# Patient Record
Sex: Female | Born: 1955
Health system: Southern US, Community
[De-identification: ages and names within clinical notes are randomized; demographics above are authoritative.]

## PROBLEM LIST (undated history)

## (undated) DIAGNOSIS — J449 Chronic obstructive pulmonary disease, unspecified: Secondary | ICD-10-CM

## (undated) DIAGNOSIS — E11319 Type 2 diabetes mellitus with unspecified diabetic retinopathy without macular edema: Secondary | ICD-10-CM

## (undated) DIAGNOSIS — E119 Type 2 diabetes mellitus without complications: Secondary | ICD-10-CM

## (undated) DIAGNOSIS — N19 Unspecified kidney failure: Secondary | ICD-10-CM

## (undated) DIAGNOSIS — E049 Nontoxic goiter, unspecified: Secondary | ICD-10-CM

## (undated) DIAGNOSIS — Z8489 Family history of other specified conditions: Secondary | ICD-10-CM

## (undated) DIAGNOSIS — H35039 Hypertensive retinopathy, unspecified eye: Secondary | ICD-10-CM

## (undated) DIAGNOSIS — H269 Unspecified cataract: Secondary | ICD-10-CM

## (undated) DIAGNOSIS — I10 Essential (primary) hypertension: Secondary | ICD-10-CM

## (undated) DIAGNOSIS — K219 Gastro-esophageal reflux disease without esophagitis: Secondary | ICD-10-CM

## (undated) DIAGNOSIS — E8770 Fluid overload, unspecified: Secondary | ICD-10-CM

## (undated) DIAGNOSIS — J45909 Unspecified asthma, uncomplicated: Secondary | ICD-10-CM

## (undated) DIAGNOSIS — D649 Anemia, unspecified: Secondary | ICD-10-CM

## (undated) DIAGNOSIS — M1990 Unspecified osteoarthritis, unspecified site: Secondary | ICD-10-CM

## (undated) HISTORY — PX: CHOLECYSTECTOMY: SHX55

## (undated) HISTORY — DX: Unspecified cataract: H26.9

## (undated) HISTORY — DX: Hypertensive retinopathy, unspecified eye: H35.039

## (undated) HISTORY — DX: Type 2 diabetes mellitus with unspecified diabetic retinopathy without macular edema: E11.319

## (undated) HISTORY — PX: ABDOMINAL HYSTERECTOMY: SHX81

---

## 2010-01-04 ENCOUNTER — Encounter: Payer: Self-pay | Admitting: Cardiology

## 2010-01-05 ENCOUNTER — Encounter: Payer: Self-pay | Admitting: Cardiology

## 2010-01-21 ENCOUNTER — Encounter (INDEPENDENT_AMBULATORY_CARE_PROVIDER_SITE_OTHER): Payer: Self-pay | Admitting: *Deleted

## 2010-01-21 ENCOUNTER — Ambulatory Visit: Payer: Self-pay | Admitting: Cardiology

## 2010-01-21 DIAGNOSIS — I10 Essential (primary) hypertension: Secondary | ICD-10-CM | POA: Insufficient documentation

## 2010-01-21 DIAGNOSIS — R079 Chest pain, unspecified: Secondary | ICD-10-CM | POA: Insufficient documentation

## 2010-01-21 DIAGNOSIS — E782 Mixed hyperlipidemia: Secondary | ICD-10-CM | POA: Insufficient documentation

## 2010-01-21 DIAGNOSIS — J45909 Unspecified asthma, uncomplicated: Secondary | ICD-10-CM | POA: Insufficient documentation

## 2010-01-26 ENCOUNTER — Encounter (INDEPENDENT_AMBULATORY_CARE_PROVIDER_SITE_OTHER): Payer: Self-pay | Admitting: *Deleted

## 2010-02-03 ENCOUNTER — Encounter: Payer: Self-pay | Admitting: Cardiology

## 2010-02-03 ENCOUNTER — Ambulatory Visit: Payer: Self-pay | Admitting: Cardiology

## 2010-02-04 ENCOUNTER — Encounter (INDEPENDENT_AMBULATORY_CARE_PROVIDER_SITE_OTHER): Payer: Self-pay | Admitting: *Deleted

## 2010-02-17 ENCOUNTER — Encounter: Payer: Self-pay | Admitting: Cardiology

## 2010-09-21 NOTE — Assessment & Plan Note (Signed)
Summary: NHP-REF: HOSPITALIST [place]   Visit Type:  Initial Consult Referring Provider:  Dr. Langley Gauss Primary Provider:  Dr. Emelda Fear   History of Present Illness: 55 year old woman referred for cardiology consultation. She was admitted to the hospitalist service at [facility] back in mid May with complaints of left arm discomfort, diaphoresis, and left-sided chest pain based on the available data. She ruled out for myocardial infarction with normal troponin levels and CK-MB levels. She was not seen in consultation by cardiology at that time, however was referred to see [place] for further outpatient assessment.  Review of a faxed copy of her ECG from May 16 finds sinus rhythm with nonspecific ST-T changes and decreased R wave progression.  In speaking with Ms. Wood today, she states that she developed a discomfort in her left wrist area, radiating up her arm, and ultimately experiencing left-sided chest discomfort and weakness. This occurred at rest, and concerned her to the point that she sought medical attention as detailed above. Since that time however she has not had same intensity symptoms, although occasionally a discomfort in her left wrist. She does have chronic dyspnea on exertion, reporting a long-standing history of asthma, trouble with heat, and also pollen. She reports that she has to use her nebulizer machine and inhalers on a regular basis.  She reports undergoing a standard treadmill test several years ago without obvious abnormalities. She has had no recent ischemic testing. She is functionally limited by her asthma, in fact disabled, and states that she would not be able to ambulate on a treadmill at this point with any endurance.  Preventive Screening-Counseling & Management  Alcohol-Tobacco     Smoking Status: quit     Year Quit: 1970  Current Medications (verified): 1)  Omeprazole 40 Mg Cpdr (Omeprazole) .... Take 1 Tablet By Mouth Once A Day 2)  Furosemide 40 Mg Tabs  (Furosemide) .... Take 1 Tablet By Mouth Once A Day 3)  Aspirin 81 Mg Chew (Aspirin) .... Take 1 Tablet By Mouth Once A Day 4)  Pravastatin Sodium 40 Mg Tabs (Pravastatin Sodium) .... Take 1 Tablet By Mouth Once A Day 5)  Diclofenac Sodium 75 Mg Tbec (Diclofenac Sodium) .... Take 1 Tablet By Mouth Two Times A Day 6)  Metformin Hcl 1000 Mg Tabs (Metformin Hcl) .... Take 1 Tablet By Mouth Two Times A Day 7)  Excedrin Migraine 250-250-65 Mg Tabs (Aspirin-Acetaminophen-Caffeine) .... As Needed 8)  Nitrostat 0.3 Mg Subl (Nitroglycerin) .... Use As Directed 9)  Lantus Solostar 100 Unit/ml Soln (Insulin Glargine) .... Use  As Directed 10)  Proair Hfa 108 (90 Base) Mcg/act Aers (Albuterol Sulfate) .... 2 Puffs Every Four Hours As Needed 11)  Albuterol Sulfate (2.5 Mg/26ml) 0.083% Nebu (Albuterol Sulfate) .... One Neb Tx Four Times A Day 12)  Novolog Mix 70/30 Flexpen 70-30 % Susp (Insulin Aspart Prot & Aspart) .... Use As Directed 13)  Ferrous Sulfate 325 (65 Fe) Mg Tabs (Ferrous Sulfate) .... Take 1 Tablet By Mouth Two Times A Day 14)  Alprazolam 0.5 Mg Tabs (Alprazolam) .... Take 1 Tablet By Mouth Three Times A Day As Needed Anxiety 15)  Drisdol 50000 Unit Caps (Ergocalciferol) .... Take One By Mouth Weekly 16)  Hydrocodone-Acetaminophen 5-500 Mg Tabs (Hydrocodone-Acetaminophen) .... Take 1 Tablet By Mouth Three Times A Day As Needed Pain  Allergies (verified): 1)  ! Niacin  Comments:  Nurse/Medical Assistant: The patient's medications and allergies were reviewed with the patient and were updated in the Medication and Allergy  Lists. Bottles reviewed.  Past History:  Past Medical History: Last updated: 01/20/2010 Anemia Diabetes Type 2 Hyperlipidemia Hypertension Gastric ulcer Migraines Asthma  Past Surgical History: Last updated: 01/20/2010 Cholecystectomy Hysterectomy  Family History: Last updated: 01/20/2010 Siblings: diabetes and hypertension Father: CAD Mother: CAD  Social  History: Last updated: 01/20/2010 Disabled  Married  Tobacco Use - No Alcohol Use - no Drug Use - no  Social History: Smoking Status:  quit  Review of Systems       The patient complains of chest pain, dyspnea on exertion, and peripheral edema.  The patient denies anorexia, fever, syncope, prolonged cough, headaches, hemoptysis, abdominal pain, melena, hematochezia, and severe indigestion/heartburn.         Otherwise reviewed and negative except as outlined.  Vital Signs:  Patient profile:   55 year old female Height:      62 inches Weight:      205 pounds BMI:     37.63 Pulse rate:   99 / minute BP sitting:   125 / 83  (left arm) Cuff size:   large  Vitals Entered By: Georgina Peer (January 21, 2010 10:15 AM)  Nutrition Counseling: Patient's BMI is greater than 25 and therefore counseled on weight management options.  Physical Exam  Additional Exam:  Obese woman in no acute distress without active chest pain. HEENT: Conjunctiva and lids are normal, oropharynx clear. Neck: Supple, no elevated JVP or bruits. Lungs: Clear auscultation although with diminished breath sounds and dry expiratory cough with deep inspiration. No active wheeze. Cardiac: Regular rate and rhythm, indistinct PMI, soft apical systolic murmur. Abdomen: Soft, nontender, bowel sounds present. Skin: Warm and dry. Extremities: Trace edema below the knees bilaterally, distal pulses are 1-2+. Musculoskeletal: No gross deformities. Neuropsychiatric: Alert and oriented x3, affect grossly appropriate.   Impression & Recommendations:  Problem # 1:  CHEST PAIN (ICD-786.50)  Patient indicates episode of left-sided chest pain and weakness, following the initial onset of left arm discomfort back in mid May as detailed above. She has had lesser intensity symptoms since that time on a baseline history of dyspnea on exertion with asthma. She does have a substantial cardiac risk factor profile including diabetes  mellitus, hypertension, and hyperlipidemia. She ruled out for myocardial infarction during hospitalization in May, and is referred now for further cardiac risk stratification. In light of her continued, although less intense symptoms, we discussed proceeding with additional objective testing, including both invasive and noninvasive techniques. We elected to proceed with a dobutamine Cardiolite as well as a 2-D echocardiogram for both ischemic and cardiac structural assessment. I will have her return to the office to discuss the results and we can proceed from there. At this point she will continue her present medications.  Her updated medication list for this problem includes:    Aspirin 81 Mg Chew (Aspirin) .Marland Kitchen... Take 1 tablet by mouth once a day    Nitrostat 0.3 Mg Subl (Nitroglycerin) ..... Use as directed  Orders: 2-D Echocardiogram (2D Echo) Nuclear Med (Nuc Med)  Problem # 2:  ASTHMA, UNSPECIFIED, UNSPECIFIED STATUS (ICD-493.90)  Apparent long-standing history, and functionally limiting. Patient states that she is disabled related to this. She doubts that she would be able to ambulate on a treadmill with any endurance, hence chemical stress testing as detailed above. She will otherwise continue her present pulmonary regimen on the direction of Dr. Chauncey Reading.  Her updated medication list for this problem includes:    Proair Hfa 108 (90 Base) Mcg/act Aers (  Albuterol sulfate) .Marland Kitchen... 2 puffs every four hours as needed    Albuterol Sulfate (2.5 Mg/61ml) 0.083% Nebu (Albuterol sulfate) ..... One neb tx four times a day  Orders: Nuclear Med ([place])  Problem # 3:  MIXED HYPERLIPIDEMIA (ICD-272.2)  Follow up with Dr. Chauncey Reading, tolerating pravastatin.  Her updated medication list for this problem includes:    Pravastatin Sodium 40 Mg Tabs (Pravastatin sodium) .Marland Kitchen... Take 1 tablet by mouth once a day  Orders: 2-D Echocardiogram (2D Echo) Nuclear Med (Nuc Med)  Problem # 4:  ESSENTIAL  HYPERTENSION, BENIGN (ICD-401.1)  Blood pressure reasonably well controlled today.  Her updated medication list for this problem includes:    Furosemide 40 Mg Tabs (Furosemide) .Marland Kitchen... Take 1 tablet by mouth once a day    Aspirin 81 Mg Chew (Aspirin) .Marland Kitchen... Take 1 tablet by mouth once a day  Orders: 2-D Echocardiogram (2D Echo) Nuclear Med ([place])  Patient Instructions: 1)  Your physician has requested that you have an echocardiogram.  Echocardiography is a painless test that uses sound waves to create images of your heart. It provides your doctor with information about the size and shape of your heart and how well your heart's chambers and valves are working.  This procedure takes approximately one hour. There are no restrictions for this procedure. If the results of your test are normal or stable, you will receive a letter. If they are abnormal, the nurse will contact you by phone.  2)  Your physician has requested that you have a dobutamine cardiolite.  For further information please visit [email or web address].  Please follow instruction sheet, as given. If the results of your test are normal or stable, you will receive a letter. If they are abnormal, the nurse will contact you by phone.  3)  Follow up appt with Dr. Domenic Polite on Wednesday, February 17, 2010 at 8:50am.

## 2010-09-21 NOTE — Letter (Signed)
Summary: Appointment -missed  [facility] [place]. [address]   [place], [place] [address]   Phone: [phone]  Fax: [phone]     February 17, 2010 MRN: [id number]     [facility]AR[facility] CALCOTE [address] [place]  [address]     Dear Ms. Chrissie Noa,  [facility] records indicate you missed your appointment on February 17, 2010                        with Dr.  Domenic Polite.   It is very important that we reach you to reschedule this appointment. We look forward to participating in your health care needs.   Please contact us at the number listed above at your earliest convenience to reschedule this appointment.   Sincerely,    Public relations account executive

## 2010-09-21 NOTE — Letter (Signed)
Summary: [facility], general practice at [place]. [address]   [place], [place] [address]   Phone: [phone]  Fax: [phone]    01/26/2010  [facility] [address] [place]  [address]  Dear Ms. Chrissie Noa,  I was unable to reach you by telephone. I have re-scheduled your test for February 03, 2010. Enclosed you will find your orders. If this date and time are not suitable please call our office and we will be happy to re-schedule.           Sincerely,   Delfino Lovett Patient Care Coordinator [phone] ext. [place]

## 2010-09-21 NOTE — Letter (Signed)
Summary: [facility] H&P/ D/C (DR. PARSONS)  [facility] H&P/ D/C (DR. PARSONS)   Imported By: Delfino Lovett 01/20/2010 12:13:19  _____________________________________________________________________  External Attachment:    Type:   Image     Comment:   External Document

## 2010-09-21 NOTE — Letter (Signed)
Summary: Risk analyst at [place]. [address] [place], [place] [address]   Phone: [phone]  Fax: [phone]        February 04, 2010 MRN: [id number]    [facility] [address] [place]  [address]    Dear Ms. Chrissie Noa,  Your test ordered by Rande Lawman has been reviewed by your physician (or physician assistant) and was found to be normal or stable. Your physician (or physician assistant) felt no changes were needed at this time.  __X__ Echocardiogram  __X__ Cardiac Stress Test-WE WILL DISCUSS RESULTS FURTHER AT YOUR FOLLOW UP VISIT IN 2 WKS.  ____ Lab Work  ____ Peripheral vascular study of arms, legs or neck  ____ CT scan or X-ray  ____ Lung or Breathing test  ____ Other:   Thank you.   Gurney Maxin, RN, BSN    Bryon Lions, M.D., F.A.C.C. Maceo Pro, M.D., F.A.C.C. Cammy Copa, M.D., F.A.C.C. Vonda Antigua, M.D., F.A.C.C. Vita Barley, M.D., F.A.C.C. Mare Ferrari, M.D., F.A.C.C. Emi Belfast, PA-C

## 2010-09-21 NOTE — Letter (Signed)
Summary: Lexiscan or Dobutamine Adult nurse at [place] [place]. [address]   [place], [place] [address]   Phone: [phone]  Fax: [phone]      [place] or Dobutamine Cardiolite Strss Test    [facility]  Appointment Date:_  Appointment Time:_  Your doctor has ordered a CARDIOLITE STRESS TEST using a medication to stimulate exercise so that you will not have to walk on the treadmill to determine the condition of your heart during stress. If you take blood pressure medication, ask your doctor if you should take it the day of your test. You should not have anything to eat or drink at least 4 hours before your test is scheduled, and no caffeine, including decaffeinated tea and coffee, chocolate, and soft drinks for 24 hours before your test.  You will need to register at the Outpatient/Main Entrance at the hospital 15 minutes before your appointment time. It is a good idea to bring a copy of your order with you. They will direct you to the Diagnostic Imaging (Radiology) Department.  You will be asked to undress from the waist up and given a hospital gown to wear, so dress comfortably from the waist down for example: Sweat pants, shorts, or skirt Rubber soled lace up shoes (tennis shoes)  Plan on about three hours from registration to release from the hospital  Hold furosemide (Lasix) and diabetic medications am of test.

## 2013-04-17 DIAGNOSIS — R0602 Shortness of breath: Secondary | ICD-10-CM

## 2014-03-05 ENCOUNTER — Emergency Department (HOSPITAL_COMMUNITY)
Admission: EM | Admit: 2014-03-05 | Discharge: 2014-03-05 | Disposition: A | Discharge status: Home / Self Care | Payer: Medicare HMO | Attending: Emergency Medicine | Admitting: Emergency Medicine

## 2014-03-05 ENCOUNTER — Encounter (HOSPITAL_COMMUNITY): Payer: Self-pay | Admitting: Emergency Medicine

## 2014-03-05 DIAGNOSIS — J45909 Unspecified asthma, uncomplicated: Secondary | ICD-10-CM | POA: Diagnosis not present

## 2014-03-05 DIAGNOSIS — H5789 Other specified disorders of eye and adnexa: Secondary | ICD-10-CM | POA: Diagnosis present

## 2014-03-05 DIAGNOSIS — H1130 Conjunctival hemorrhage, unspecified eye: Secondary | ICD-10-CM | POA: Diagnosis not present

## 2014-03-05 DIAGNOSIS — Z87448 Personal history of other diseases of urinary system: Secondary | ICD-10-CM | POA: Insufficient documentation

## 2014-03-05 DIAGNOSIS — I10 Essential (primary) hypertension: Secondary | ICD-10-CM | POA: Insufficient documentation

## 2014-03-05 DIAGNOSIS — Z862 Personal history of diseases of the blood and blood-forming organs and certain disorders involving the immune mechanism: Secondary | ICD-10-CM | POA: Insufficient documentation

## 2014-03-05 DIAGNOSIS — H1132 Conjunctival hemorrhage, left eye: Secondary | ICD-10-CM

## 2014-03-05 HISTORY — DX: Fluid overload, unspecified: E87.70

## 2014-03-05 HISTORY — DX: Unspecified kidney failure: N19

## 2014-03-05 HISTORY — DX: Unspecified asthma, uncomplicated: J45.909

## 2014-03-05 HISTORY — DX: Essential (primary) hypertension: I10

## 2014-03-05 HISTORY — DX: Anemia, unspecified: D64.9

## 2014-03-05 NOTE — Discharge Instructions (Signed)
Subconjunctival Hemorrhage A subconjunctival hemorrhage is a bright red patch covering a portion of the white of the eye. The white part of the eye is called the sclera, and it is covered by a thin membrane called the conjunctiva. This membrane is clear, except for tiny blood vessels that you can see with the naked eye. When your eye is irritated or inflamed and becomes red, it is because the vessels in the conjunctiva are swollen. Sometimes, a blood vessel in the conjunctiva can break and bleed. When this occurs, the blood builds up between the conjunctiva and the sclera, and spreads out to create a red area. The red spot may be very small at first. It may then spread to cover a larger part of the surface of the eye, or even all of the visible white part of the eye. In almost all cases, the blood will go away and the eye will become white again. Before completely dissolving, however, the red area may spread. It may also become brownish-yellow in color, before going away. If a lot of blood collects under the conjunctiva, it may look like a bulge on the surface of the eye. This looks scary, but it will also eventually flatten out and go away. Subconjunctival hemorrhages do not cause pain, but if swollen, may cause a feeling of irritation. There is no effect on vision.  CAUSES   The most common cause is mild trauma (rubbing the eye, irritation).  Subconjunctival hemorrhages can happen because of coughing or straining (lifting heavy objects), vomiting, or sneezing.  In some cases, your doctor may want to check your blood pressure. High blood pressure can also cause a sunconjunctival hemorrhage.  Severe trauma or blunt injuries.  Diseases that affect blood clotting (hemophilia, leukemia).  Abnormalities of blood vessels behind the eye (carotid cavernous sinus fistula).  Tumors behind the eye.  Certain drugs (aspirin, coumadin, heparin).  Recent eye surgery. HOME CARE INSTRUCTIONS   Do not worry  about the appearance of your eye. You may continue your usual activities.  Often, follow-up is not necessary. SEEK MEDICAL CARE IF:   Your eye becomes painful.  The bleeding does not disappear within 3 weeks.  Bleeding occurs elsewhere, for example, under the skin, in the mouth, or in the other eye.  You have recurring subconjunctival hemorrhages. SEEK IMMEDIATE MEDICAL CARE IF:   Your vision changes or you have difficulty seeing.  You develop severe headache, persistent vomiting, confusion, or abnormal drowsiness (lethargy).  Your eye seems to bulge or protrude from the eye socket.  You notice the sudden appearance of bruises, or have spontaneous bleeding elsewhere on your body. Document Released: 08/08/2005 Document Revised: 10/31/2011 Document Reviewed: 07/06/2009 [facility] Patient Information 2015 [place], [place]. This information is not intended to replace advice given to you by your health care provider. Make sure you discuss any questions you have with your health care provider.

## 2014-03-05 NOTE — ED Notes (Signed)
Sunday morning patient woke with left eye redness, progressively worse. Denies pain, blurred vision, itchy. Pt states feel "little different". Mild swelling left upper cheek.

## 2014-03-05 NOTE — ED Provider Notes (Signed)
TIME SEEN: 5:17 PM  CHIEF COMPLAINT: Left eye redness  HPI: Patient is a 58 y.o. F with history of hypertension, asthma who presents to the emergency department with complaints of left eye redness that started on Sunday, 3 days ago. She denies any eye pain, trauma to eye, vision changes including blurry vision or vision loss, headaches, nausea or vomiting. No drainage from the eye. She is not on anticoagulation. She is not sure what her blood pressure runs but it is 155/84 the emergency department. She states she has been stressed out recently and crying more than normal because her sister recently died.  ROS: See HPI Constitutional: no fever  Eyes: no drainage  ENT: no runny nose   Cardiovascular:  no chest pain  Resp: no SOB  GI: no vomiting GU: no dysuria Integumentary: no rash  Allergy: no hives  Musculoskeletal: no leg swelling  Neurological: no slurred speech ROS otherwise negative  PAST MEDICAL HISTORY/PAST SURGICAL HISTORY:  Past Medical History  Diagnosis Date  . Asthma   . Hypertension   . Anemia   . Fluid excess   . Renal failure     MEDICATIONS:  Prior to Admission medications   Not on File    ALLERGIES:  Allergies  Allergen Reactions  . Niacin     REACTION: burning    SOCIAL HISTORY:  History  Substance Use Topics  . Smoking status: Never Smoker   . Smokeless tobacco: Not on file  . Alcohol Use: No    FAMILY HISTORY: No family history on file.  EXAM: BP 155/84  Pulse 92  Temp(Src) 98.5 F (36.9 C) (Oral)  Resp 18  Ht 5\' 6"  (1.676 m)  Wt 205 lb (92.987 kg)  BMI 33.10 kg/m2  SpO2 100% CONSTITUTIONAL: Alert and oriented and responds appropriately to questions. Well-appearing; well-nourished HEAD: Normocephalic EYES: Conjunctivae clear, PERRL, extraocular movements intact and painless, no photophobia, no pain with consensual light response, patient has a subconjunctival hemorrhage of the left medial eye that is spreading underneath and above  the iris, no hyphema, normal visual fields ENT: normal nose; no rhinorrhea; moist mucous membranes; pharynx without lesions noted NECK: Supple, no meningismus, no LAD  CARD: RRR; S1 and S2 appreciated; no murmurs, no clicks, no rubs, no gallops RESP: Normal chest excursion without splinting or tachypnea; breath sounds clear and equal bilaterally; no wheezes, no rhonchi, no rales,  ABD/GI: Normal bowel sounds; non-distended; soft, non-tender, no rebound, no guarding BACK:  The back appears normal and is non-tender to palpation, there is no CVA tenderness EXT: Normal ROM in all joints; non-tender to palpation; no edema; normal capillary refill; no cyanosis    SKIN: Normal color for age and race; warm NEURO: Moves all extremities equally PSYCH: The patient's mood and manner are appropriate. Grooming and personal hygiene are appropriate.  MEDICAL DECISION MAKING: Patient here with subconjunctival hemorrhage. She is not on anticoagulation. Denies any eye pain, vision changes, headache, nausea or vomiting. Doubt glaucoma, retinal detachment, central retinal vessel occlusion. Doubt conjunctivitis. Doubt corneal abrasion or ulcer given she has no eye pain. She only wears reading glasses. No contacts. Have discussed with patient that this will take time to improve. Her blood pressure here in the emergency Department stable. She has not had any trauma to eye. A sign of globe injury on exam. No hyphema. I feel she is safe to be discharged home. We'll give ophthalmology followup as needed. Have discussed supportive care instructions and strict return precautions. Patient verbalizes understanding  and is comfortable with plan.    [place], DO 03/05/14 1725

## 2014-03-05 NOTE — ED Notes (Addendum)
Pt denies any known injury,fever, or changes in vision.

## 2014-05-25 ENCOUNTER — Emergency Department (HOSPITAL_COMMUNITY): Payer: Commercial Managed Care - HMO

## 2014-05-25 ENCOUNTER — Encounter (HOSPITAL_COMMUNITY): Payer: Self-pay | Admitting: Emergency Medicine

## 2014-05-25 ENCOUNTER — Emergency Department (HOSPITAL_COMMUNITY)
Admission: EM | Admit: 2014-05-25 | Discharge: 2014-05-26 | Disposition: A | Discharge status: Home / Self Care | Payer: Commercial Managed Care - HMO | Attending: Emergency Medicine | Admitting: Emergency Medicine

## 2014-05-25 DIAGNOSIS — Z79899 Other long term (current) drug therapy: Secondary | ICD-10-CM | POA: Diagnosis not present

## 2014-05-25 DIAGNOSIS — R110 Nausea: Secondary | ICD-10-CM | POA: Diagnosis not present

## 2014-05-25 DIAGNOSIS — R600 Localized edema: Secondary | ICD-10-CM | POA: Diagnosis not present

## 2014-05-25 DIAGNOSIS — Z87448 Personal history of other diseases of urinary system: Secondary | ICD-10-CM | POA: Insufficient documentation

## 2014-05-25 DIAGNOSIS — D649 Anemia, unspecified: Secondary | ICD-10-CM | POA: Insufficient documentation

## 2014-05-25 DIAGNOSIS — Z7982 Long term (current) use of aspirin: Secondary | ICD-10-CM | POA: Insufficient documentation

## 2014-05-25 DIAGNOSIS — Z794 Long term (current) use of insulin: Secondary | ICD-10-CM | POA: Diagnosis not present

## 2014-05-25 DIAGNOSIS — I10 Essential (primary) hypertension: Secondary | ICD-10-CM | POA: Diagnosis not present

## 2014-05-25 DIAGNOSIS — E119 Type 2 diabetes mellitus without complications: Secondary | ICD-10-CM | POA: Diagnosis not present

## 2014-05-25 DIAGNOSIS — J4521 Mild intermittent asthma with (acute) exacerbation: Secondary | ICD-10-CM | POA: Diagnosis not present

## 2014-05-25 DIAGNOSIS — R0602 Shortness of breath: Secondary | ICD-10-CM | POA: Diagnosis present

## 2014-05-25 DIAGNOSIS — H539 Unspecified visual disturbance: Secondary | ICD-10-CM | POA: Insufficient documentation

## 2014-05-25 LAB — CBC WITH DIFFERENTIAL/PLATELET
Basophils Absolute: 0 10*3/uL (ref 0.0–0.1)
Basophils Relative: 0 % (ref 0–1)
Eosinophils Absolute: 0.1 10*3/uL (ref 0.0–0.7)
Eosinophils Relative: 1 % (ref 0–5)
HEMATOCRIT: 32.1 % — AB (ref 36.0–46.0)
HEMOGLOBIN: 11 g/dL — AB (ref 12.0–15.0)
LYMPHS PCT: 48 % — AB (ref 12–46)
Lymphs Abs: 3.7 10*3/uL (ref 0.7–4.0)
MCH: 27 pg (ref 26.0–34.0)
MCHC: 34.3 g/dL (ref 30.0–36.0)
MCV: 78.7 fL (ref 78.0–100.0)
MONO ABS: 0.3 10*3/uL (ref 0.1–1.0)
MONOS PCT: 3 % (ref 3–12)
NEUTROS ABS: 3.6 10*3/uL (ref 1.7–7.7)
Neutrophils Relative %: 48 % (ref 43–77)
Platelets: 252 10*3/uL (ref 150–400)
RBC: 4.08 MIL/uL (ref 3.87–5.11)
RDW: 13.5 % (ref 11.5–15.5)
WBC: 7.6 10*3/uL (ref 4.0–10.5)

## 2014-05-25 LAB — BASIC METABOLIC PANEL
Anion gap: 12 (ref 5–15)
BUN: 19 mg/dL (ref 6–23)
CO2: 25 meq/L (ref 19–32)
CREATININE: 1.41 mg/dL — AB (ref 0.50–1.10)
Calcium: 9.3 mg/dL (ref 8.4–10.5)
Chloride: 100 mEq/L (ref 96–112)
GFR calc Af Amer: 47 mL/min — ABNORMAL LOW (ref >90)
GFR calc non Af Amer: 41 mL/min — ABNORMAL LOW (ref >90)
Glucose, Bld: 253 mg/dL — ABNORMAL HIGH (ref 70–99)
Potassium: 3.6 mEq/L — ABNORMAL LOW (ref 3.7–5.3)
Sodium: 137 mEq/L (ref 137–147)

## 2014-05-25 LAB — CBG MONITORING, ED: Glucose-Capillary: 285 mg/dL — ABNORMAL HIGH (ref 70–99)

## 2014-05-25 MED ORDER — PREDNISONE 10 MG PO TABS: 40.0000 mg | ORAL_TABLET | Freq: Every day | ORAL | Status: DC

## 2014-05-25 MED ORDER — PREDNISONE 50 MG PO TABS
60.0000 mg | ORAL_TABLET | Freq: Once | ORAL | Status: AC
  Administered 2014-05-25: 60 mg via ORAL
  Filled 2014-05-25 (×2): qty 1

## 2014-05-25 NOTE — ED Provider Notes (Signed)
CSN: [id number]     Arrival date & time 05/25/14  1952 History  This chart was scribed for Fredia Sorrow, MD by Molli Posey, ED Scribe. This patient was seen in room APA06/[facility]06 and the patient's care was started 9:41 PM.   Chief Complaint  Patient presents with  . Shortness of Breath     The history is provided by the patient. No language interpreter was used.   HPI Comments: Cindy Park is a 58 y.o. female with a history of asthma and insulin dependent DM who presents to the Emergency Department complaining of shortness of breath for the past 2 weeks. The patient reports having subjective fevers and chills and an unproductive cough. She reports nausea and light headedness as associated symptoms that started today. She states that she has a nebulizer and a puffer for her albuterol prescription at home. She reports she has taken Prednisone for similar, previous events and that it provided relief to her symptoms. She denies diarrhea and vomiting.      Past Medical History  Diagnosis Date  . Asthma   . Hypertension   . Anemia   . Fluid excess   . Renal failure    Past Surgical History  Procedure Laterality Date  . Cholecystectomy    . Abdominal hysterectomy     History reviewed. No pertinent family history. History  Substance Use Topics  . Smoking status: Never Smoker   . Smokeless tobacco: Not on file  . Alcohol Use: No   OB History   Grav Para Term Preterm Abortions TAB SAB Ect Mult Living                 Review of Systems  Constitutional: Positive for fever and chills.  HENT: Negative for rhinorrhea and sore throat.   Eyes: Positive for visual disturbance.  Respiratory: Positive for cough and shortness of breath.   Cardiovascular: Positive for leg swelling. Negative for chest pain.  Gastrointestinal: Positive for nausea. Negative for vomiting, abdominal pain and diarrhea.  Genitourinary: Negative for dysuria.  Musculoskeletal: Negative for back pain and  neck pain.  Skin: Negative for rash.  Neurological: Positive for headaches.  Hematological: Does not bruise/bleed easily.  Psychiatric/Behavioral: Negative for confusion.      Allergies  Niacin  Home Medications   Prior to Admission medications   Medication Sig Start Date End Date Taking? Authorizing Provider  albuterol (PROVENTIL) (2.5 MG/3ML) 0.083% nebulizer solution Take 2.5 mg by nebulization 4 (four) times daily.   Yes Historical Provider, MD  aspirin EC 81 MG tablet Take 81 mg by mouth daily.   Yes Historical Provider, MD  atorvastatin (LIPITOR) 40 MG tablet Take 40 mg by mouth at bedtime.   Yes Historical Provider, MD  dicyclomine (BENTYL) 10 MG capsule Take 10 mg by mouth daily.   Yes Historical Provider, MD  fenofibrate (TRICOR) 145 MG tablet Take 145 mg by mouth daily.   Yes Historical Provider, MD  ferrous sulfate 325 (65 FE) MG tablet Take 325 mg by mouth 2 (two) times daily with a meal.   Yes Historical Provider, MD  furosemide (LASIX) 40 MG tablet Take 40 mg by mouth every Monday, Wednesday, Friday, and Saturday at 6 PM.   Yes Historical Provider, MD  gabapentin (NEURONTIN) 300 MG capsule Take 300 mg by mouth 3 (three) times daily.   Yes Historical Provider, MD  glipiZIDE (GLUCOTROL) 10 MG tablet Take 10 mg by mouth 2 (two) times daily.   Yes Historical Provider, MD  insulin aspart protamine- aspart (NOVOLOG MIX 70/30) (70-30) 100 UNIT/ML injection Inject 30 Units into the skin 2 (two) times daily.   Yes Historical Provider, MD  insulin detemir (LEVEMIR) 100 UNIT/ML injection Inject 45 Units into the skin at bedtime.   Yes Historical Provider, MD  losartan (COZAAR) 100 MG tablet Take 100 mg by mouth daily.   Yes Historical Provider, MD  Magnesium Hydroxide (MAGNESIA PO) Take 1 tablet by mouth daily.   Yes Historical Provider, MD  montelukast (SINGULAIR) 10 MG tablet Take 10 mg by mouth at bedtime.   Yes Historical Provider, MD  omeprazole (PRILOSEC) 40 MG capsule Take 40  mg by mouth daily.   Yes Historical Provider, MD  potassium chloride (K-DUR) 10 MEQ tablet Take 20 mEq by mouth daily.   Yes Historical Provider, MD  tiotropium (SPIRIVA) 18 MCG inhalation capsule Place 18 mcg into inhaler and inhale daily.   Yes Historical Provider, MD  Vitamin D, Ergocalciferol, (DRISDOL) 50000 UNITS CAPS capsule Take 50,000 Units by mouth every 7 (seven) days. Takes on Sunday   Yes Historical Provider, MD  predniSONE (DELTASONE) 10 MG tablet Take 4 tablets (40 mg total) by mouth daily. 05/25/14   Fredia Sorrow, MD   BP 151/79  Pulse 85  Temp(Src) 98.5 F (36.9 C) (Oral)  Resp 24  Ht 5\' 7"  (1.702 m)  Wt 225 lb 1.6 oz (102.105 kg)  BMI 35.25 kg/m2  SpO2 99% Physical Exam  Nursing note and vitals reviewed. Constitutional: She is oriented to person, place, and time. She appears well-developed and well-nourished.  HENT:  Head: Normocephalic and atraumatic.  Cardiovascular: Normal rate, regular rhythm and normal heart sounds.   Pulmonary/Chest: Effort normal and breath sounds normal. No respiratory distress. She has no wheezes. She has no rales.  Abdominal: Bowel sounds are normal. She exhibits no distension. There is no tenderness.  Musculoskeletal: Normal range of motion. She exhibits edema.  Neurological: She is alert and oriented to person, place, and time.  Skin: Skin is warm and dry.  Psychiatric: She has a normal mood and affect.    ED Course  Procedures  DIAGNOSTIC STUDIES: Oxygen Saturation is 98% on RA, normal by my interpretation.    COORDINATION OF CARE: 9:47 PM Discussed treatment plan with pt at bedside and pt agreed to plan.   Labs Review Labs Reviewed  BASIC METABOLIC PANEL - Abnormal; Notable for the following:    Potassium 3.6 (*)    Glucose, Bld 253 (*)    Creatinine, Ser 1.41 (*)    GFR calc non Af Amer 41 (*)    GFR calc Af Amer 47 (*)    All other components within normal limits  CBC WITH DIFFERENTIAL - Abnormal; Notable for the  following:    Hemoglobin 11.0 (*)    HCT 32.1 (*)    Lymphocytes Relative 48 (*)    All other components within normal limits  CBG MONITORING, ED - Abnormal; Notable for the following:    Glucose-Capillary 285 (*)    All other components within normal limits   Results for orders placed during the hospital encounter of [id number]  BASIC METABOLIC PANEL      Result Value Ref Range   Sodium 137  137 - 147 mEq/L   Potassium 3.6 (*) 3.7 - 5.3 mEq/L   Chloride 100  96 - 112 mEq/L   CO2 25  19 - 32 mEq/L   Glucose, Bld 253 (*) 70 - 99 mg/dL   BUN 19  6 - 23 mg/dL   Creatinine, Ser 1.41 (*) 0.50 - 1.10 mg/dL   Calcium 9.3  8.4 - 10.5 mg/dL   GFR calc non Af Amer 41 (*) >90 mL/min   GFR calc Af Amer 47 (*) >90 mL/min   Anion gap 12  5 - 15  CBC WITH DIFFERENTIAL      Result Value Ref Range   WBC 7.6  4.0 - 10.5 K/uL   RBC 4.08  3.87 - 5.11 MIL/uL   Hemoglobin 11.0 (*) 12.0 - 15.0 g/dL   HCT 32.1 (*) 36.0 - 46.0 %   MCV 78.7  78.0 - 100.0 fL   MCH 27.0  26.0 - 34.0 pg   MCHC 34.3  30.0 - 36.0 g/dL   RDW 13.5  11.5 - 15.5 %   Platelets 252  150 - 400 K/uL   Neutrophils Relative % 48  43 - 77 %   Neutro Abs 3.6  1.7 - 7.7 K/uL   Lymphocytes Relative 48 (*) 12 - 46 %   Lymphs Abs 3.7  0.7 - 4.0 K/uL   Monocytes Relative 3  3 - 12 %   Monocytes Absolute 0.3  0.1 - 1.0 K/uL   Eosinophils Relative 1  0 - 5 %   Eosinophils Absolute 0.1  0.0 - 0.7 K/uL   Basophils Relative 0  0 - 1 %   Basophils Absolute 0.0  0.0 - 0.1 K/uL  CBG MONITORING, ED      Result Value Ref Range   Glucose-Capillary 285 (*) 70 - 99 mg/dL   Comment 1 Documented in Chart       Imaging Review Dg Chest 2 View  05/25/2014   CLINICAL DATA:  Shortness of breath with cough and sore throat as well as intermittent fever 2 weeks.  EXAM: CHEST  2 VIEW  COMPARISON:  09/03/2013 and 04/17/2013  FINDINGS: Lungs are hypoinflated with minimal linear density over the left midlung unchanged compatible with scarring. No evidence  of effusion. Cardiomediastinal silhouette is within normal. There is minimal calcified plaque over the thoracic aorta. There is mild degenerative change of the spine.  IMPRESSION: No active cardiopulmonary disease.   Electronically Signed   By: Marin Olp M.D.   On: 05/25/2014 21:02     EKG Interpretation None      MDM   Final diagnoses:  Asthma, mild intermittent, with acute exacerbation   Patient with history of diabetes and asthma. Patient without any wheezing here but by her description at home it sounds as if there's been an exacerbation of her asthma. Patient does have albuterol nebulizer albuterol treatments available at home. Will start patient on prednisone for she says when she feels like this usually helps. Patient is aware that this could cause a bump in her blood sugar. Blood sugar here tonight to 250 range not significantly elevated but the prednisone could increase that. Patient will followup with her record Dr. in the next few days. Patient will follow her blood sugars carefully. Patient will return for any newer worse symptoms. Chest x-ray negative no signs of pneumonia no signs of pulmonary edema or pleural effusion.   I personally performed the services described in this documentation, which was scribed in my presence. The recorded information has been reviewed and is accurate.     Fredia Sorrow, MD 05/26/14 0000

## 2014-05-25 NOTE — Discharge Instructions (Signed)
Workup negative except for your feelings shortness of breath which she felt was related to your asthma. Continue albuterol treatments every 6 hours. We'll start you on prednisone first dose provided here. Be aware that prednisone will probably bump your blood sugars. Follow them carefully. Make an appointment to followup with your regular Dr. in [place] in the next few days. Return for any newer worse symptoms.

## 2014-05-25 NOTE — ED Notes (Signed)
Pt c/o sob with cough and feeling of hotness and coldness x 2 weeks

## 2014-05-26 NOTE — ED Notes (Signed)
Pt alert & oriented x4, stable gait. Patient given discharge instructions, paperwork & prescription(s). Patient  instructed to stop at the registration desk to finish any additional paperwork. Patient verbalized understanding. Pt left department w/ no further questions. 

## 2014-06-23 ENCOUNTER — Telehealth: Payer: Self-pay | Admitting: Family Medicine

## 2014-06-23 NOTE — Telephone Encounter (Signed)
Pt given appt with dr Sabra Heck 08/27/13 for new pt to establish care

## 2014-08-27 ENCOUNTER — Ambulatory Visit: Payer: Self-pay | Admitting: Family Medicine

## 2015-01-30 ENCOUNTER — Ambulatory Visit: Payer: Commercial Managed Care - HMO | Admitting: *Deleted

## 2015-03-04 ENCOUNTER — Ambulatory Visit: Payer: Commercial Managed Care - HMO | Admitting: *Deleted

## 2015-03-12 ENCOUNTER — Ambulatory Visit: Payer: Commercial Managed Care - HMO | Admitting: *Deleted

## 2015-04-09 ENCOUNTER — Ambulatory Visit: Payer: Commercial Managed Care - HMO | Admitting: *Deleted

## 2019-06-21 ENCOUNTER — Other Ambulatory Visit: Payer: Self-pay

## 2019-06-21 DIAGNOSIS — Z20822 Contact with and (suspected) exposure to covid-19: Secondary | ICD-10-CM

## 2019-06-22 LAB — NOVEL CORONAVIRUS, NAA: SARS-CoV-2, NAA: NOT DETECTED

## 2019-07-12 DIAGNOSIS — N1832 Chronic kidney disease, stage 3b: Secondary | ICD-10-CM | POA: Insufficient documentation

## 2019-07-12 DIAGNOSIS — E1122 Type 2 diabetes mellitus with diabetic chronic kidney disease: Secondary | ICD-10-CM | POA: Insufficient documentation

## 2019-07-24 ENCOUNTER — Other Ambulatory Visit (HOSPITAL_COMMUNITY): Payer: Self-pay | Admitting: Nephrology

## 2019-07-24 DIAGNOSIS — R809 Proteinuria, unspecified: Secondary | ICD-10-CM

## 2019-07-24 DIAGNOSIS — N189 Chronic kidney disease, unspecified: Secondary | ICD-10-CM

## 2019-08-01 ENCOUNTER — Encounter (HOSPITAL_COMMUNITY): Payer: Self-pay

## 2019-08-01 ENCOUNTER — Ambulatory Visit (HOSPITAL_COMMUNITY): Payer: Medicare HMO | Attending: Nephrology

## 2019-08-20 ENCOUNTER — Other Ambulatory Visit: Payer: Self-pay

## 2019-08-20 ENCOUNTER — Ambulatory Visit (HOSPITAL_COMMUNITY)
Admission: RE | Admit: 2019-08-20 | Discharge: 2019-08-20 | Disposition: A | Discharge status: Home / Self Care | Payer: Medicare HMO | Source: Ambulatory Visit | Attending: Nephrology | Admitting: Nephrology

## 2019-08-20 DIAGNOSIS — R809 Proteinuria, unspecified: Secondary | ICD-10-CM | POA: Diagnosis present

## 2019-08-20 DIAGNOSIS — N189 Chronic kidney disease, unspecified: Secondary | ICD-10-CM

## 2019-08-28 DIAGNOSIS — E211 Secondary hyperparathyroidism, not elsewhere classified: Secondary | ICD-10-CM | POA: Diagnosis not present

## 2019-08-28 DIAGNOSIS — E559 Vitamin D deficiency, unspecified: Secondary | ICD-10-CM | POA: Diagnosis not present

## 2019-08-28 DIAGNOSIS — R809 Proteinuria, unspecified: Secondary | ICD-10-CM | POA: Diagnosis not present

## 2019-08-28 DIAGNOSIS — N189 Chronic kidney disease, unspecified: Secondary | ICD-10-CM | POA: Diagnosis not present

## 2019-08-28 DIAGNOSIS — E872 Acidosis: Secondary | ICD-10-CM | POA: Diagnosis not present

## 2019-09-09 ENCOUNTER — Other Ambulatory Visit: Payer: Self-pay

## 2019-09-09 ENCOUNTER — Encounter (HOSPITAL_COMMUNITY)
Admission: RE | Admit: 2019-09-09 | Discharge: 2019-09-09 | Disposition: A | Discharge status: Home / Self Care | Payer: Medicare Other | Source: Ambulatory Visit | Attending: Nephrology | Admitting: Nephrology

## 2019-09-09 ENCOUNTER — Encounter (HOSPITAL_COMMUNITY): Payer: Self-pay

## 2019-09-09 DIAGNOSIS — D631 Anemia in chronic kidney disease: Secondary | ICD-10-CM | POA: Diagnosis not present

## 2019-09-09 DIAGNOSIS — N184 Chronic kidney disease, stage 4 (severe): Secondary | ICD-10-CM | POA: Insufficient documentation

## 2019-09-09 HISTORY — DX: Gastro-esophageal reflux disease without esophagitis: K21.9

## 2019-09-09 HISTORY — DX: Nontoxic goiter, unspecified: E04.9

## 2019-09-09 HISTORY — DX: Type 2 diabetes mellitus without complications: E11.9

## 2019-09-09 HISTORY — DX: Unspecified osteoarthritis, unspecified site: M19.90

## 2019-09-09 HISTORY — DX: Chronic obstructive pulmonary disease, unspecified: J44.9

## 2019-09-09 LAB — POCT HEMOGLOBIN-HEMACUE: Hemoglobin: 8.9 g/dL — ABNORMAL LOW (ref 12.0–15.0)

## 2019-09-09 MED ORDER — EPOETIN ALFA 3000 UNIT/ML IJ SOLN
3000.0000 [IU] | INTRAMUSCULAR | Status: DC
  Administered 2019-09-09: 3000 [IU] via SUBCUTANEOUS
  Filled 2019-09-09: qty 1

## 2019-09-09 NOTE — Discharge Instructions (Signed)

## 2019-09-10 DIAGNOSIS — Z87891 Personal history of nicotine dependence: Secondary | ICD-10-CM | POA: Diagnosis not present

## 2019-09-10 DIAGNOSIS — Z299 Encounter for prophylactic measures, unspecified: Secondary | ICD-10-CM | POA: Diagnosis not present

## 2019-09-10 DIAGNOSIS — E11649 Type 2 diabetes mellitus with hypoglycemia without coma: Secondary | ICD-10-CM | POA: Diagnosis not present

## 2019-09-10 DIAGNOSIS — I10 Essential (primary) hypertension: Secondary | ICD-10-CM | POA: Diagnosis not present

## 2019-09-23 ENCOUNTER — Encounter (HOSPITAL_COMMUNITY)
Admission: RE | Admit: 2019-09-23 | Discharge: 2019-09-23 | Disposition: A | Discharge status: Home / Self Care | Payer: Medicare Other | Source: Ambulatory Visit | Attending: Nephrology | Admitting: Nephrology

## 2019-09-23 ENCOUNTER — Other Ambulatory Visit: Payer: Self-pay

## 2019-09-23 ENCOUNTER — Encounter (HOSPITAL_COMMUNITY): Payer: Self-pay

## 2019-09-23 DIAGNOSIS — N184 Chronic kidney disease, stage 4 (severe): Secondary | ICD-10-CM | POA: Diagnosis not present

## 2019-09-23 DIAGNOSIS — D631 Anemia in chronic kidney disease: Secondary | ICD-10-CM | POA: Diagnosis not present

## 2019-09-23 LAB — POCT HEMOGLOBIN-HEMACUE: Hemoglobin: 9.5 g/dL — ABNORMAL LOW (ref 12.0–15.0)

## 2019-09-23 MED ORDER — EPOETIN ALFA 3000 UNIT/ML IJ SOLN
3000.0000 [IU] | Freq: Once | INTRAMUSCULAR | Status: AC
  Administered 2019-09-23: 3000 [IU] via SUBCUTANEOUS

## 2019-09-23 MED ORDER — EPOETIN ALFA 3000 UNIT/ML IJ SOLN
INTRAMUSCULAR | Status: AC
  Filled 2019-09-23: qty 1

## 2019-09-25 DIAGNOSIS — E119 Type 2 diabetes mellitus without complications: Secondary | ICD-10-CM | POA: Diagnosis not present

## 2019-10-01 DIAGNOSIS — I10 Essential (primary) hypertension: Secondary | ICD-10-CM | POA: Diagnosis not present

## 2019-10-01 DIAGNOSIS — E1165 Type 2 diabetes mellitus with hyperglycemia: Secondary | ICD-10-CM | POA: Diagnosis not present

## 2019-10-01 DIAGNOSIS — R079 Chest pain, unspecified: Secondary | ICD-10-CM | POA: Diagnosis not present

## 2019-10-01 DIAGNOSIS — R0602 Shortness of breath: Secondary | ICD-10-CM | POA: Diagnosis not present

## 2019-10-01 DIAGNOSIS — M25511 Pain in right shoulder: Secondary | ICD-10-CM | POA: Diagnosis not present

## 2019-10-01 DIAGNOSIS — J449 Chronic obstructive pulmonary disease, unspecified: Secondary | ICD-10-CM | POA: Diagnosis not present

## 2019-10-01 DIAGNOSIS — Z299 Encounter for prophylactic measures, unspecified: Secondary | ICD-10-CM | POA: Diagnosis not present

## 2019-10-07 ENCOUNTER — Encounter (HOSPITAL_COMMUNITY): Payer: Self-pay

## 2019-10-07 ENCOUNTER — Encounter (HOSPITAL_COMMUNITY)
Admission: RE | Admit: 2019-10-07 | Discharge: 2019-10-07 | Disposition: A | Discharge status: Home / Self Care | Payer: Medicare Other | Source: Ambulatory Visit | Attending: Nephrology | Admitting: Nephrology

## 2019-10-07 ENCOUNTER — Other Ambulatory Visit: Payer: Self-pay

## 2019-10-07 DIAGNOSIS — N184 Chronic kidney disease, stage 4 (severe): Secondary | ICD-10-CM | POA: Diagnosis not present

## 2019-10-07 DIAGNOSIS — D631 Anemia in chronic kidney disease: Secondary | ICD-10-CM | POA: Diagnosis not present

## 2019-10-07 LAB — POCT HEMOGLOBIN-HEMACUE: Hemoglobin: 10.2 g/dL — ABNORMAL LOW (ref 12.0–15.0)

## 2019-10-07 MED ORDER — EPOETIN ALFA 3000 UNIT/ML IJ SOLN: 3000.0000 [IU] | Freq: Once | INTRAMUSCULAR | Status: DC

## 2019-10-21 ENCOUNTER — Encounter (HOSPITAL_COMMUNITY)
Admission: RE | Admit: 2019-10-21 | Discharge: 2019-10-21 | Disposition: A | Discharge status: Home / Self Care | Payer: Medicare Other | Source: Ambulatory Visit | Attending: Nephrology | Admitting: Nephrology

## 2019-10-21 ENCOUNTER — Other Ambulatory Visit: Payer: Self-pay

## 2019-10-21 DIAGNOSIS — N184 Chronic kidney disease, stage 4 (severe): Secondary | ICD-10-CM | POA: Diagnosis not present

## 2019-10-21 DIAGNOSIS — D631 Anemia in chronic kidney disease: Secondary | ICD-10-CM | POA: Diagnosis not present

## 2019-10-21 LAB — POCT HEMOGLOBIN-HEMACUE: Hemoglobin: 9.6 g/dL — ABNORMAL LOW (ref 12.0–15.0)

## 2019-10-21 MED ORDER — EPOETIN ALFA 3000 UNIT/ML IJ SOLN
3000.0000 [IU] | Freq: Once | INTRAMUSCULAR | Status: AC
  Administered 2019-10-21: 3000 [IU] via SUBCUTANEOUS
  Filled 2019-10-21: qty 1

## 2019-11-04 ENCOUNTER — Other Ambulatory Visit: Payer: Self-pay

## 2019-11-04 ENCOUNTER — Encounter (HOSPITAL_COMMUNITY)
Admission: RE | Admit: 2019-11-04 | Discharge: 2019-11-04 | Disposition: A | Discharge status: Home / Self Care | Payer: Medicare Other | Source: Ambulatory Visit | Attending: Nephrology | Admitting: Nephrology

## 2019-11-04 ENCOUNTER — Encounter (HOSPITAL_COMMUNITY): Payer: Self-pay

## 2019-11-04 DIAGNOSIS — N184 Chronic kidney disease, stage 4 (severe): Secondary | ICD-10-CM | POA: Diagnosis not present

## 2019-11-04 DIAGNOSIS — D631 Anemia in chronic kidney disease: Secondary | ICD-10-CM | POA: Diagnosis not present

## 2019-11-04 LAB — POCT HEMOGLOBIN-HEMACUE: Hemoglobin: 9.3 g/dL — ABNORMAL LOW (ref 12.0–15.0)

## 2019-11-04 MED ORDER — EPOETIN ALFA 3000 UNIT/ML IJ SOLN
3000.0000 [IU] | Freq: Once | INTRAMUSCULAR | Status: AC
  Administered 2019-11-04: 3000 [IU] via SUBCUTANEOUS
  Filled 2019-11-04: qty 1

## 2019-11-08 DIAGNOSIS — E211 Secondary hyperparathyroidism, not elsewhere classified: Secondary | ICD-10-CM | POA: Diagnosis not present

## 2019-11-08 DIAGNOSIS — N189 Chronic kidney disease, unspecified: Secondary | ICD-10-CM | POA: Diagnosis not present

## 2019-11-08 DIAGNOSIS — R809 Proteinuria, unspecified: Secondary | ICD-10-CM | POA: Diagnosis not present

## 2019-11-08 DIAGNOSIS — E1122 Type 2 diabetes mellitus with diabetic chronic kidney disease: Secondary | ICD-10-CM | POA: Diagnosis not present

## 2019-11-08 DIAGNOSIS — E559 Vitamin D deficiency, unspecified: Secondary | ICD-10-CM | POA: Diagnosis not present

## 2019-11-13 DIAGNOSIS — N189 Chronic kidney disease, unspecified: Secondary | ICD-10-CM | POA: Diagnosis not present

## 2019-11-13 DIAGNOSIS — E559 Vitamin D deficiency, unspecified: Secondary | ICD-10-CM | POA: Diagnosis not present

## 2019-11-13 DIAGNOSIS — E211 Secondary hyperparathyroidism, not elsewhere classified: Secondary | ICD-10-CM | POA: Diagnosis not present

## 2019-11-13 DIAGNOSIS — R809 Proteinuria, unspecified: Secondary | ICD-10-CM | POA: Diagnosis not present

## 2019-11-13 DIAGNOSIS — E872 Acidosis: Secondary | ICD-10-CM | POA: Diagnosis not present

## 2019-11-18 ENCOUNTER — Encounter (HOSPITAL_COMMUNITY)
Admission: RE | Admit: 2019-11-18 | Discharge: 2019-11-18 | Disposition: A | Discharge status: Home / Self Care | Payer: Medicare Other | Source: Ambulatory Visit | Attending: Nephrology | Admitting: Nephrology

## 2019-11-18 ENCOUNTER — Other Ambulatory Visit: Payer: Self-pay

## 2019-11-18 ENCOUNTER — Encounter (HOSPITAL_COMMUNITY): Payer: Self-pay

## 2019-11-18 DIAGNOSIS — D631 Anemia in chronic kidney disease: Secondary | ICD-10-CM | POA: Diagnosis not present

## 2019-11-18 DIAGNOSIS — N184 Chronic kidney disease, stage 4 (severe): Secondary | ICD-10-CM | POA: Diagnosis not present

## 2019-11-18 LAB — POCT HEMOGLOBIN-HEMACUE: Hemoglobin: 9 g/dL — ABNORMAL LOW (ref 12.0–15.0)

## 2019-11-18 MED ORDER — EPOETIN ALFA 3000 UNIT/ML IJ SOLN
3000.0000 [IU] | Freq: Once | INTRAMUSCULAR | Status: AC
  Administered 2019-11-18: 14:00:00 3000 [IU] via SUBCUTANEOUS
  Filled 2019-11-18: qty 1

## 2019-11-25 DIAGNOSIS — E119 Type 2 diabetes mellitus without complications: Secondary | ICD-10-CM | POA: Diagnosis not present

## 2019-11-25 DIAGNOSIS — I10 Essential (primary) hypertension: Secondary | ICD-10-CM | POA: Diagnosis not present

## 2019-11-25 DIAGNOSIS — J449 Chronic obstructive pulmonary disease, unspecified: Secondary | ICD-10-CM | POA: Diagnosis not present

## 2019-12-02 ENCOUNTER — Encounter (HOSPITAL_COMMUNITY): Payer: Self-pay

## 2019-12-02 ENCOUNTER — Encounter (HOSPITAL_COMMUNITY)
Admission: RE | Admit: 2019-12-02 | Discharge: 2019-12-02 | Disposition: A | Discharge status: Home / Self Care | Payer: Medicare Other | Source: Ambulatory Visit | Attending: Nephrology | Admitting: Nephrology

## 2019-12-02 ENCOUNTER — Other Ambulatory Visit: Payer: Self-pay

## 2019-12-02 DIAGNOSIS — N184 Chronic kidney disease, stage 4 (severe): Secondary | ICD-10-CM | POA: Insufficient documentation

## 2019-12-02 DIAGNOSIS — D631 Anemia in chronic kidney disease: Secondary | ICD-10-CM | POA: Insufficient documentation

## 2019-12-02 LAB — POCT HEMOGLOBIN-HEMACUE: Hemoglobin: 8.2 g/dL — ABNORMAL LOW (ref 12.0–15.0)

## 2019-12-02 MED ORDER — EPOETIN ALFA 3000 UNIT/ML IJ SOLN
3000.0000 [IU] | Freq: Once | INTRAMUSCULAR | Status: AC
  Administered 2019-12-02: 3000 [IU] via SUBCUTANEOUS
  Filled 2019-12-02: qty 1

## 2019-12-04 DIAGNOSIS — Z23 Encounter for immunization: Secondary | ICD-10-CM | POA: Diagnosis not present

## 2019-12-06 DIAGNOSIS — E1122 Type 2 diabetes mellitus with diabetic chronic kidney disease: Secondary | ICD-10-CM | POA: Diagnosis not present

## 2019-12-06 DIAGNOSIS — R5383 Other fatigue: Secondary | ICD-10-CM | POA: Diagnosis not present

## 2019-12-06 DIAGNOSIS — R509 Fever, unspecified: Secondary | ICD-10-CM | POA: Diagnosis not present

## 2019-12-06 DIAGNOSIS — I10 Essential (primary) hypertension: Secondary | ICD-10-CM | POA: Diagnosis not present

## 2019-12-06 DIAGNOSIS — J441 Chronic obstructive pulmonary disease with (acute) exacerbation: Secondary | ICD-10-CM | POA: Diagnosis not present

## 2019-12-06 DIAGNOSIS — E860 Dehydration: Secondary | ICD-10-CM | POA: Diagnosis not present

## 2019-12-06 DIAGNOSIS — J9811 Atelectasis: Secondary | ICD-10-CM | POA: Diagnosis not present

## 2019-12-06 DIAGNOSIS — I700 Atherosclerosis of aorta: Secondary | ICD-10-CM | POA: Diagnosis not present

## 2019-12-06 DIAGNOSIS — Z299 Encounter for prophylactic measures, unspecified: Secondary | ICD-10-CM | POA: Diagnosis not present

## 2019-12-09 DIAGNOSIS — Z299 Encounter for prophylactic measures, unspecified: Secondary | ICD-10-CM | POA: Diagnosis not present

## 2019-12-09 DIAGNOSIS — N183 Chronic kidney disease, stage 3 unspecified: Secondary | ICD-10-CM | POA: Diagnosis not present

## 2019-12-09 DIAGNOSIS — E1165 Type 2 diabetes mellitus with hyperglycemia: Secondary | ICD-10-CM | POA: Diagnosis not present

## 2019-12-09 DIAGNOSIS — E1122 Type 2 diabetes mellitus with diabetic chronic kidney disease: Secondary | ICD-10-CM | POA: Diagnosis not present

## 2019-12-11 DIAGNOSIS — E1142 Type 2 diabetes mellitus with diabetic polyneuropathy: Secondary | ICD-10-CM | POA: Diagnosis not present

## 2019-12-11 DIAGNOSIS — E1165 Type 2 diabetes mellitus with hyperglycemia: Secondary | ICD-10-CM | POA: Diagnosis not present

## 2019-12-16 ENCOUNTER — Encounter (HOSPITAL_COMMUNITY): Payer: Medicare Other

## 2019-12-16 ENCOUNTER — Encounter (HOSPITAL_COMMUNITY)
Admission: RE | Admit: 2019-12-16 | Discharge: 2019-12-16 | Disposition: A | Discharge status: Home / Self Care | Payer: Medicare Other | Source: Ambulatory Visit | Attending: Nephrology | Admitting: Nephrology

## 2019-12-18 DIAGNOSIS — E049 Nontoxic goiter, unspecified: Secondary | ICD-10-CM | POA: Diagnosis not present

## 2019-12-18 DIAGNOSIS — E1165 Type 2 diabetes mellitus with hyperglycemia: Secondary | ICD-10-CM | POA: Diagnosis not present

## 2019-12-18 DIAGNOSIS — D649 Anemia, unspecified: Secondary | ICD-10-CM | POA: Diagnosis not present

## 2019-12-23 ENCOUNTER — Other Ambulatory Visit (HOSPITAL_COMMUNITY): Payer: Self-pay | Admitting: Endocrinology

## 2019-12-23 ENCOUNTER — Other Ambulatory Visit: Payer: Self-pay | Admitting: Endocrinology

## 2019-12-23 DIAGNOSIS — E049 Nontoxic goiter, unspecified: Secondary | ICD-10-CM

## 2019-12-26 DIAGNOSIS — N189 Chronic kidney disease, unspecified: Secondary | ICD-10-CM | POA: Diagnosis not present

## 2019-12-26 DIAGNOSIS — E211 Secondary hyperparathyroidism, not elsewhere classified: Secondary | ICD-10-CM | POA: Diagnosis not present

## 2019-12-26 DIAGNOSIS — E559 Vitamin D deficiency, unspecified: Secondary | ICD-10-CM | POA: Diagnosis not present

## 2019-12-26 DIAGNOSIS — E1122 Type 2 diabetes mellitus with diabetic chronic kidney disease: Secondary | ICD-10-CM | POA: Diagnosis not present

## 2019-12-26 DIAGNOSIS — R809 Proteinuria, unspecified: Secondary | ICD-10-CM | POA: Diagnosis not present

## 2019-12-27 ENCOUNTER — Other Ambulatory Visit: Payer: Self-pay

## 2019-12-27 ENCOUNTER — Ambulatory Visit (HOSPITAL_COMMUNITY)
Admission: RE | Admit: 2019-12-27 | Discharge: 2019-12-27 | Disposition: A | Discharge status: Home / Self Care | Payer: Medicare Other | Source: Ambulatory Visit | Attending: Endocrinology | Admitting: Endocrinology

## 2019-12-27 DIAGNOSIS — E872 Acidosis: Secondary | ICD-10-CM | POA: Diagnosis not present

## 2019-12-27 DIAGNOSIS — R809 Proteinuria, unspecified: Secondary | ICD-10-CM | POA: Diagnosis not present

## 2019-12-27 DIAGNOSIS — E049 Nontoxic goiter, unspecified: Secondary | ICD-10-CM | POA: Diagnosis not present

## 2019-12-27 DIAGNOSIS — E041 Nontoxic single thyroid nodule: Secondary | ICD-10-CM | POA: Diagnosis not present

## 2019-12-27 DIAGNOSIS — N189 Chronic kidney disease, unspecified: Secondary | ICD-10-CM | POA: Diagnosis not present

## 2019-12-27 DIAGNOSIS — E211 Secondary hyperparathyroidism, not elsewhere classified: Secondary | ICD-10-CM | POA: Diagnosis not present

## 2019-12-27 DIAGNOSIS — E559 Vitamin D deficiency, unspecified: Secondary | ICD-10-CM | POA: Diagnosis not present

## 2019-12-30 ENCOUNTER — Other Ambulatory Visit: Payer: Self-pay

## 2019-12-30 ENCOUNTER — Encounter (HOSPITAL_COMMUNITY)
Admission: RE | Admit: 2019-12-30 | Discharge: 2019-12-30 | Disposition: A | Discharge status: Home / Self Care | Payer: Medicare Other | Source: Ambulatory Visit | Attending: Nephrology | Admitting: Nephrology

## 2019-12-30 ENCOUNTER — Encounter (HOSPITAL_COMMUNITY): Payer: Self-pay

## 2019-12-30 DIAGNOSIS — D631 Anemia in chronic kidney disease: Secondary | ICD-10-CM | POA: Insufficient documentation

## 2019-12-30 DIAGNOSIS — N184 Chronic kidney disease, stage 4 (severe): Secondary | ICD-10-CM | POA: Diagnosis not present

## 2019-12-30 LAB — POCT HEMOGLOBIN-HEMACUE: Hemoglobin: 7.9 g/dL — ABNORMAL LOW (ref 12.0–15.0)

## 2019-12-30 MED ORDER — EPOETIN ALFA 3000 UNIT/ML IJ SOLN
3000.0000 [IU] | Freq: Once | INTRAMUSCULAR | Status: AC
  Administered 2019-12-30: 14:00:00 3000 [IU] via SUBCUTANEOUS

## 2019-12-30 MED ORDER — EPOETIN ALFA 3000 UNIT/ML IJ SOLN
INTRAMUSCULAR | Status: AC
  Filled 2019-12-30: qty 1

## 2020-01-01 DIAGNOSIS — I10 Essential (primary) hypertension: Secondary | ICD-10-CM | POA: Diagnosis not present

## 2020-01-01 DIAGNOSIS — Z299 Encounter for prophylactic measures, unspecified: Secondary | ICD-10-CM | POA: Diagnosis not present

## 2020-01-01 DIAGNOSIS — Z87891 Personal history of nicotine dependence: Secondary | ICD-10-CM | POA: Diagnosis not present

## 2020-01-01 DIAGNOSIS — J441 Chronic obstructive pulmonary disease with (acute) exacerbation: Secondary | ICD-10-CM | POA: Diagnosis not present

## 2020-01-01 DIAGNOSIS — R0602 Shortness of breath: Secondary | ICD-10-CM | POA: Diagnosis not present

## 2020-01-01 DIAGNOSIS — E119 Type 2 diabetes mellitus without complications: Secondary | ICD-10-CM | POA: Diagnosis not present

## 2020-01-03 ENCOUNTER — Encounter: Payer: Self-pay | Admitting: Gastroenterology

## 2020-01-03 ENCOUNTER — Encounter (HOSPITAL_COMMUNITY)
Admission: RE | Admit: 2020-01-03 | Discharge: 2020-01-03 | Disposition: A | Discharge status: Home / Self Care | Payer: Medicare Other | Source: Ambulatory Visit | Attending: Nephrology | Admitting: Nephrology

## 2020-01-03 ENCOUNTER — Other Ambulatory Visit: Payer: Self-pay

## 2020-01-03 DIAGNOSIS — N184 Chronic kidney disease, stage 4 (severe): Secondary | ICD-10-CM | POA: Diagnosis not present

## 2020-01-03 DIAGNOSIS — D631 Anemia in chronic kidney disease: Secondary | ICD-10-CM | POA: Diagnosis not present

## 2020-01-03 MED ORDER — SODIUM CHLORIDE 0.9 % IV SOLN
510.0000 mg | Freq: Once | INTRAVENOUS | Status: DC
  Filled 2020-01-03: qty 17

## 2020-01-03 MED ORDER — SODIUM CHLORIDE 0.9 % IV SOLN: Freq: Once | INTRAVENOUS | Status: AC

## 2020-01-03 MED ORDER — SODIUM CHLORIDE 0.9 % IV SOLN
510.0000 mg | Freq: Once | INTRAVENOUS | Status: AC
  Administered 2020-01-03: 510 mg via INTRAVENOUS
  Filled 2020-01-03: qty 17

## 2020-01-06 DIAGNOSIS — E049 Nontoxic goiter, unspecified: Secondary | ICD-10-CM | POA: Diagnosis not present

## 2020-01-06 DIAGNOSIS — I10 Essential (primary) hypertension: Secondary | ICD-10-CM | POA: Diagnosis not present

## 2020-01-06 DIAGNOSIS — E1165 Type 2 diabetes mellitus with hyperglycemia: Secondary | ICD-10-CM | POA: Diagnosis not present

## 2020-01-06 DIAGNOSIS — N189 Chronic kidney disease, unspecified: Secondary | ICD-10-CM | POA: Diagnosis not present

## 2020-01-07 ENCOUNTER — Other Ambulatory Visit: Payer: Self-pay | Admitting: Endocrinology

## 2020-01-07 DIAGNOSIS — E049 Nontoxic goiter, unspecified: Secondary | ICD-10-CM

## 2020-01-08 ENCOUNTER — Other Ambulatory Visit (HOSPITAL_COMMUNITY): Payer: Self-pay | Admitting: Endocrinology

## 2020-01-08 DIAGNOSIS — E1122 Type 2 diabetes mellitus with diabetic chronic kidney disease: Secondary | ICD-10-CM | POA: Diagnosis not present

## 2020-01-08 DIAGNOSIS — Z299 Encounter for prophylactic measures, unspecified: Secondary | ICD-10-CM | POA: Diagnosis not present

## 2020-01-08 DIAGNOSIS — E039 Hypothyroidism, unspecified: Secondary | ICD-10-CM | POA: Diagnosis not present

## 2020-01-08 DIAGNOSIS — D649 Anemia, unspecified: Secondary | ICD-10-CM | POA: Diagnosis not present

## 2020-01-08 DIAGNOSIS — E049 Nontoxic goiter, unspecified: Secondary | ICD-10-CM

## 2020-01-08 DIAGNOSIS — E1165 Type 2 diabetes mellitus with hyperglycemia: Secondary | ICD-10-CM | POA: Diagnosis not present

## 2020-01-10 ENCOUNTER — Other Ambulatory Visit: Payer: Self-pay

## 2020-01-10 ENCOUNTER — Encounter (HOSPITAL_COMMUNITY): Payer: Self-pay

## 2020-01-10 ENCOUNTER — Encounter (HOSPITAL_COMMUNITY)
Admission: RE | Admit: 2020-01-10 | Discharge: 2020-01-10 | Disposition: A | Discharge status: Home / Self Care | Payer: Medicare Other | Source: Ambulatory Visit | Attending: Nephrology | Admitting: Nephrology

## 2020-01-10 DIAGNOSIS — N184 Chronic kidney disease, stage 4 (severe): Secondary | ICD-10-CM | POA: Diagnosis not present

## 2020-01-10 DIAGNOSIS — D631 Anemia in chronic kidney disease: Secondary | ICD-10-CM | POA: Diagnosis not present

## 2020-01-10 MED ORDER — SODIUM CHLORIDE 0.9 % IV SOLN: Freq: Once | INTRAVENOUS | Status: AC

## 2020-01-10 MED ORDER — EPOETIN ALFA 3000 UNIT/ML IJ SOLN: 3000.0000 [IU] | Freq: Once | INTRAMUSCULAR | Status: DC

## 2020-01-10 MED ORDER — SODIUM CHLORIDE 0.9 % IV SOLN
510.0000 mg | Freq: Once | INTRAVENOUS | Status: AC
  Administered 2020-01-10: 510 mg via INTRAVENOUS
  Filled 2020-01-10: qty 510

## 2020-01-13 ENCOUNTER — Other Ambulatory Visit: Payer: Self-pay

## 2020-01-13 ENCOUNTER — Encounter (HOSPITAL_COMMUNITY): Payer: Self-pay

## 2020-01-13 ENCOUNTER — Encounter (HOSPITAL_COMMUNITY)
Admission: RE | Admit: 2020-01-13 | Discharge: 2020-01-13 | Disposition: A | Discharge status: Home / Self Care | Payer: Medicare Other | Source: Ambulatory Visit | Attending: Nephrology | Admitting: Nephrology

## 2020-01-13 DIAGNOSIS — N184 Chronic kidney disease, stage 4 (severe): Secondary | ICD-10-CM | POA: Diagnosis not present

## 2020-01-13 DIAGNOSIS — D631 Anemia in chronic kidney disease: Secondary | ICD-10-CM | POA: Diagnosis not present

## 2020-01-13 LAB — POCT HEMOGLOBIN-HEMACUE
Hemoglobin: 8.7 g/dL — ABNORMAL LOW (ref 12.0–15.0)
Hemoglobin: 8.7 g/dL — ABNORMAL LOW (ref 12.0–15.0)

## 2020-01-13 MED ORDER — EPOETIN ALFA 3000 UNIT/ML IJ SOLN
3000.0000 [IU] | Freq: Once | INTRAMUSCULAR | Status: AC
  Administered 2020-01-13: 3000 [IU] via SUBCUTANEOUS
  Filled 2020-01-13: qty 1

## 2020-01-16 ENCOUNTER — Other Ambulatory Visit: Payer: Medicare Other

## 2020-01-19 DIAGNOSIS — J449 Chronic obstructive pulmonary disease, unspecified: Secondary | ICD-10-CM | POA: Diagnosis not present

## 2020-01-19 DIAGNOSIS — E119 Type 2 diabetes mellitus without complications: Secondary | ICD-10-CM | POA: Diagnosis not present

## 2020-01-19 DIAGNOSIS — I10 Essential (primary) hypertension: Secondary | ICD-10-CM | POA: Diagnosis not present

## 2020-01-24 ENCOUNTER — Ambulatory Visit: Payer: Medicare Other | Admitting: Gastroenterology

## 2020-01-24 ENCOUNTER — Other Ambulatory Visit: Payer: Self-pay

## 2020-01-24 ENCOUNTER — Encounter: Payer: Self-pay | Admitting: Gastroenterology

## 2020-01-24 DIAGNOSIS — K5900 Constipation, unspecified: Secondary | ICD-10-CM | POA: Insufficient documentation

## 2020-01-24 DIAGNOSIS — K219 Gastro-esophageal reflux disease without esophagitis: Secondary | ICD-10-CM

## 2020-01-24 DIAGNOSIS — D509 Iron deficiency anemia, unspecified: Secondary | ICD-10-CM | POA: Diagnosis not present

## 2020-01-24 NOTE — Progress Notes (Signed)
Primary Care Physician:  Monico Blitz, MD Referring provider: Dr. Theador Hawthorne Primary Gastroenterologist:  Garfield Cornea, MD   Chief Complaint  Patient presents with  . Anemia    abd pain, takes dicyclomine to help, constipation    HPI:  Cindy Park is a 64 y.o. female here at the request of Dr. Theador Hawthorne for IDA.   Dec 26, 2019: Hemoglobin 8.6 down from 9.7 back in March.  MCV 82.2, platelets 217,000, white blood cell count 9200, creatinine 2.91, albumin 3.1, BUN 40, iron 36 down from 67 in March, TIBC 248, iron saturation 15%, ferritin of 1524 in December.  Patient has been receiving iron infusions, last one May 21. Has received two infusions. Also receives weekly EPO shots.  Last hemoglobin 8.7 on May 24.   Omeprazole controls reflux. Has been on it for more than five years. No vomiting. No dysphagia. Takes bentyl for abdominal cramping, on it for several months. Takes twice per day. No diarrhea. Stays on colace to keep bowels regular. No melena, brbpr.   H/o colonoscopy "years ago" in [place], more than 10 years ago. No polyps.   BP has been running high lately. Last night BP was lower and felt weak 90/60.  No fever. Improved this morning. Denies any recent medication adjustments except for her lasix.    Current Outpatient Medications  Medication Sig Dispense Refill  . albuterol (PROVENTIL) (2.5 MG/3ML) 0.083% nebulizer solution Take 2.5 mg by nebulization 4 (four) times daily.    Marland Kitchen amLODipine (NORVASC) 5 MG tablet Take 5 mg by mouth 2 (two) times daily.    Marland Kitchen aspirin EC 81 MG tablet Take 81 mg by mouth daily.    Marland Kitchen atorvastatin (LIPITOR) 40 MG tablet Take 40 mg by mouth at bedtime.    . calcitRIOL (ROCALTROL) 0.25 MCG capsule Take 0.25 mcg by mouth daily.    Marland Kitchen dicyclomine (BENTYL) 10 MG capsule Take 10 mg by mouth 2 (two) times daily.     Marland Kitchen docusate sodium (COLACE) 100 MG capsule Take 200 mg by mouth daily.    Marland Kitchen epoetin alfa (EPOGEN) 3000 UNIT/ML injection Inject 3,000 Units  into the vein every 14 (fourteen) days.    . fenofibrate (TRICOR) 145 MG tablet Take 145 mg by mouth daily.    . ferrous sulfate 325 (65 FE) MG tablet Take 325 mg by mouth 2 (two) times daily with a meal.    . Fluticasone-Umeclidin-Vilant (TRELEGY ELLIPTA) 100-62.5-25 MCG/INH AEPB Inhale 1 puff into the lungs daily.    . furosemide (LASIX) 40 MG tablet Take 20 mg by mouth 2 (two) times daily.     Marland Kitchen gabapentin (NEURONTIN) 300 MG capsule Take 300 mg by mouth 2 (two) times daily.     Marland Kitchen gemfibrozil (LOPID) 600 MG tablet Take 600 mg by mouth 2 (two) times daily before a meal.    . glipiZIDE (GLUCOTROL) 10 MG tablet Take 10 mg by mouth 2 (two) times daily.    . Insulin Lispro Prot & Lispro (HUMALOG MIX 75/25 [place]) Inject into the skin 3 (three) times daily. 60 unit am 15 units lunch 60 units supper    . losartan (COZAAR) 100 MG tablet Take 50 mg by mouth daily.     . Magnesium Hydroxide (MAGNESIA PO) Take 1 tablet by mouth daily.    . metoprolol succinate (TOPROL-XL) 25 MG 24 hr tablet Take 25 mg by mouth daily.    . montelukast (SINGULAIR) 10 MG tablet Take 10 mg by mouth at bedtime.    Marland Kitchen  Omeprazole 20 MG TBEC Take 20 mg by mouth daily.     . sitaGLIPtin (JANUVIA) 25 MG tablet Take 25 mg by mouth daily.    . vitamin B-12 (CYANOCOBALAMIN) 1000 MCG tablet Take 1,000 mcg by mouth daily.     . Vitamin D, Ergocalciferol, 50 MCG (2000 UT) CAPS Take 2,000 Units by mouth 2 (two) times daily. Takes on Sunday      No current facility-administered medications for this visit.    Allergies as of 01/24/2020 - Review Complete 01/24/2020  Allergen Reaction Noted  . Niacin Other (See Comments)   . Bactrim [sulfamethoxazole-trimethoprim] Swelling and Rash 01/24/2020    Past Medical History:  Diagnosis Date  . Anemia   . Arthritis   . Asthma   . COPD (chronic obstructive pulmonary disease) ([place])   . Diabetes mellitus without complication (Home)   . Fluid excess   . GERD (gastroesophageal reflux disease)    . Goiter   . Hypertension   . Renal failure     Past Surgical History:  Procedure Laterality Date  . ABDOMINAL HYSTERECTOMY    . CHOLECYSTECTOMY      Family History  Problem Relation Age of Onset  . Kidney disease Mother        ESRD  . Kidney disease Sister        ESRD  . Colon polyps Brother 41  . Kidney disease Maternal Grandmother        ESRD  . Colon cancer Neg Hx     Social History   Socioeconomic History  . Marital status: Married    Spouse name: Not on file  . Number of children: Not on file  . Years of education: Not on file  . Highest education level: Not on file  Occupational History  . Not on file  Tobacco Use  . Smoking status: Never Smoker  . Smokeless tobacco: Never Used  Substance and Sexual Activity  . Alcohol use: No  . Drug use: No  . Sexual activity: Not on file  Other Topics Concern  . Not on file  Social History Narrative  . Not on file   Social Determinants of Health   Financial Resource Strain:   . Difficulty of Paying Living Expenses:   Food Insecurity:   . Worried About Charity fundraiser in the Last Year:   . Arboriculturist in the Last Year:   Transportation Needs:   . Film/video editor (Medical):   Marland Kitchen Lack of Transportation (Non-Medical):   Physical Activity:   . Days of Exercise per Week:   . Minutes of Exercise per Session:   Stress:   . Feeling of Stress :   Social Connections:   . Frequency of Communication with Friends and Family:   . Frequency of Social Gatherings with Friends and Family:   . Attends Religious Services:   . Active Member of Clubs or Organizations:   . Attends Archivist Meetings:   Marland Kitchen Marital Status:   Intimate Partner Violence:   . Fear of Current or Ex-Partner:   . Emotionally Abused:   Marland Kitchen Physically Abused:   . Sexually Abused:       ROS:  General: Negative for anorexia, weight loss, fever, chills, fatigue, weakness. Eyes: Negative for vision changes.  ENT: Negative for  hoarseness, difficulty swallowing , nasal congestion. CV: Negative for chest pain, angina, palpitations, dyspnea on exertion, peripheral edema.  Respiratory: Negative for dyspnea at rest, dyspnea on exertion, cough,  sputum, wheezing.  GI: See history of present illness. GU:  Negative for dysuria, hematuria, urinary incontinence, urinary frequency, nocturnal urination.  MS: Negative for joint pain, low back pain.  Derm: Negative for rash or itching.  Neuro: Negative for weakness, abnormal sensation, seizure, frequent headaches, memory loss, confusion.  Psych: Negative for anxiety, depression, suicidal ideation, hallucinations.  Endo: Negative for unusual weight change.  Heme: Negative for bruising or bleeding. Allergy: Negative for rash or hives.    Physical Examination:  BP (!) 173/99   Pulse 92   Temp (!) 97.1 F (36.2 C) (Temporal)   Ht 5\' 7"  (1.702 m)   Wt 223 lb 6.4 oz (101.3 kg)   BMI 34.99 kg/m    General: Well-nourished, well-developed in no acute distress.  Head: Normocephalic, atraumatic.   Eyes: Conjunctiva pink, no icterus. Mouth: masked. Neck: Supple without thyromegaly, masses, or lymphadenopathy.  Lungs: Clear to auscultation bilaterally.  Heart: Regular rate and rhythm, no murmurs rubs or gallops.  Abdomen: Bowel sounds are normal, nontender, nondistended, no hepatosplenomegaly or masses, no abdominal bruits or    hernia , no rebound or guarding.   Rectal: not performed Extremities: Trace bilateral lower extremity edema. No clubbing or deformities.  Neuro: Alert and oriented x 4 , grossly normal neurologically.  Skin: Warm and dry, no rash or jaundice.   Psych: Alert and cooperative, normal mood and affect.  Labs: See hpi  Imaging Studies: US THYROID  Result Date: 12/27/2019 CLINICAL DATA:  Nontoxic goiter EXAM: THYROID ULTRASOUND TECHNIQUE: Ultrasound examination of the thyroid gland and adjacent soft tissues was performed. COMPARISON:  None available  FINDINGS: Parenchymal Echotexture: Mildly heterogenous Isthmus: 6 mm Right lobe: 4.5 x 2.0 x 1.5 cm Left lobe: 5.0 x 2.3 x 1.8 cm _________________________________________________________ Estimated total number of nodules >/= 1 cm: 1 Number of spongiform nodules >/=  2 cm not described below (TR1): 0 Number of mixed cystic and solid nodules >/= 1.5 cm not described below (TR2): 0 _________________________________________________________ Nodule # 1: Location: Left; Inferior Maximum size: 1.6 cm; Other 2 dimensions: 1.4 x 1.0 cm Composition: solid/almost completely solid (2) Echogenicity: isoechoic (1) Shape: not taller-than-wide (0) Margins: ill-defined (0) Echogenic foci: none (0) ACR TI-RADS total points: 3. ACR TI-RADS risk category: TR3 (3 points). ACR TI-RADS recommendations: *Given size (>/= 1.5 - 2.4 cm) and appearance, a follow-up ultrasound in 1 year should be considered based on TI-RADS criteria. _________________________________________________________ Mild thyroid heterogeneity and enlargement. No hypervascularity or regional adenopathy. IMPRESSION: 1.6 cm left inferior thyroid TR 3 nodule meets criteria for follow-up in 1 year. The above is in keeping with the ACR TI-RADS recommendations - J Am Coll Radiol 2017;14:587-595. Electronically Signed   By: Jerilynn Mages.  Shick M.D.   On: 12/27/2019 15:04   Impression/Plan:  64 y/o female with stage IV chronic kidney disease, chronic anemia, DM, COPD, HTN presenting for further evaluation of IDA. She has been receiving iron infusions and EPO injections under direction of Dr. Theador Hawthorne. Her last colonoscopy was over 10 years ago. No history of polyps. Family history of polyps in a brother. She has chronic GERD on chronic PPI for greater than 5 years. Currently with vague abdominal cramping that she takes Bentyl with good relief. Controls constipation with daily stool softener.   For IDA, would advise colonoscopy in the near future.  I have discussed the risks,  alternatives, benefits with regards to but not limited to the risk of reaction to medication, bleeding, infection, perforation and the patient is agreeable to  proceed. Written consent to be obtained.  Consider EGD later this year for Barrett's screening. Currently no alarm symptoms. Continue PPI daily.  Patient reports significant swings in blood pressure. She will recheck her pressure at home today with her cuff to compare with ours. If similar readings, she will continue to monitor her pressures at home and notify PCP or Dr. Theador Hawthorne if her pressures are running low.

## 2020-01-24 NOTE — Patient Instructions (Signed)
1. Colace as before for constipation.  If your constipation is not adequately controlled, you can try MiraLAX 1 capful daily. 2. Recheck your blood pressure when you get home today to compare your cuff with the results that we have today.  If you notice that blood pressures are running low, please notify your PCP or Dr. Theador Hawthorne, if needed go to the emergency department. 3. Colonoscopy as scheduled.  Please see separate instructions.

## 2020-01-27 ENCOUNTER — Other Ambulatory Visit: Payer: Self-pay

## 2020-01-27 ENCOUNTER — Encounter (HOSPITAL_COMMUNITY)
Admission: RE | Admit: 2020-01-27 | Discharge: 2020-01-27 | Disposition: A | Discharge status: Home / Self Care | Payer: Medicare Other | Source: Ambulatory Visit | Attending: Nephrology | Admitting: Nephrology

## 2020-01-27 ENCOUNTER — Encounter (HOSPITAL_COMMUNITY): Payer: Self-pay

## 2020-01-27 DIAGNOSIS — R0602 Shortness of breath: Secondary | ICD-10-CM | POA: Diagnosis not present

## 2020-01-27 DIAGNOSIS — D631 Anemia in chronic kidney disease: Secondary | ICD-10-CM | POA: Diagnosis not present

## 2020-01-27 DIAGNOSIS — N184 Chronic kidney disease, stage 4 (severe): Secondary | ICD-10-CM | POA: Insufficient documentation

## 2020-01-27 DIAGNOSIS — E1165 Type 2 diabetes mellitus with hyperglycemia: Secondary | ICD-10-CM | POA: Diagnosis not present

## 2020-01-27 DIAGNOSIS — I10 Essential (primary) hypertension: Secondary | ICD-10-CM | POA: Diagnosis not present

## 2020-01-27 DIAGNOSIS — H1132 Conjunctival hemorrhage, left eye: Secondary | ICD-10-CM | POA: Diagnosis not present

## 2020-01-27 DIAGNOSIS — E039 Hypothyroidism, unspecified: Secondary | ICD-10-CM | POA: Diagnosis not present

## 2020-01-27 DIAGNOSIS — Z299 Encounter for prophylactic measures, unspecified: Secondary | ICD-10-CM | POA: Diagnosis not present

## 2020-01-27 LAB — POCT HEMOGLOBIN-HEMACUE: Hemoglobin: 9.4 g/dL — ABNORMAL LOW (ref 12.0–15.0)

## 2020-01-27 MED ORDER — EPOETIN ALFA 3000 UNIT/ML IJ SOLN
3000.0000 [IU] | Freq: Once | INTRAMUSCULAR | Status: AC
  Administered 2020-01-27: 3000 [IU] via SUBCUTANEOUS
  Filled 2020-01-27: qty 1

## 2020-01-29 NOTE — Progress Notes (Signed)
CC'ED TO PCP 

## 2020-02-10 ENCOUNTER — Encounter (HOSPITAL_COMMUNITY)
Admission: RE | Admit: 2020-02-10 | Discharge: 2020-02-10 | Disposition: A | Discharge status: Home / Self Care | Payer: Medicare Other | Source: Ambulatory Visit | Attending: Nephrology | Admitting: Nephrology

## 2020-02-10 ENCOUNTER — Other Ambulatory Visit: Payer: Self-pay

## 2020-02-10 DIAGNOSIS — Z299 Encounter for prophylactic measures, unspecified: Secondary | ICD-10-CM | POA: Diagnosis not present

## 2020-02-10 DIAGNOSIS — D631 Anemia in chronic kidney disease: Secondary | ICD-10-CM | POA: Diagnosis not present

## 2020-02-10 DIAGNOSIS — N184 Chronic kidney disease, stage 4 (severe): Secondary | ICD-10-CM | POA: Diagnosis not present

## 2020-02-10 DIAGNOSIS — R0602 Shortness of breath: Secondary | ICD-10-CM | POA: Diagnosis not present

## 2020-02-10 DIAGNOSIS — I10 Essential (primary) hypertension: Secondary | ICD-10-CM | POA: Diagnosis not present

## 2020-02-10 DIAGNOSIS — E1165 Type 2 diabetes mellitus with hyperglycemia: Secondary | ICD-10-CM | POA: Diagnosis not present

## 2020-02-10 LAB — POCT HEMOGLOBIN-HEMACUE: Hemoglobin: 8.6 g/dL — ABNORMAL LOW (ref 12.0–15.0)

## 2020-02-10 MED ORDER — EPOETIN ALFA 3000 UNIT/ML IJ SOLN
3000.0000 [IU] | Freq: Once | INTRAMUSCULAR | Status: AC
  Administered 2020-02-10: 3000 [IU] via SUBCUTANEOUS

## 2020-02-10 MED ORDER — EPOETIN ALFA 3000 UNIT/ML IJ SOLN
INTRAMUSCULAR | Status: AC
  Filled 2020-02-10: qty 1

## 2020-02-12 DIAGNOSIS — E119 Type 2 diabetes mellitus without complications: Secondary | ICD-10-CM | POA: Diagnosis not present

## 2020-02-13 DIAGNOSIS — J449 Chronic obstructive pulmonary disease, unspecified: Secondary | ICD-10-CM | POA: Diagnosis not present

## 2020-02-13 DIAGNOSIS — I313 Pericardial effusion (noninflammatory): Secondary | ICD-10-CM | POA: Diagnosis not present

## 2020-02-13 DIAGNOSIS — Z299 Encounter for prophylactic measures, unspecified: Secondary | ICD-10-CM | POA: Diagnosis not present

## 2020-02-13 DIAGNOSIS — I10 Essential (primary) hypertension: Secondary | ICD-10-CM | POA: Diagnosis not present

## 2020-02-19 DIAGNOSIS — J449 Chronic obstructive pulmonary disease, unspecified: Secondary | ICD-10-CM | POA: Diagnosis not present

## 2020-02-19 DIAGNOSIS — I10 Essential (primary) hypertension: Secondary | ICD-10-CM | POA: Diagnosis not present

## 2020-02-19 DIAGNOSIS — E119 Type 2 diabetes mellitus without complications: Secondary | ICD-10-CM | POA: Diagnosis not present

## 2020-02-20 DIAGNOSIS — I5032 Chronic diastolic (congestive) heart failure: Secondary | ICD-10-CM | POA: Diagnosis not present

## 2020-02-20 DIAGNOSIS — I10 Essential (primary) hypertension: Secondary | ICD-10-CM | POA: Diagnosis not present

## 2020-02-20 DIAGNOSIS — I313 Pericardial effusion (noninflammatory): Secondary | ICD-10-CM | POA: Diagnosis not present

## 2020-02-20 DIAGNOSIS — R0602 Shortness of breath: Secondary | ICD-10-CM | POA: Diagnosis not present

## 2020-02-20 DIAGNOSIS — E1169 Type 2 diabetes mellitus with other specified complication: Secondary | ICD-10-CM | POA: Diagnosis not present

## 2020-02-21 ENCOUNTER — Telehealth: Payer: Self-pay | Admitting: *Deleted

## 2020-02-21 NOTE — Telephone Encounter (Signed)
Schedule TCS with RMR. Needs trilyte prep, hold iron 7 days prior, see encounter for diabetic med adjustments   Called pt, LMOVM

## 2020-02-25 ENCOUNTER — Encounter (HOSPITAL_COMMUNITY)
Admission: RE | Admit: 2020-02-25 | Discharge: 2020-02-25 | Disposition: A | Discharge status: Home / Self Care | Payer: Medicare Other | Source: Ambulatory Visit | Attending: Nephrology | Admitting: Nephrology

## 2020-02-25 ENCOUNTER — Encounter (HOSPITAL_COMMUNITY): Payer: Self-pay

## 2020-02-25 ENCOUNTER — Other Ambulatory Visit: Payer: Self-pay

## 2020-02-25 DIAGNOSIS — D631 Anemia in chronic kidney disease: Secondary | ICD-10-CM | POA: Insufficient documentation

## 2020-02-25 DIAGNOSIS — N184 Chronic kidney disease, stage 4 (severe): Secondary | ICD-10-CM | POA: Insufficient documentation

## 2020-02-25 LAB — POCT HEMOGLOBIN-HEMACUE: Hemoglobin: 9.4 g/dL — ABNORMAL LOW (ref 12.0–15.0)

## 2020-02-25 MED ORDER — EPOETIN ALFA 3000 UNIT/ML IJ SOLN
INTRAMUSCULAR | Status: AC
  Filled 2020-02-25: qty 1

## 2020-02-25 MED ORDER — EPOETIN ALFA 3000 UNIT/ML IJ SOLN
3000.0000 [IU] | Freq: Once | INTRAMUSCULAR | Status: AC
  Administered 2020-02-25: 3000 [IU] via SUBCUTANEOUS

## 2020-02-26 ENCOUNTER — Telehealth: Payer: Self-pay | Admitting: *Deleted

## 2020-02-26 MED ORDER — PEG 3350-KCL-NA BICARB-NACL 420 G PO SOLR: ORAL | 0 refills | Status: DC

## 2020-02-26 NOTE — Telephone Encounter (Signed)
Called pt. She has been scheduled for TCS with RMR 8/4 at 10:15am, arrival 9:15am, covid test scheduled for 8/2 at 12:30pm. Confirmed mailing address. Per encounter form patient needs trilyte prep. Rx sent in. Confirmed pharmacy.

## 2020-03-04 DIAGNOSIS — N189 Chronic kidney disease, unspecified: Secondary | ICD-10-CM | POA: Diagnosis not present

## 2020-03-04 DIAGNOSIS — E211 Secondary hyperparathyroidism, not elsewhere classified: Secondary | ICD-10-CM | POA: Diagnosis not present

## 2020-03-04 DIAGNOSIS — R809 Proteinuria, unspecified: Secondary | ICD-10-CM | POA: Diagnosis not present

## 2020-03-04 DIAGNOSIS — E049 Nontoxic goiter, unspecified: Secondary | ICD-10-CM | POA: Diagnosis not present

## 2020-03-04 DIAGNOSIS — E1122 Type 2 diabetes mellitus with diabetic chronic kidney disease: Secondary | ICD-10-CM | POA: Diagnosis not present

## 2020-03-04 DIAGNOSIS — E559 Vitamin D deficiency, unspecified: Secondary | ICD-10-CM | POA: Diagnosis not present

## 2020-03-06 DIAGNOSIS — N170 Acute kidney failure with tubular necrosis: Secondary | ICD-10-CM | POA: Diagnosis not present

## 2020-03-06 DIAGNOSIS — N189 Chronic kidney disease, unspecified: Secondary | ICD-10-CM | POA: Diagnosis not present

## 2020-03-06 DIAGNOSIS — E211 Secondary hyperparathyroidism, not elsewhere classified: Secondary | ICD-10-CM | POA: Diagnosis not present

## 2020-03-06 DIAGNOSIS — R809 Proteinuria, unspecified: Secondary | ICD-10-CM | POA: Diagnosis not present

## 2020-03-06 DIAGNOSIS — D631 Anemia in chronic kidney disease: Secondary | ICD-10-CM | POA: Diagnosis not present

## 2020-03-09 ENCOUNTER — Other Ambulatory Visit: Payer: Self-pay

## 2020-03-09 DIAGNOSIS — R600 Localized edema: Secondary | ICD-10-CM

## 2020-03-09 NOTE — Progress Notes (Signed)
Echo scheduled 03/10/20 at 3 pm by N.Lars Pinks, DOD, order placed

## 2020-03-10 ENCOUNTER — Encounter (HOSPITAL_COMMUNITY): Payer: Self-pay

## 2020-03-10 ENCOUNTER — Encounter (HOSPITAL_COMMUNITY)
Admission: RE | Admit: 2020-03-10 | Discharge: 2020-03-10 | Disposition: A | Discharge status: Home / Self Care | Payer: Medicare Other | Source: Ambulatory Visit | Attending: Nephrology | Admitting: Nephrology

## 2020-03-10 ENCOUNTER — Other Ambulatory Visit: Payer: Self-pay

## 2020-03-10 ENCOUNTER — Ambulatory Visit (HOSPITAL_COMMUNITY)
Admission: RE | Admit: 2020-03-10 | Discharge: 2020-03-10 | Disposition: A | Discharge status: Home / Self Care | Payer: Medicare Other | Source: Ambulatory Visit | Attending: Internal Medicine | Admitting: Internal Medicine

## 2020-03-10 DIAGNOSIS — R600 Localized edema: Secondary | ICD-10-CM | POA: Insufficient documentation

## 2020-03-10 DIAGNOSIS — D631 Anemia in chronic kidney disease: Secondary | ICD-10-CM | POA: Diagnosis not present

## 2020-03-10 DIAGNOSIS — N184 Chronic kidney disease, stage 4 (severe): Secondary | ICD-10-CM | POA: Diagnosis not present

## 2020-03-10 LAB — RENAL FUNCTION PANEL
Albumin: 3.2 g/dL — ABNORMAL LOW (ref 3.5–5.0)
Anion gap: 13 (ref 5–15)
BUN: 49 mg/dL — ABNORMAL HIGH (ref 8–23)
CO2: 21 mmol/L — ABNORMAL LOW (ref 22–32)
Calcium: 8.8 mg/dL — ABNORMAL LOW (ref 8.9–10.3)
Chloride: 102 mmol/L (ref 98–111)
Creatinine, Ser: 3.74 mg/dL — ABNORMAL HIGH (ref 0.44–1.00)
GFR calc Af Amer: 14 mL/min — ABNORMAL LOW (ref >60)
GFR calc non Af Amer: 12 mL/min — ABNORMAL LOW (ref >60)
Glucose, Bld: 228 mg/dL — ABNORMAL HIGH (ref 70–99)
Phosphorus: 4.3 mg/dL (ref 2.5–4.6)
Potassium: 4.2 mmol/L (ref 3.5–5.1)
Sodium: 136 mmol/L (ref 135–145)

## 2020-03-10 LAB — ECHOCARDIOGRAM COMPLETE
Area-P 1/2: 3.81 cm2
S' Lateral: 2.14 cm

## 2020-03-10 MED ORDER — EPOETIN ALFA 3000 UNIT/ML IJ SOLN
3000.0000 [IU] | Freq: Once | INTRAMUSCULAR | Status: AC
  Administered 2020-03-10: 3000 [IU] via SUBCUTANEOUS
  Filled 2020-03-10: qty 1

## 2020-03-10 NOTE — Progress Notes (Signed)
*  PRELIMINARY RESULTS* Echocardiogram 2D Echocardiogram has been performed.  Cindy Park 03/10/2020, 3:51 PM

## 2020-03-11 DIAGNOSIS — E211 Secondary hyperparathyroidism, not elsewhere classified: Secondary | ICD-10-CM | POA: Diagnosis not present

## 2020-03-11 DIAGNOSIS — D631 Anemia in chronic kidney disease: Secondary | ICD-10-CM | POA: Diagnosis not present

## 2020-03-11 DIAGNOSIS — R809 Proteinuria, unspecified: Secondary | ICD-10-CM | POA: Diagnosis not present

## 2020-03-11 DIAGNOSIS — N189 Chronic kidney disease, unspecified: Secondary | ICD-10-CM | POA: Diagnosis not present

## 2020-03-11 DIAGNOSIS — N170 Acute kidney failure with tubular necrosis: Secondary | ICD-10-CM | POA: Diagnosis not present

## 2020-03-11 LAB — POCT HEMOGLOBIN-HEMACUE: Hemoglobin: 9.2 g/dL — ABNORMAL LOW (ref 12.0–15.0)

## 2020-03-18 ENCOUNTER — Telehealth: Payer: Self-pay | Admitting: Internal Medicine

## 2020-03-18 NOTE — Telephone Encounter (Signed)
Spoke with pt. Pt goes to [facility] every 2 weeks per pts kidney doctor to have Epogen procrit shots, if pts iron level isn't where it needs to be. Please advise if it's ok for pt to have iron shot if needed on 03/24/20 prior to pts procedure with RMR on 8/4?

## 2020-03-18 NOTE — Telephone Encounter (Signed)
Pt is scheduled procedure with RMR on 8/4 and she is suppose to get her Epogen shots on 8/3. She wants to know if it's ok to take those or does she need to wait. Please advise.  [phone]

## 2020-03-19 ENCOUNTER — Telehealth: Payer: Self-pay | Admitting: Emergency Medicine

## 2020-03-19 NOTE — Telephone Encounter (Signed)
See phone note by AM from yesterday.

## 2020-03-19 NOTE — Telephone Encounter (Signed)
See previous note am called pt

## 2020-03-19 NOTE — Telephone Encounter (Signed)
Pt is suppose to have a tcs on 8/4 but she is suppose to [place] on 8/3 to get a shot for iron and she wants to know does she need to cancel that appt

## 2020-03-19 NOTE — Telephone Encounter (Signed)
She gets Epogen injection every two weeks if needed. She can get injection on 03/24/20 if Dr. Theador Hawthorne orders this. It will not interfere with her procedure.

## 2020-03-19 NOTE — Telephone Encounter (Signed)
Noted. Spoke with pt. Pt is aware that it's ok to have injection on 8/3 if needed. Pt is aware that it won't affect her pts procedure.

## 2020-03-20 DIAGNOSIS — I10 Essential (primary) hypertension: Secondary | ICD-10-CM | POA: Diagnosis not present

## 2020-03-20 DIAGNOSIS — J449 Chronic obstructive pulmonary disease, unspecified: Secondary | ICD-10-CM | POA: Diagnosis not present

## 2020-03-20 DIAGNOSIS — E119 Type 2 diabetes mellitus without complications: Secondary | ICD-10-CM | POA: Diagnosis not present

## 2020-03-23 ENCOUNTER — Other Ambulatory Visit (HOSPITAL_COMMUNITY)
Admission: RE | Admit: 2020-03-23 | Discharge: 2020-03-23 | Disposition: A | Discharge status: Home / Self Care | Payer: Medicare Other | Source: Ambulatory Visit | Attending: Internal Medicine | Admitting: Internal Medicine

## 2020-03-23 ENCOUNTER — Other Ambulatory Visit: Payer: Self-pay

## 2020-03-23 DIAGNOSIS — Z20822 Contact with and (suspected) exposure to covid-19: Secondary | ICD-10-CM | POA: Diagnosis not present

## 2020-03-23 DIAGNOSIS — Z01812 Encounter for preprocedural laboratory examination: Secondary | ICD-10-CM | POA: Insufficient documentation

## 2020-03-23 LAB — SARS CORONAVIRUS 2 (TAT 6-24 HRS): SARS Coronavirus 2: NEGATIVE

## 2020-03-24 ENCOUNTER — Encounter (HOSPITAL_COMMUNITY)
Admission: RE | Admit: 2020-03-24 | Discharge: 2020-03-24 | Disposition: A | Discharge status: Home / Self Care | Payer: Medicare Other | Source: Ambulatory Visit | Attending: Nephrology | Admitting: Nephrology

## 2020-03-24 ENCOUNTER — Encounter (HOSPITAL_COMMUNITY): Payer: Self-pay

## 2020-03-24 DIAGNOSIS — N184 Chronic kidney disease, stage 4 (severe): Secondary | ICD-10-CM | POA: Insufficient documentation

## 2020-03-24 DIAGNOSIS — D631 Anemia in chronic kidney disease: Secondary | ICD-10-CM | POA: Insufficient documentation

## 2020-03-24 LAB — RENAL FUNCTION PANEL
Albumin: 3.5 g/dL (ref 3.5–5.0)
Anion gap: 14 (ref 5–15)
BUN: 58 mg/dL — ABNORMAL HIGH (ref 8–23)
CO2: 21 mmol/L — ABNORMAL LOW (ref 22–32)
Calcium: 9.4 mg/dL (ref 8.9–10.3)
Chloride: 101 mmol/L (ref 98–111)
Creatinine, Ser: 4.06 mg/dL — ABNORMAL HIGH (ref 0.44–1.00)
GFR calc Af Amer: 13 mL/min — ABNORMAL LOW (ref >60)
GFR calc non Af Amer: 11 mL/min — ABNORMAL LOW (ref >60)
Glucose, Bld: 153 mg/dL — ABNORMAL HIGH (ref 70–99)
Phosphorus: 5.1 mg/dL — ABNORMAL HIGH (ref 2.5–4.6)
Potassium: 4.2 mmol/L (ref 3.5–5.1)
Sodium: 136 mmol/L (ref 135–145)

## 2020-03-24 LAB — CBC
HCT: 29.9 % — ABNORMAL LOW (ref 36.0–46.0)
Hemoglobin: 9.6 g/dL — ABNORMAL LOW (ref 12.0–15.0)
MCH: 26.8 pg (ref 26.0–34.0)
MCHC: 32.1 g/dL (ref 30.0–36.0)
MCV: 83.5 fL (ref 80.0–100.0)
Platelets: 303 10*3/uL (ref 150–400)
RBC: 3.58 MIL/uL — ABNORMAL LOW (ref 3.87–5.11)
RDW: 14.7 % (ref 11.5–15.5)
WBC: 8.9 10*3/uL (ref 4.0–10.5)
nRBC: 0 % (ref 0.0–0.2)

## 2020-03-24 LAB — PROTEIN / CREATININE RATIO, URINE
Creatinine, Urine: 35.55 mg/dL
Protein Creatinine Ratio: 5.91 mg/mg{Cre} — ABNORMAL HIGH (ref 0.00–0.15)
Total Protein, Urine: 210 mg/dL

## 2020-03-24 LAB — POCT HEMOGLOBIN-HEMACUE: Hemoglobin: 9.8 g/dL — ABNORMAL LOW (ref 12.0–15.0)

## 2020-03-24 MED ORDER — EPOETIN ALFA 3000 UNIT/ML IJ SOLN
3000.0000 [IU] | Freq: Once | INTRAMUSCULAR | Status: AC
  Administered 2020-03-24: 3000 [IU] via SUBCUTANEOUS
  Filled 2020-03-24: qty 1

## 2020-03-25 ENCOUNTER — Other Ambulatory Visit: Payer: Self-pay

## 2020-03-25 ENCOUNTER — Encounter (HOSPITAL_COMMUNITY)
Admission: RE | Disposition: A | Discharge status: Home / Self Care | Payer: Self-pay | Source: Home / Self Care | Attending: Internal Medicine

## 2020-03-25 ENCOUNTER — Ambulatory Visit (HOSPITAL_COMMUNITY)
Admission: RE | Admit: 2020-03-25 | Discharge: 2020-03-25 | Disposition: A | Discharge status: Home / Self Care | Payer: Medicare Other | Attending: Internal Medicine | Admitting: Internal Medicine

## 2020-03-25 ENCOUNTER — Encounter (HOSPITAL_COMMUNITY): Payer: Self-pay | Admitting: Internal Medicine

## 2020-03-25 DIAGNOSIS — K219 Gastro-esophageal reflux disease without esophagitis: Secondary | ICD-10-CM | POA: Diagnosis not present

## 2020-03-25 DIAGNOSIS — Z794 Long term (current) use of insulin: Secondary | ICD-10-CM | POA: Diagnosis not present

## 2020-03-25 DIAGNOSIS — K635 Polyp of colon: Secondary | ICD-10-CM | POA: Diagnosis not present

## 2020-03-25 DIAGNOSIS — J449 Chronic obstructive pulmonary disease, unspecified: Secondary | ICD-10-CM | POA: Insufficient documentation

## 2020-03-25 DIAGNOSIS — Z79899 Other long term (current) drug therapy: Secondary | ICD-10-CM | POA: Diagnosis not present

## 2020-03-25 DIAGNOSIS — Z7982 Long term (current) use of aspirin: Secondary | ICD-10-CM | POA: Diagnosis not present

## 2020-03-25 DIAGNOSIS — D638 Anemia in other chronic diseases classified elsewhere: Secondary | ICD-10-CM | POA: Diagnosis not present

## 2020-03-25 DIAGNOSIS — Z79890 Hormone replacement therapy (postmenopausal): Secondary | ICD-10-CM | POA: Insufficient documentation

## 2020-03-25 DIAGNOSIS — D509 Iron deficiency anemia, unspecified: Secondary | ICD-10-CM | POA: Diagnosis not present

## 2020-03-25 DIAGNOSIS — D120 Benign neoplasm of cecum: Secondary | ICD-10-CM | POA: Diagnosis not present

## 2020-03-25 DIAGNOSIS — Z7951 Long term (current) use of inhaled steroids: Secondary | ICD-10-CM | POA: Insufficient documentation

## 2020-03-25 DIAGNOSIS — D123 Benign neoplasm of transverse colon: Secondary | ICD-10-CM | POA: Insufficient documentation

## 2020-03-25 DIAGNOSIS — E119 Type 2 diabetes mellitus without complications: Secondary | ICD-10-CM | POA: Insufficient documentation

## 2020-03-25 DIAGNOSIS — K5900 Constipation, unspecified: Secondary | ICD-10-CM | POA: Diagnosis not present

## 2020-03-25 DIAGNOSIS — I10 Essential (primary) hypertension: Secondary | ICD-10-CM | POA: Diagnosis not present

## 2020-03-25 DIAGNOSIS — M1990 Unspecified osteoarthritis, unspecified site: Secondary | ICD-10-CM | POA: Insufficient documentation

## 2020-03-25 HISTORY — PX: POLYPECTOMY: SHX5525

## 2020-03-25 HISTORY — PX: COLONOSCOPY: SHX5424

## 2020-03-25 LAB — GLUCOSE, CAPILLARY: Glucose-Capillary: 154 mg/dL — ABNORMAL HIGH (ref 70–99)

## 2020-03-25 SURGERY — COLONOSCOPY
Anesthesia: Moderate Sedation

## 2020-03-25 MED ORDER — MIDAZOLAM HCL 5 MG/5ML IJ SOLN
INTRAMUSCULAR | Status: DC | PRN
  Administered 2020-03-25 (×2): 2 mg via INTRAVENOUS

## 2020-03-25 MED ORDER — MEPERIDINE HCL 50 MG/ML IJ SOLN
INTRAMUSCULAR | Status: AC
  Filled 2020-03-25: qty 1

## 2020-03-25 MED ORDER — ONDANSETRON HCL 4 MG/2ML IJ SOLN
INTRAMUSCULAR | Status: AC
  Filled 2020-03-25: qty 2

## 2020-03-25 MED ORDER — STERILE WATER FOR IRRIGATION IR SOLN
Status: DC | PRN
  Administered 2020-03-25: 1.5 mL

## 2020-03-25 MED ORDER — MEPERIDINE HCL 100 MG/ML IJ SOLN
INTRAMUSCULAR | Status: DC | PRN
  Administered 2020-03-25: 25 mg via INTRAVENOUS

## 2020-03-25 MED ORDER — ONDANSETRON HCL 4 MG/2ML IJ SOLN
INTRAMUSCULAR | Status: DC | PRN
  Administered 2020-03-25: 4 mg via INTRAVENOUS

## 2020-03-25 MED ORDER — SODIUM CHLORIDE 0.9 % IV SOLN
INTRAVENOUS | Status: DC
  Administered 2020-03-25: 20 mL via INTRAVENOUS

## 2020-03-25 MED ORDER — MIDAZOLAM HCL 5 MG/5ML IJ SOLN
INTRAMUSCULAR | Status: AC
  Filled 2020-03-25: qty 10

## 2020-03-25 NOTE — H&P (Signed)
@LOGO @   Primary Care Physician:  Monico Blitz, MD Primary Gastroenterologist:  Dr. Gala Romney  Pre-Procedure History & Physical: HPI:  Cindy Park is a 64 y.o. female here for diagnostic colonoscopy to further evaluate iron deficiency anemia and anemia of chronic disease.  Only GI symptom is occasional constipation.  Past Medical History:  Diagnosis Date  . Anemia   . Arthritis   . Asthma   . COPD (chronic obstructive pulmonary disease) ([place])   . Diabetes mellitus without complication ([place])   . Fluid excess   . GERD (gastroesophageal reflux disease)   . Goiter   . Hypertension   . Renal failure     Past Surgical History:  Procedure Laterality Date  . ABDOMINAL HYSTERECTOMY    . CHOLECYSTECTOMY      Prior to Admission medications   Medication Sig Start Date End Date Taking? Authorizing Provider  albuterol (PROVENTIL) (2.5 MG/3ML) 0.083% nebulizer solution Take 2.5 mg by nebulization 4 (four) times daily.   Yes [provider]  albuterol (VENTOLIN HFA) 108 (90 Base) MCG/ACT inhaler Inhale 2 puffs into the lungs every 6 (six) hours as needed for wheezing or shortness of breath.   Yes [provider]  amLODipine (NORVASC) 5 MG tablet Take 5 mg by mouth 2 (two) times daily.   Yes [provider]  aspirin EC 81 MG tablet Take 81 mg by mouth daily.   Yes [provider]  atorvastatin (LIPITOR) 40 MG tablet Take 40 mg by mouth at bedtime.   Yes [provider]  calcitRIOL (ROCALTROL) 0.25 MCG capsule Take 0.25 mcg by mouth daily.   Yes [provider]  cholecalciferol (VITAMIN D3) 25 MCG (1000 UNIT) tablet Take 2,000 Units by mouth in the morning and at bedtime.   Yes [provider]  clonazePAM (KLONOPIN) 0.5 MG tablet Take 0.5 mg by mouth daily.   Yes [provider]  dicyclomine (BENTYL) 10 MG capsule Take 10 mg by mouth 2 (two) times daily.    Yes [provider]  epoetin alfa (EPOGEN) 3000 UNIT/ML  injection Inject 3,000 Units into the vein every 14 (fourteen) days.   Yes Bhutani, Manpreet S, MD  ferrous sulfate 325 (65 FE) MG tablet Take 325 mg by mouth 2 (two) times daily with a meal.   Yes [provider]  Fluticasone-Umeclidin-Vilant (TRELEGY ELLIPTA) 100-62.5-25 MCG/INH AEPB Inhale 1 puff into the lungs daily.   Yes [provider]  furosemide (LASIX) 40 MG tablet Take 80 mg by mouth 2 (two) times daily.    Yes [provider]  gabapentin (NEURONTIN) 300 MG capsule Take 300 mg by mouth 2 (two) times daily.    Yes [provider]  gemfibrozil (LOPID) 600 MG tablet Take 600 mg by mouth 2 (two) times daily before a meal.   Yes [provider]  Insulin Lispro Prot & Lispro (HUMALOG MIX 75/25 [place]) Inject 15-60 Units into the skin 3 (three) times daily. 60 unit am 15 units lunch 60 units supper   Yes [provider]  levothyroxine (SYNTHROID) 50 MCG tablet Take 50 mcg by mouth daily. 03/05/20  Yes [provider]  montelukast (SINGULAIR) 10 MG tablet Take 10 mg by mouth at bedtime.   Yes [provider]  Omeprazole 20 MG TBEC Take 20 mg by mouth daily.    Yes [provider]  sitaGLIPtin (JANUVIA) 25 MG tablet Take 25 mg by mouth daily.   Yes [provider]  sodium bicarbonate  650 MG tablet Take 650 mg by mouth 2 (two) times daily.   Yes [provider]  vitamin B-12 (CYANOCOBALAMIN) 1000 MCG tablet Take 1,000 mcg by mouth daily.    Yes [provider]    Allergies as of 02/26/2020 - Review Complete 02/25/2020  Allergen Reaction Noted  . Niacin Other (See Comments)   . Bactrim [sulfamethoxazole-trimethoprim] Swelling and Rash 01/24/2020    Family History  Problem Relation Age of Onset  . Kidney disease Mother        ESRD  . Kidney disease Sister        ESRD  . Colon polyps Brother 49  . Kidney disease Maternal Grandmother        ESRD  . Colon cancer Neg Hx     Social  History   Socioeconomic History  . Marital status: Married    Spouse name: Not on file  . Number of children: Not on file  . Years of education: Not on file  . Highest education level: Not on file  Occupational History  . Not on file  Tobacco Use  . Smoking status: Never Smoker  . Smokeless tobacco: Never Used  Vaping Use  . Vaping Use: Never used  Substance and Sexual Activity  . Alcohol use: No  . Drug use: No  . Sexual activity: Not on file  Other Topics Concern  . Not on file  Social History Narrative  . Not on file   Social Determinants of Health   Financial Resource Strain:   . Difficulty of Paying Living Expenses:   Food Insecurity:   . Worried About Charity fundraiser in the Last Year:   . Arboriculturist in the Last Year:   Transportation Needs:   . Film/video editor (Medical):   Marland Kitchen Lack of Transportation (Non-Medical):   Physical Activity:   . Days of Exercise per Week:   . Minutes of Exercise per Session:   Stress:   . Feeling of Stress :   Social Connections:   . Frequency of Communication with Friends and Family:   . Frequency of Social Gatherings with Friends and Family:   . Attends Religious Services:   . Active Member of Clubs or Organizations:   . Attends Archivist Meetings:   Marland Kitchen Marital Status:   Intimate Partner Violence:   . Fear of Current or Ex-Partner:   . Emotionally Abused:   Marland Kitchen Physically Abused:   . Sexually Abused:     Review of Systems: See HPI, otherwise negative ROS  Physical Exam: BP 132/68   Pulse 73   Temp 97.8 F (36.6 C) (Oral)   Resp 15   Ht 5\' 7"  (1.702 m)   Wt 98.9 kg   SpO2 99%   BMI 34.14 kg/m  General:   Alert,  Well-developed, well-nourished, pleasant and cooperative in NAD Neck:  Supple; no masses or thyromegaly. No significant cervical adenopathy. Lungs:  Clear throughout to auscultation.   No wheezes, crackles, or rhonchi. No acute distress. Heart:  Regular rate and rhythm; no murmurs,  clicks, rubs,  or gallops. Abdomen: Non-distended, normal bowel sounds.  Soft and nontender without appreciable mass or hepatosplenomegaly.  Pulses:  Normal pulses noted. Extremities:  Without clubbing or edema.  Impression/Plan: 64 year old lady with iron deficiency anemia and anemia of chronic disease; here for diagnostic colonoscopy. The risks, benefits, limitations, alternatives and imponderables have been reviewed with the patient. Questions have been answered. All parties are agreeable.  Notice: This dictation was prepared with Dragon dictation along with smaller phrase technology. Any transcriptional errors that result from this process are unintentional and may not be corrected upon review.

## 2020-03-25 NOTE — Discharge Instructions (Signed)
Colonoscopy Discharge Instructions  Read the instructions outlined below and refer to this sheet in the next few weeks. These discharge instructions provide you with general information on caring for yourself after you leave the hospital. Your doctor may also give you specific instructions. While your treatment has been planned according to the most current medical practices available, unavoidable complications occasionally occur. If you have any problems or questions after discharge, call Dr. Gala Romney at [phone]. ACTIVITY  You may resume your regular activity, but move at a slower pace for the next 24 hours.   Take frequent rest periods for the next 24 hours.   Walking will help get rid of the air and reduce the bloated feeling in your belly (abdomen).   No driving for 24 hours (because of the medicine (anesthesia) used during the test).    Do not sign any important legal documents or operate any machinery for 24 hours (because of the anesthesia used during the test).  NUTRITION  Drink plenty of fluids.   You may resume your normal diet as instructed by your doctor.   Begin with a light meal and progress to your normal diet. Heavy or fried foods are harder to digest and may make you feel sick to your stomach (nauseated).   Avoid alcoholic beverages for 24 hours or as instructed.  MEDICATIONS  You may resume your normal medications unless your doctor tells you otherwise.  WHAT YOU CAN EXPECT TODAY  Some feelings of bloating in the abdomen.   Passage of more gas than usual.   Spotting of blood in your stool or on the toilet paper.  IF YOU HAD POLYPS REMOVED DURING THE COLONOSCOPY:  No aspirin products for 7 days or as instructed.   No alcohol for 7 days or as instructed.   Eat a soft diet for the next 24 hours.  FINDING OUT THE RESULTS OF YOUR TEST Not all test results are available during your visit. If your test results are not back during the visit, make an appointment  with your caregiver to find out the results. Do not assume everything is normal if you have not heard from your caregiver or the medical facility. It is important for you to follow up on all of your test results.  SEEK IMMEDIATE MEDICAL ATTENTION IF:  You have more than a spotting of blood in your stool.   Your belly is swollen (abdominal distention).   You are nauseated or vomiting.   You have a temperature over 101.   You have abdominal pain or discomfort that is severe or gets worse throughout the day.   Colon polyp information provided  Continue to use stool softener every day and utilize MiraLAX on an as-needed basis for constipation  Further recommendations to follow pending review of pathology report  Office visit with [place] in 3 months  At patient request, I called Terry at [phone] findings/recommendations.     Colon Polyps  Polyps are tissue growths inside the body. Polyps can grow in many places, including the large intestine (colon). A polyp may be a round bump or a mushroom-shaped growth. You could have one polyp or several. Most colon polyps are noncancerous (benign). However, some colon polyps can become cancerous over time. Finding and removing the polyps early can help prevent this. What are the causes? The exact cause of colon polyps is not known. What increases the risk? You are more likely to develop this condition if you:  Have a family history of colon  cancer or colon polyps.  Are older than 50 or older than 45 if you are African American.  Have inflammatory bowel disease, such as ulcerative colitis or Crohn's disease.  Have certain hereditary conditions, such as: ? Familial adenomatous polyposis. ? Lynch syndrome. ? Turcot syndrome. ? Peutz-Jeghers syndrome.  Are overweight.  Smoke cigarettes.  Do not get enough exercise.  Drink too much alcohol.  Eat a diet that is high in fat and red meat and low in fiber.  Had childhood  cancer that was treated with abdominal radiation. What are the signs or symptoms? Most polyps do not cause symptoms. If you have symptoms, they may include:  Blood coming from your rectum when having a bowel movement.  Blood in your stool. The stool may look dark red or black.  Abdominal pain.  A change in bowel habits, such as constipation or diarrhea. How is this diagnosed? This condition is diagnosed with a colonoscopy. This is a procedure in which a lighted, flexible scope is inserted into the anus and then passed into the colon to examine the area. Polyps are sometimes found when a colonoscopy is done as part of routine cancer screening tests. How is this treated? Treatment for this condition involves removing any polyps that are found. Most polyps can be removed during a colonoscopy. Those polyps will then be tested for cancer. Additional treatment may be needed depending on the results of testing. Follow these instructions at home: Lifestyle  Maintain a healthy weight, or lose weight if recommended by your health care provider.  Exercise every day or as told by your health care provider.  Do not use any products that contain nicotine or tobacco, such as cigarettes and e-cigarettes. If you need help quitting, ask your health care provider.  If you drink alcohol, limit how much you have: ? 0-1 drink a day for women. ? 0-2 drinks a day for men.  Be aware of how much alcohol is in your drink. In the [place] one drink equals one 12 oz bottle of beer (355 mL), one 5 oz glass of wine (148 mL), or one 1 oz shot of hard liquor (44 mL). Eating and drinking   Eat foods that are high in fiber, such as fruits, vegetables, and whole grains.  Eat foods that are high in calcium and vitamin D, such as milk, cheese, yogurt, eggs, liver, fish, and broccoli.  Limit foods that are high in fat, such as fried foods and desserts.  Limit the amount of red meat and processed meat you eat, such as  hot dogs, sausage, bacon, and lunch meats. General instructions  Keep all follow-up visits as told by your health care provider. This is important. ? This includes having regularly scheduled colonoscopies. ? Talk to your health care provider about when you need a colonoscopy. Contact a health care provider if:  You have new or worsening bleeding during a bowel movement.  You have new or increased blood in your stool.  You have a change in bowel habits.  You lose weight for no known reason. Summary  Polyps are tissue growths inside the body. Polyps can grow in many places, including the colon.  Most colon polyps are noncancerous (benign), but some can become cancerous over time.  This condition is diagnosed with a colonoscopy.  Treatment for this condition involves removing any polyps that are found. Most polyps can be removed during a colonoscopy. This information is not intended to replace advice given to you by your  health care provider. Make sure you discuss any questions you have with your health care provider. Document Revised: 11/23/2017 Document Reviewed: 11/23/2017 Elsevier Patient Education  [place].

## 2020-03-25 NOTE — Op Note (Signed)
[facility] Patient Name: Cindy Park Procedure Date: 03/25/2020 10:02 AM MRN: [id number] Date of Birth: 06/19/1956 Attending MD: Norvel Richards , MD CSN: [id number] Age: 64 Admit Type: Outpatient Procedure:                Colonoscopy Indications:              Iron deficiency anemia Providers:                Norvel Richards, MD, Jeanann Lewandowsky. Sharon Seller, RN,                            Nelma Rothman, Technician Referring MD:              Medicines:                Midazolam 4 mg IV, Meperidine 25 mg IV Complications:            No immediate complications. Estimated Blood Loss:     Estimated blood loss was minimal. Procedure:                Pre-Anesthesia Assessment:                           - Prior to the procedure, a History and Physical                            was performed, and patient medications and                            allergies were reviewed. The patient's tolerance of                            previous anesthesia was also reviewed. The risks                            and benefits of the procedure and the sedation                            options and risks were discussed with the patient.                            All questions were answered, and informed consent                            was obtained. Prior Anticoagulants: The patient has                            taken no previous anticoagulant or antiplatelet                            agents. ASA Grade Assessment: III - A patient with                            severe systemic disease. After reviewing the risks  and benefits, the patient was deemed in                            satisfactory condition to undergo the procedure.                           After obtaining informed consent, the colonoscope                            was passed under direct vision. Throughout the                            procedure, the patient's blood pressure, pulse, and                             oxygen saturations were monitored continuously. The                            CF-HQ190L ([id number]) scope was introduced through                            the anus and advanced to the the cecum, identified                            by appendiceal orifice and ileocecal valve. The                            colonoscopy was performed without difficulty. The                            patient tolerated the procedure well. The quality                            of the bowel preparation was adequate. Scope In: 10:12:49 AM Scope Out: 10:25:02 AM Scope Withdrawal Time: 0 hours 6 minutes 33 seconds  Total Procedure Duration: 0 hours 12 minutes 13 seconds  Findings:      The perianal and digital rectal examinations were normal.      Two sessile polyps were found in the splenic flexure and ileocecal       valve. The polyps were 3 to 5 mm in size. These polyps were removed with       a cold snare. Resection and retrieval were complete. Estimated blood       loss was minimal.      The exam was otherwise without abnormality on direct and retroflexion       views. Impression:               - Two 3 to 5 mm polyps at the splenic flexure and                            at the ileocecal valve, removed with a cold snare.                            Resected and retrieved.                           -  The examination was otherwise normal on direct                            and retroflexion views. Moderate Sedation:      Moderate (conscious) sedation was administered by the endoscopy nurse       and supervised by the endoscopist. The following parameters were       monitored: oxygen saturation, heart rate, blood pressure, respiratory       rate, EKG, adequacy of pulmonary ventilation, and response to care.       Total physician intraservice time was 18 minutes. Recommendation:           - Patient has a contact number available for                            emergencies. The signs and symptoms of potential                             delayed complications were discussed with the                            patient. Return to normal activities tomorrow.                            Written discharge instructions were provided to the                            patient.                           - Resume previous diet.                           - Continue present medications.                           - Repeat colonoscopy date to be determined after                            pending pathology results are reviewed for                            surveillance based on pathology results.                           - Return to GI office in 3 months. Procedure Code(s):        --- Professional ---                           [phone], Colonoscopy, flexible; with removal of                            tumor(s), polyp(s), or other lesion(s) by snare                            technique  G0500, Moderate sedation services provided by the                            same physician or other qualified health care                            professional performing a gastrointestinal                            endoscopic service that sedation supports,                            requiring the presence of an independent trained                            observer to assist in the monitoring of the                            patient's level of consciousness and physiological                            status; initial 15 minutes of intra-service time;                            patient age 36 years or older (additional time may                            be reported with [phone], as appropriate) Diagnosis Code(s):        --- Professional ---                           K63.5, Polyp of colon                           D50.9, Iron deficiency anemia, unspecified CPT copyright 2019 American Medical Association. All rights reserved. The codes documented in this report are preliminary and upon coder review may   be revised to meet current compliance requirements. Cristopher Estimable. Levern Pitter, MD Norvel Richards, MD 03/25/2020 10:33:32 AM This report has been signed electronically. Number of Addenda: 0

## 2020-03-26 DIAGNOSIS — N185 Chronic kidney disease, stage 5: Secondary | ICD-10-CM | POA: Diagnosis not present

## 2020-03-26 DIAGNOSIS — I10 Essential (primary) hypertension: Secondary | ICD-10-CM | POA: Diagnosis not present

## 2020-03-26 DIAGNOSIS — I313 Pericardial effusion (noninflammatory): Secondary | ICD-10-CM | POA: Diagnosis not present

## 2020-03-26 LAB — SURGICAL PATHOLOGY

## 2020-03-27 ENCOUNTER — Encounter: Payer: Self-pay | Admitting: Internal Medicine

## 2020-03-30 ENCOUNTER — Encounter (HOSPITAL_COMMUNITY): Payer: Self-pay | Admitting: Internal Medicine

## 2020-03-30 DIAGNOSIS — E1142 Type 2 diabetes mellitus with diabetic polyneuropathy: Secondary | ICD-10-CM | POA: Diagnosis not present

## 2020-03-30 DIAGNOSIS — I10 Essential (primary) hypertension: Secondary | ICD-10-CM | POA: Diagnosis not present

## 2020-03-30 DIAGNOSIS — Z299 Encounter for prophylactic measures, unspecified: Secondary | ICD-10-CM | POA: Diagnosis not present

## 2020-03-30 DIAGNOSIS — E1165 Type 2 diabetes mellitus with hyperglycemia: Secondary | ICD-10-CM | POA: Diagnosis not present

## 2020-03-30 DIAGNOSIS — I313 Pericardial effusion (noninflammatory): Secondary | ICD-10-CM | POA: Diagnosis not present

## 2020-04-07 ENCOUNTER — Encounter (HOSPITAL_COMMUNITY): Payer: Self-pay

## 2020-04-07 ENCOUNTER — Other Ambulatory Visit: Payer: Self-pay

## 2020-04-07 ENCOUNTER — Encounter (HOSPITAL_COMMUNITY)
Admission: RE | Admit: 2020-04-07 | Discharge: 2020-04-07 | Disposition: A | Discharge status: Home / Self Care | Payer: Medicare Other | Source: Ambulatory Visit | Attending: Nephrology | Admitting: Nephrology

## 2020-04-07 DIAGNOSIS — D631 Anemia in chronic kidney disease: Secondary | ICD-10-CM | POA: Diagnosis not present

## 2020-04-07 DIAGNOSIS — N184 Chronic kidney disease, stage 4 (severe): Secondary | ICD-10-CM | POA: Diagnosis not present

## 2020-04-07 LAB — POCT HEMOGLOBIN-HEMACUE: Hemoglobin: 10.3 g/dL — ABNORMAL LOW (ref 12.0–15.0)

## 2020-04-07 MED ORDER — EPOETIN ALFA 3000 UNIT/ML IJ SOLN: 3000.0000 [IU] | Freq: Once | INTRAMUSCULAR | Status: DC

## 2020-04-08 DIAGNOSIS — J449 Chronic obstructive pulmonary disease, unspecified: Secondary | ICD-10-CM | POA: Diagnosis not present

## 2020-04-08 DIAGNOSIS — E119 Type 2 diabetes mellitus without complications: Secondary | ICD-10-CM | POA: Diagnosis not present

## 2020-04-09 DIAGNOSIS — H524 Presbyopia: Secondary | ICD-10-CM | POA: Diagnosis not present

## 2020-04-09 DIAGNOSIS — E083311 Diabetes mellitus due to underlying condition with moderate nonproliferative diabetic retinopathy with macular edema, right eye: Secondary | ICD-10-CM | POA: Diagnosis not present

## 2020-04-21 ENCOUNTER — Encounter (HOSPITAL_COMMUNITY): Payer: Self-pay

## 2020-04-21 ENCOUNTER — Encounter (HOSPITAL_COMMUNITY)
Admission: RE | Admit: 2020-04-21 | Discharge: 2020-04-21 | Disposition: A | Discharge status: Home / Self Care | Payer: Medicare Other | Source: Ambulatory Visit | Attending: Nephrology | Admitting: Nephrology

## 2020-04-21 ENCOUNTER — Other Ambulatory Visit: Payer: Self-pay

## 2020-04-21 DIAGNOSIS — N184 Chronic kidney disease, stage 4 (severe): Secondary | ICD-10-CM | POA: Diagnosis not present

## 2020-04-21 DIAGNOSIS — I10 Essential (primary) hypertension: Secondary | ICD-10-CM | POA: Diagnosis not present

## 2020-04-21 DIAGNOSIS — D631 Anemia in chronic kidney disease: Secondary | ICD-10-CM | POA: Diagnosis not present

## 2020-04-21 LAB — POCT HEMOGLOBIN-HEMACUE: Hemoglobin: 9.2 g/dL — ABNORMAL LOW (ref 12.0–15.0)

## 2020-04-21 MED ORDER — EPOETIN ALFA 3000 UNIT/ML IJ SOLN
3000.0000 [IU] | Freq: Once | INTRAMUSCULAR | Status: AC
  Administered 2020-04-21: 3000 [IU] via SUBCUTANEOUS
  Filled 2020-04-21: qty 1

## 2020-04-22 ENCOUNTER — Encounter (INDEPENDENT_AMBULATORY_CARE_PROVIDER_SITE_OTHER): Payer: Self-pay | Admitting: Ophthalmology

## 2020-04-24 DIAGNOSIS — N189 Chronic kidney disease, unspecified: Secondary | ICD-10-CM | POA: Diagnosis not present

## 2020-04-24 DIAGNOSIS — E211 Secondary hyperparathyroidism, not elsewhere classified: Secondary | ICD-10-CM | POA: Diagnosis not present

## 2020-04-24 DIAGNOSIS — E1122 Type 2 diabetes mellitus with diabetic chronic kidney disease: Secondary | ICD-10-CM | POA: Diagnosis not present

## 2020-04-24 DIAGNOSIS — N170 Acute kidney failure with tubular necrosis: Secondary | ICD-10-CM | POA: Diagnosis not present

## 2020-04-24 DIAGNOSIS — R809 Proteinuria, unspecified: Secondary | ICD-10-CM | POA: Diagnosis not present

## 2020-04-29 DIAGNOSIS — R809 Proteinuria, unspecified: Secondary | ICD-10-CM | POA: Diagnosis not present

## 2020-04-29 DIAGNOSIS — I129 Hypertensive chronic kidney disease with stage 1 through stage 4 chronic kidney disease, or unspecified chronic kidney disease: Secondary | ICD-10-CM | POA: Diagnosis not present

## 2020-04-29 DIAGNOSIS — N185 Chronic kidney disease, stage 5: Secondary | ICD-10-CM | POA: Diagnosis not present

## 2020-04-29 DIAGNOSIS — D631 Anemia in chronic kidney disease: Secondary | ICD-10-CM | POA: Diagnosis not present

## 2020-04-29 DIAGNOSIS — E211 Secondary hyperparathyroidism, not elsewhere classified: Secondary | ICD-10-CM | POA: Diagnosis not present

## 2020-05-05 ENCOUNTER — Encounter (HOSPITAL_COMMUNITY)
Admission: RE | Admit: 2020-05-05 | Discharge: 2020-05-05 | Disposition: A | Discharge status: Home / Self Care | Payer: Medicare Other | Source: Ambulatory Visit | Attending: Nephrology | Admitting: Nephrology

## 2020-05-05 ENCOUNTER — Other Ambulatory Visit: Payer: Self-pay

## 2020-05-05 ENCOUNTER — Encounter (HOSPITAL_COMMUNITY): Payer: Self-pay

## 2020-05-05 DIAGNOSIS — D631 Anemia in chronic kidney disease: Secondary | ICD-10-CM | POA: Diagnosis not present

## 2020-05-05 DIAGNOSIS — N184 Chronic kidney disease, stage 4 (severe): Secondary | ICD-10-CM | POA: Diagnosis not present

## 2020-05-05 LAB — POCT HEMOGLOBIN-HEMACUE: Hemoglobin: 9.4 g/dL — ABNORMAL LOW (ref 12.0–15.0)

## 2020-05-05 MED ORDER — EPOETIN ALFA 3000 UNIT/ML IJ SOLN
3000.0000 [IU] | Freq: Once | INTRAMUSCULAR | Status: AC
  Administered 2020-05-05: 3000 [IU] via SUBCUTANEOUS
  Filled 2020-05-05: qty 1

## 2020-05-05 NOTE — Progress Notes (Signed)
[facility] Note  05/06/2020     CHIEF COMPLAINT Patient presents for Retina Evaluation   HISTORY OF PRESENT ILLNESS: Cindy Park is a 64 y.o. female who presents to the clinic today for:   HPI    Retina Evaluation    In both eyes.  Onset: Unknown.  Duration of 4 weeks.  Context:  distance vision, mid-range vision and near vision.  Treatments tried include no treatments.  I, the attending physician,  performed the HPI with the patient and updated documentation appropriately.          Comments    64 y/o female pt referred by Dr. Leticia Clas on 8.19.21 for eval of BDR w/CSME.  VA slightly blurred OU.  Denies pain, FOL, floaters.  Eyes sometimes feel dry and sting.  No gtts.  BS 99 this a.m.  A1C 6.9.       Last edited by Bernarda Caffey, MD on 05/07/2020  7:24 PM. (History)    Pt feels VA is good OU, but notices occasional difficulty focusing at near, especially when eyes are fatigued.  Referring physician: Leticia Clas OD [address], [place] [address]  HISTORICAL INFORMATION:   Selected notes from the MEDICAL RECORD NUMBER Referred by Dr. Leticia Clas for DM eval LEE: 08.19.2021 BCVA OD: 20/20 OS: 20/20 Ocular Hx- cataract, CWS, macular edema PMH- DM, HTN, stage 3 kidney failure, CHF    CURRENT MEDICATIONS: No current outpatient medications on file. (Ophthalmic Drugs)   No current facility-administered medications for this visit. (Ophthalmic Drugs)   Current Outpatient Medications (Other)  Medication Sig  . acetaminophen (TYLENOL) 500 MG tablet Take by mouth.  Marland Kitchen albuterol (PROVENTIL) (2.5 MG/3ML) 0.083% nebulizer solution Take 2.5 mg by nebulization 4 (four) times daily.  Marland Kitchen albuterol (VENTOLIN HFA) 108 (90 Base) MCG/ACT inhaler Inhale 2 puffs into the lungs every 6 (six) hours as needed for wheezing or shortness of breath.  Marland Kitchen amLODipine (NORVASC) 5 MG tablet Take 5 mg by mouth 2 (two) times daily.  Marland Kitchen aspirin EC 81 MG  tablet Take 81 mg by mouth daily.  Marland Kitchen atorvastatin (LIPITOR) 40 MG tablet Take 40 mg by mouth at bedtime.  . budesonide (PULMICORT) 1 MG/2ML nebulizer solution   . calcitRIOL (ROCALTROL) 0.25 MCG capsule Take 0.25 mcg by mouth daily.  . carvedilol (COREG) 12.5 MG tablet Take by mouth.  . cholecalciferol (VITAMIN D3) 25 MCG (1000 UNIT) tablet Take 2,000 Units by mouth in the morning and at bedtime.  . clonazePAM (KLONOPIN) 0.5 MG tablet Take 0.5 mg by mouth daily.  Marland Kitchen dicyclomine (BENTYL) 10 MG capsule Take 10 mg by mouth 2 (two) times daily.   Marland Kitchen epoetin alfa (EPOGEN) 3000 UNIT/ML injection Inject 3,000 Units into the vein every 14 (fourteen) days.  . ferrous sulfate 325 (65 FE) MG tablet Take 325 mg by mouth 2 (two) times daily with a meal.  . Fluticasone-Umeclidin-Vilant (TRELEGY ELLIPTA) 100-62.5-25 MCG/INH AEPB Inhale 1 puff into the lungs daily.  . furosemide (LASIX) 40 MG tablet Take 80 mg by mouth 2 (two) times daily.   Marland Kitchen gabapentin (NEURONTIN) 300 MG capsule Take 300 mg by mouth 2 (two) times daily.   Marland Kitchen gemfibrozil (LOPID) 600 MG tablet Take 600 mg by mouth 2 (two) times daily before a meal.  . hydrALAZINE (APRESOLINE) 50 MG tablet Take 50 mg by mouth 2 (two) times daily.  . Insulin Lispro Prot & Lispro (HUMALOG MIX 75/25 [place])  Inject 15-60 Units into the skin 3 (three) times daily. 60 unit am 15 units lunch 60 units supper  . insulin NPH-regular Human (70-30) 100 UNIT/ML injection Inject into the skin.  Marland Kitchen levofloxacin (LEVAQUIN) 750 MG tablet Take 750 mg by mouth daily.  Marland Kitchen levothyroxine (SYNTHROID) 50 MCG tablet Take 50 mcg by mouth daily.  . montelukast (SINGULAIR) 10 MG tablet Take 10 mg by mouth at bedtime.  . Omeprazole 20 MG TBEC Take 20 mg by mouth daily.   . sitaGLIPtin (JANUVIA) 25 MG tablet Take 25 mg by mouth daily.  . sodium bicarbonate 650 MG tablet Take 650 mg by mouth 2 (two) times daily.  . vitamin B-12 (CYANOCOBALAMIN) 1000 MCG tablet Take 1,000 mcg by mouth daily.     No current facility-administered medications for this visit. (Other)      REVIEW OF SYSTEMS: ROS    Positive for: Gastrointestinal, Endocrine, Eyes, Respiratory   Negative for: Constitutional, Neurological, Skin, Genitourinary, Musculoskeletal, HENT, Cardiovascular, Psychiatric, Allergic/Imm, Heme/Lymph   Last edited by Matthew Folks, COA on 05/06/2020  2:08 PM. (History)       ALLERGIES Allergies  Allergen Reactions  . Niacin Other (See Comments)    Burning  . Other Dermatitis    Powder in the gloves   . Tomato Other (See Comments)    Breaks pt out   . Bactrim [Sulfamethoxazole-Trimethoprim] Swelling and Rash    PAST MEDICAL HISTORY Past Medical History:  Diagnosis Date  . Anemia   . Arthritis   . Asthma   . Cataract    Combined form OU  . COPD (chronic obstructive pulmonary disease) ([place])   . Diabetes mellitus without complication ([place])   . Diabetic retinopathy ([place])    BDR OU  . Fluid excess   . GERD (gastroesophageal reflux disease)   . Goiter   . Hypertension   . Hypertensive retinopathy    OU  . Renal failure    Past Surgical History:  Procedure Laterality Date  . ABDOMINAL HYSTERECTOMY    . CHOLECYSTECTOMY    . COLONOSCOPY N/A 03/25/2020   Procedure: COLONOSCOPY;  Surgeon: Daneil Dolin, MD;  [facility];  Service: Endoscopy;  Laterality: N/A;  10:15am  . POLYPECTOMY  03/25/2020   Procedure: POLYPECTOMY;  Surgeon: Daneil Dolin, MD;  Location: [facility];  Service: Endoscopy;;    FAMILY HISTORY Family History  Problem Relation Age of Onset  . Kidney disease Mother        ESRD  . Kidney disease Sister        ESRD  . Colon polyps Brother 53  . Kidney disease Maternal Grandmother        ESRD  . Colon cancer Neg Hx     SOCIAL HISTORY Social History   Tobacco Use  . Smoking status: Never Smoker  . Smokeless tobacco: Never Used  Vaping Use  . Vaping Use: Never used  Substance Use Topics  . Alcohol use: No  . Drug  use: No         OPHTHALMIC EXAM:  Base Eye Exam    Visual Acuity (Snellen - Linear)      Right Left   Dist cc 20/20 -2 20/25   Dist ph cc  20/20 -2   Correction: Glasses       Tonometry (Tonopen, 2:12 PM)      Right Left   Pressure 18 16       Pupils      Dark  Light Shape React APD   Right 4 3 Round Brisk None   Left 4 3 Round Brisk None       Visual Fields (Counting fingers)      Left Right    Full Full       Extraocular Movement      Right Left    Full, Ortho Full, Ortho       Neuro/Psych    Oriented x3: Yes   Mood/Affect: Normal       Dilation    Both eyes: 1.0% Mydriacyl, 2.5% Phenylephrine @ 2:12 PM        Slit Lamp and Fundus Exam    Slit Lamp Exam      Right Left   Lids/Lashes Dermarto, mild MGD Dermarto, mild MGD   Conjunctiva/Sclera Nasal pinguecula, mild melanosis melanosis   Cornea 1+ inferior PEE 1+ inferior PEE   Anterior Chamber Deep and quiet Deep and quiet   Iris Round and dilated Round and dilated   Lens 2-3+ NS, 2-3+ cortical 2-3+ NS, 2-3+ cortical   Vitreous Synerisis Synerisis       Fundus Exam      Right Left   Disc Pink, sharp Pink, sharp, compact   C/D Ratio 0.2 0.2   Macula Blunted foveal reflex, mild rpe mottling, scattered cystic changes, scattered IRH Blunted foveal reflex, mild rpe mottling, scattered cystic changes, scattered MA   Vessels Mild attenuation and tortuoisty Attenuated, tortuous, mild copper wiring   Periphery Focal pigmented lattice IT quad; greatest @ 0730.  Attached-minimal MA Focal pigmented lattice @ 6--2 small patches.  Attached-minimal MA        Refraction    Wearing Rx      Sphere Cylinder Axis Add   Right [place] +0.50 045 +2.00   Left +0.05 +0.50 128 +2.00   Age: 62yrs   Type: PAL       Manifest Refraction      Sphere Cylinder Axis Dist VA   Right [place] +0.50 050 20/20-   Left -0.25 +0.50 100 20/20-          IMAGING AND PROCEDURES  Imaging and Procedures for 05/06/2020  OCT, Retina  - OU - Both Eyes       Right Eye Quality was good. Central Foveal Thickness: 345. Progression has no prior data. Findings include abnormal foveal contour, intraretinal fluid, no SRF (Focal IRF centrally and IN macula).   Left Eye Quality was good. Central Foveal Thickness: 311. Progression has no prior data. Findings include normal foveal contour, intraretinal fluid, no SRF, vitreomacular adhesion  (Focal cystic changes nasal and superior macula).   Notes *Images captured and stored on drive  Diagnosis / Impression:  Mod NPDR w/ DME OU OD: Focal IRF centrally and IN macula OS: Focal cystic changes nasal and superior macula  Clinical management:  See below  Abbreviations: NFP - Normal foveal profile. CME - cystoid macular edema. PED - pigment epithelial detachment. IRF - intraretinal fluid. SRF - subretinal fluid. EZ - ellipsoid zone. ERM - epiretinal membrane. ORA - outer retinal atrophy. ORT - outer retinal tubulation. SRHM - subretinal hyper-reflective material. IRHM - intraretinal hyper-reflective material        Fluorescein Angiography Optos (Transit OD)       Right Eye   Progression has no prior data. Early phase findings include microaneurysm. Mid/Late phase findings include microaneurysm, leakage (No NV).   Left Eye   Progression has no prior data. Early phase findings include microaneurysm. Mid/Late phase  findings include microaneurysm, leakage (No NV).   Notes **Images stored on drive**  Impression: Mod NPDR w/DME OU Leaking MA OU No NV OU                 ASSESSMENT/PLAN:    ICD-10-CM   1. Moderate nonproliferative diabetic retinopathy of both eyes with macular edema associated with diabetes mellitus due to underlying condition ([place])  C62.3762   2. Retinal edema  H35.81 OCT, Retina - OU - Both Eyes  3. Lattice degeneration of both retinas  H35.413   4. Essential hypertension  I10   5. Hypertensive retinopathy of both eyes  H35.033 Fluorescein  Angiography Optos (Transit OD)  6. Combined forms of age-related cataract of both eyes  H25.813   7. Dry eyes, bilateral  H04.123     1,2. Moderate non-proliferative diabetic retinopathy w/ DME OU  - Last A1c 6.9 on 12.31.2020 - The incidence, risk factors for progression, natural history and treatment options for diabetic retinopathy were discussed with patient.   - The need for close monitoring of blood glucose, blood pressure, and serum lipids, avoiding cigarette or any type of tobacco, and the need for long term follow up was also discussed with patient. - exam shows scattered cystic changes and scattered MA/IRH  OU - FA 9.15.21 shows late leaking MA OU, no NV OU - OCT shows +DME OU: focal IRF centrally and inf nasal macula OD; Focal cystic changes nasal and superior macula OS - The natural history, pathology, and characteristics of diabetic macular edema discussed with patient.  A generalized discussion of the major clinical trials concerning treatment of diabetic macular edema (ETDRS, DCT, SCORE, RISE / RIDE, and ongoing DRCR net studies) was completed.  This discussion included mention of the various approaches to treating diabetic macular edema (observation, laser photocoagulation, anti-VEGF injections with lucentis / Avastin / Eylea, steroid injections with Kenalog / Ozurdex, and intraocular surgery with vitrectomy).  The goal hemoglobin A1C of 6-7 was discussed, as well as importance of smoking cessation and hypertension control.  Need for ongoing treatment and monitoring were specifically discussed with reference to chronic nature of diabetic macular edema. - BCVA remains very good -- 20/20 OD, 20/25 OS - Pt wishes to defer tx at this time -- reasonable - f/u in 3-4 wks  3. Lattice degeneration w/ atrophic holes, OU - patches of inferior lattice OU - discussed findings, prognosis, and treatment options including observation - recommend laser retinopexy - pt wishes to proceed with  laser in 3 wks - f/u in 3 wks, sooner prn  4,5. Hypertensive retinopathy OU - discussed importance of tight BP control - monitor  6. Mixed Cataract OU - The symptoms of cataract, surgical options, and treatments and risks were discussed with patient. - discussed diagnosis and progression - not yet visually significant - monitor for now  7. Dry eyes OU - recommend artificial tears and lubricating ointment as needed   Ophthalmic Meds Ordered this visit:  No orders of the defined types were placed in this encounter.      Return in about 3 weeks (around 05/27/2020) for 3 wk f/u for lattice OU w/likely laser retinopexy w/DFE&OCT.  There are no Patient Instructions on file for this visit.   Explained the diagnoses, plan, and follow up with the patient and they expressed understanding.  Patient expressed understanding of the importance of proper follow up care.   This document serves as a record of services personally performed by Gardiner Sleeper, MD,  PhD. It was created on their behalf by Roselee Nova, COMT. The creation of this record is the provider's dictation and/or activities during the visit.  Electronically signed by: Roselee Nova, COMT 05/07/20 7:39 PM  This document serves as a record of services personally performed by Gardiner Sleeper, MD, PhD. It was created on their behalf by Estill Bakes, COT an ophthalmic technician. The creation of this record is the provider's dictation and/or activities during the visit.    Electronically signed by: Estill Bakes, COT 9.15.21 @ 7:39 PM  Gardiner Sleeper, M.D., Ph.D. Diseases & Surgery of the Retina and [place] 9.15.21  I have reviewed the above documentation for accuracy and completeness, and I agree with the above. Gardiner Sleeper, M.D., Ph.D. 05/07/20 7:39 PM   Abbreviations: M myopia (nearsighted); A astigmatism; H hyperopia (farsighted); P presbyopia; Mrx spectacle prescription;  CTL  contact lenses; OD right eye; OS left eye; OU both eyes  XT exotropia; ET esotropia; PEK punctate epithelial keratitis; PEE punctate epithelial erosions; DES dry eye syndrome; MGD meibomian gland dysfunction; ATs artificial tears; PFAT's preservative free artificial tears; Roland nuclear sclerotic cataract; PSC posterior subcapsular cataract; ERM epi-retinal membrane; PVD posterior vitreous detachment; RD retinal detachment; DM diabetes mellitus; DR diabetic retinopathy; NPDR non-proliferative diabetic retinopathy; PDR proliferative diabetic retinopathy; CSME clinically significant macular edema; DME diabetic macular edema; dbh dot blot hemorrhages; CWS cotton wool spot; POAG primary open angle glaucoma; C/D cup-to-disc ratio; HVF humphrey visual field; GVF goldmann visual field; OCT optical coherence tomography; IOP intraocular pressure; BRVO Branch retinal vein occlusion; CRVO central retinal vein occlusion; CRAO central retinal artery occlusion; BRAO branch retinal artery occlusion; RT retinal tear; SB scleral buckle; PPV pars plana vitrectomy; VH Vitreous hemorrhage; PRP panretinal laser photocoagulation; IVK intravitreal kenalog; VMT vitreomacular traction; MH Macular hole;  NVD neovascularization of the disc; NVE neovascularization elsewhere; AREDS age related eye disease study; ARMD age related macular degeneration; POAG primary open angle glaucoma; EBMD epithelial/anterior basement membrane dystrophy; ACIOL anterior chamber intraocular lens; IOL intraocular lens; PCIOL posterior chamber intraocular lens; Phaco/IOL phacoemulsification with intraocular lens placement; [place] photorefractive keratectomy; LASIK laser assisted in situ keratomileusis; HTN hypertension; DM diabetes mellitus; COPD chronic obstructive pulmonary disease

## 2020-05-06 ENCOUNTER — Encounter (INDEPENDENT_AMBULATORY_CARE_PROVIDER_SITE_OTHER): Payer: Self-pay | Admitting: Ophthalmology

## 2020-05-06 ENCOUNTER — Ambulatory Visit (INDEPENDENT_AMBULATORY_CARE_PROVIDER_SITE_OTHER): Payer: Medicare Other | Admitting: Ophthalmology

## 2020-05-06 DIAGNOSIS — H04123 Dry eye syndrome of bilateral lacrimal glands: Secondary | ICD-10-CM

## 2020-05-06 DIAGNOSIS — E083313 Diabetes mellitus due to underlying condition with moderate nonproliferative diabetic retinopathy with macular edema, bilateral: Secondary | ICD-10-CM

## 2020-05-06 DIAGNOSIS — H3581 Retinal edema: Secondary | ICD-10-CM | POA: Diagnosis not present

## 2020-05-06 DIAGNOSIS — I10 Essential (primary) hypertension: Secondary | ICD-10-CM | POA: Diagnosis not present

## 2020-05-06 DIAGNOSIS — H25813 Combined forms of age-related cataract, bilateral: Secondary | ICD-10-CM

## 2020-05-06 DIAGNOSIS — H35413 Lattice degeneration of retina, bilateral: Secondary | ICD-10-CM

## 2020-05-06 DIAGNOSIS — H35033 Hypertensive retinopathy, bilateral: Secondary | ICD-10-CM

## 2020-05-07 ENCOUNTER — Encounter (INDEPENDENT_AMBULATORY_CARE_PROVIDER_SITE_OTHER): Payer: Self-pay | Admitting: Ophthalmology

## 2020-05-13 ENCOUNTER — Ambulatory Visit: Payer: Medicare Other | Admitting: Gastroenterology

## 2020-05-15 DIAGNOSIS — I10 Essential (primary) hypertension: Secondary | ICD-10-CM | POA: Diagnosis not present

## 2020-05-15 DIAGNOSIS — Z7189 Other specified counseling: Secondary | ICD-10-CM | POA: Diagnosis not present

## 2020-05-15 DIAGNOSIS — Z299 Encounter for prophylactic measures, unspecified: Secondary | ICD-10-CM | POA: Diagnosis not present

## 2020-05-15 DIAGNOSIS — Z0000 Encounter for general adult medical examination without abnormal findings: Secondary | ICD-10-CM | POA: Diagnosis not present

## 2020-05-19 ENCOUNTER — Other Ambulatory Visit: Payer: Self-pay

## 2020-05-19 ENCOUNTER — Encounter (HOSPITAL_COMMUNITY)
Admission: RE | Admit: 2020-05-19 | Discharge: 2020-05-19 | Disposition: A | Discharge status: Home / Self Care | Payer: Medicare Other | Source: Ambulatory Visit | Attending: Nephrology | Admitting: Nephrology

## 2020-05-19 ENCOUNTER — Encounter (HOSPITAL_COMMUNITY): Payer: Self-pay

## 2020-05-19 DIAGNOSIS — D631 Anemia in chronic kidney disease: Secondary | ICD-10-CM | POA: Diagnosis not present

## 2020-05-19 DIAGNOSIS — N184 Chronic kidney disease, stage 4 (severe): Secondary | ICD-10-CM | POA: Diagnosis not present

## 2020-05-19 DIAGNOSIS — N185 Chronic kidney disease, stage 5: Secondary | ICD-10-CM

## 2020-05-19 LAB — POCT HEMOGLOBIN-HEMACUE: Hemoglobin: 9.3 g/dL — ABNORMAL LOW (ref 12.0–15.0)

## 2020-05-19 MED ORDER — EPOETIN ALFA 2000 UNIT/ML IJ SOLN
INTRAMUSCULAR | Status: AC
  Filled 2020-05-19: qty 1

## 2020-05-19 MED ORDER — EPOETIN ALFA 3000 UNIT/ML IJ SOLN
INTRAMUSCULAR | Status: AC
  Filled 2020-05-19: qty 1

## 2020-05-19 MED ORDER — EPOETIN ALFA 3000 UNIT/ML IJ SOLN
3000.0000 [IU] | Freq: Once | INTRAMUSCULAR | Status: AC
  Administered 2020-05-19: 3000 [IU] via SUBCUTANEOUS

## 2020-05-19 NOTE — Progress Notes (Signed)
[facility] Note  05/20/2020     CHIEF COMPLAINT Patient presents for Retina Follow Up   HISTORY OF PRESENT ILLNESS: Cindy Park is a 64 y.o. female who presents to the clinic today for:   HPI    Retina Follow Up    Patient presents with  Diabetic Retinopathy.  In both eyes.  This started weeks ago.  Severity is moderate.  Duration of weeks.  Since onset it is stable.  I, the attending physician,  performed the HPI with the patient and updated documentation appropriately.          Comments    Pt states vision is the same OU.  Patient denies eye pain or discomfort and denies any new or worsening floaters or fol OU       Last edited by Bernarda Caffey, MD on 05/20/2020 12:20 PM. (History)    Pt here for laser retinopexy OD  Referring physician: Leticia Clas OD [address] [place], [place] [address]  HISTORICAL INFORMATION:   Selected notes from the MEDICAL RECORD NUMBER Referred by Dr. Leticia Clas for DM eval LEE: 08.19.2021 BCVA OD: 20/20 OS: 20/20 Ocular Hx- cataract, CWS, macular edema PMH- DM, HTN, stage 3 kidney failure, CHF    CURRENT MEDICATIONS: Current Outpatient Medications (Ophthalmic Drugs)  Medication Sig  . prednisoLONE acetate (PRED FORTE) 1 % ophthalmic suspension Place 1 drop into the right eye 4 (four) times daily for 7 days.   No current facility-administered medications for this visit. (Ophthalmic Drugs)   Current Outpatient Medications (Other)  Medication Sig  . acetaminophen (TYLENOL) 500 MG tablet Take by mouth.  Marland Kitchen albuterol (PROVENTIL) (2.5 MG/3ML) 0.083% nebulizer solution Take 2.5 mg by nebulization 4 (four) times daily.  Marland Kitchen albuterol (VENTOLIN HFA) 108 (90 Base) MCG/ACT inhaler Inhale 2 puffs into the lungs every 6 (six) hours as needed for wheezing or shortness of breath.  Marland Kitchen amLODipine (NORVASC) 5 MG tablet Take 5 mg by mouth 2 (two) times daily.  Marland Kitchen aspirin EC 81 MG tablet Take 81 mg by mouth daily.  Marland Kitchen  atorvastatin (LIPITOR) 40 MG tablet Take 40 mg by mouth at bedtime.  . budesonide (PULMICORT) 1 MG/2ML nebulizer solution   . calcitRIOL (ROCALTROL) 0.25 MCG capsule Take 0.25 mcg by mouth daily.  . carvedilol (COREG) 12.5 MG tablet Take by mouth.  . cholecalciferol (VITAMIN D3) 25 MCG (1000 UNIT) tablet Take 2,000 Units by mouth in the morning and at bedtime.  . clonazePAM (KLONOPIN) 0.5 MG tablet Take 0.5 mg by mouth daily.  Marland Kitchen dicyclomine (BENTYL) 10 MG capsule Take 10 mg by mouth 2 (two) times daily.   Marland Kitchen epoetin alfa (EPOGEN) 3000 UNIT/ML injection Inject 3,000 Units into the vein every 14 (fourteen) days.  . ferrous sulfate 325 (65 FE) MG tablet Take 325 mg by mouth 2 (two) times daily with a meal.  . Fluticasone-Umeclidin-Vilant (TRELEGY ELLIPTA) 100-62.5-25 MCG/INH AEPB Inhale 1 puff into the lungs daily.  . furosemide (LASIX) 40 MG tablet Take 80 mg by mouth 2 (two) times daily.   Marland Kitchen gabapentin (NEURONTIN) 300 MG capsule Take 300 mg by mouth 2 (two) times daily.   Marland Kitchen gemfibrozil (LOPID) 600 MG tablet Take 600 mg by mouth 2 (two) times daily before a meal.  . hydrALAZINE (APRESOLINE) 50 MG tablet Take 50 mg by mouth 2 (two) times daily.  . Insulin Lispro Prot & Lispro (HUMALOG MIX 75/25 [place]) Inject 15-60 Units into the  skin 3 (three) times daily. 60 unit am 15 units lunch 60 units supper  . insulin NPH-regular Human (70-30) 100 UNIT/ML injection Inject into the skin.  Marland Kitchen levofloxacin (LEVAQUIN) 750 MG tablet Take 750 mg by mouth daily.  Marland Kitchen levothyroxine (SYNTHROID) 50 MCG tablet Take 50 mcg by mouth daily.  . montelukast (SINGULAIR) 10 MG tablet Take 10 mg by mouth at bedtime.  . Omeprazole 20 MG TBEC Take 20 mg by mouth daily.   . sitaGLIPtin (JANUVIA) 25 MG tablet Take 25 mg by mouth daily.  . sodium bicarbonate 650 MG tablet Take 650 mg by mouth 2 (two) times daily.  . vitamin B-12 (CYANOCOBALAMIN) 1000 MCG tablet Take 1,000 mcg by mouth daily.    No current facility-administered  medications for this visit. (Other)      REVIEW OF SYSTEMS: ROS    Positive for: Gastrointestinal, Endocrine, Eyes, Respiratory   Negative for: Constitutional, Neurological, Skin, Genitourinary, Musculoskeletal, HENT, Cardiovascular, Psychiatric, Allergic/Imm, Heme/Lymph   Last edited by Doneen Poisson on 05/20/2020  9:03 AM. (History)       ALLERGIES Allergies  Allergen Reactions  . Niacin Other (See Comments)    Burning  . Other Dermatitis    Powder in the gloves   . Tomato Other (See Comments)    Breaks pt out   . Bactrim [Sulfamethoxazole-Trimethoprim] Swelling and Rash    PAST MEDICAL HISTORY Past Medical History:  Diagnosis Date  . Anemia   . Arthritis   . Asthma   . Cataract    Combined form OU  . COPD (chronic obstructive pulmonary disease) ([place])   . Diabetes mellitus without complication ([place])   . Diabetic retinopathy ([place])    BDR OU  . Fluid excess   . GERD (gastroesophageal reflux disease)   . Goiter   . Hypertension   . Hypertensive retinopathy    OU  . Renal failure    Past Surgical History:  Procedure Laterality Date  . ABDOMINAL HYSTERECTOMY    . CHOLECYSTECTOMY    . COLONOSCOPY N/A 03/25/2020   Procedure: COLONOSCOPY;  Surgeon: Daneil Dolin, MD;  [facility];  Service: Endoscopy;  Laterality: N/A;  10:15am  . POLYPECTOMY  03/25/2020   Procedure: POLYPECTOMY;  Surgeon: Daneil Dolin, MD;  Location: [facility];  Service: Endoscopy;;    FAMILY HISTORY Family History  Problem Relation Age of Onset  . Kidney disease Mother        ESRD  . Kidney disease Sister        ESRD  . Colon polyps Brother 50  . Kidney disease Maternal Grandmother        ESRD  . Colon cancer Neg Hx     SOCIAL HISTORY Social History   Tobacco Use  . Smoking status: Never Smoker  . Smokeless tobacco: Never Used  Vaping Use  . Vaping Use: Never used  Substance Use Topics  . Alcohol use: No  . Drug use: No         OPHTHALMIC  EXAM:  Base Eye Exam    Visual Acuity (Snellen - Linear)      Right Left   Dist cc 20/20 -1 20/25 -2   Dist ph cc  20/20 -2   Correction: Glasses       Tonometry (Tonopen, 9:10 AM)      Right Left   Pressure 15 16       Pupils      Dark Light Shape React APD  Right 3 2 Round Brisk 0   Left 3 2 Round Brisk 0       Visual Fields      Left Right    Full Full       Extraocular Movement      Right Left    Full Full       Neuro/Psych    Oriented x3: Yes   Mood/Affect: Normal       Dilation    Both eyes: 1.0% Mydriacyl, 2.5% Phenylephrine @ 9:10 AM  1 gtt Phen 10% prior to laser OD @9 :45        Slit Lamp and Fundus Exam    Slit Lamp Exam      Right Left   Lids/Lashes Dermarto, mild MGD Dermarto, mild MGD   Conjunctiva/Sclera Nasal pinguecula, mild melanosis melanosis   Cornea 1+ inferior PEE 1+ inferior PEE   Anterior Chamber Deep and quiet Deep and quiet   Iris Round and dilated Round and dilated   Lens 2-3+ NS, 2-3+ cortical 2-3+ NS, 2-3+ cortical   Vitreous Synerisis Synerisis       Fundus Exam      Right Left   Disc Pink, sharp Pink, sharp, compact   C/D Ratio 0.2 0.2   Macula Blunted foveal reflex, mild rpe mottling, scattered cystic changes, scattered IRH Blunted foveal reflex, mild rpe mottling, scattered cystic changes, scattered MA   Vessels Mild attenuation and tortuoisty Attenuated, tortuous, mild copper wiring   Periphery Attached; Focal pigmented lattice at 12 and in IT quad; greatest @ 0730. minimal MA Focal pigmented lattice @ 6--2 small patches.  Attached-minimal MA        Refraction    Wearing Rx      Sphere Cylinder Axis Add   Right [place] +0.50 045 +2.00   Left +0.05 +0.50 128 +2.00   Type: PAL          IMAGING AND PROCEDURES  Imaging and Procedures for 05/20/2020  OCT, Retina - OU - Both Eyes       Right Eye Quality was good. Central Foveal Thickness: 279. Progression has been stable. Findings include abnormal foveal  contour, intraretinal fluid, no SRF (Focal IRF centrally and IN macula).   Left Eye Quality was good. Central Foveal Thickness: 264. Progression has been stable. Findings include normal foveal contour, intraretinal fluid, no SRF, vitreomacular adhesion  (Focal cystic changes nasal and superior macula).   Notes *Images captured and stored on drive  Diagnosis / Impression:  Mod NPDR w/ DME OU OD: Focal IRF centrally and IN macula OS: Focal cystic changes nasal and superior macula  Clinical management:  See below  Abbreviations: NFP - Normal foveal profile. CME - cystoid macular edema. PED - pigment epithelial detachment. IRF - intraretinal fluid. SRF - subretinal fluid. EZ - ellipsoid zone. ERM - epiretinal membrane. ORA - outer retinal atrophy. ORT - outer retinal tubulation. SRHM - subretinal hyper-reflective material. IRHM - intraretinal hyper-reflective material        Repair Retinal Breaks, Laser - OD - Right Eye       LASER PROCEDURE NOTE  Procedure:  Barrier laser retinopexy using slit lamp laser, RIGHT eye   Diagnosis:   Lattice degeneration w/ atrophic holes, RIGHT eye                     Patches of lattice: 1200 superiorly; 0600-0800 inferiorly  Surgeon: Bernarda Caffey, MD, PhD  Anesthesia: Topical  Informed consent obtained, operative eye marked,  and time out performed prior to initiation of laser.   Laser settings:  Lumenis [id number] laser, slit lamp Lens: [place] PRP 165 Power: 290 mW Spot size: 200 microns Duration: 30 msec  # spots: 660  Placement of laser: Using a [place]ster PRP 165 contact lens at the slit lamp, laser was placed in three confluent rows around patches of lattice w/ atrophic holes at 12 and from 6-8 oclock, anterior to equator with additional rows anteriorly.  Complications: None.  Patient tolerated the procedure well and received written and verbal post-procedure care information/education.                 ASSESSMENT/PLAN:     ICD-10-CM   1. Moderate nonproliferative diabetic retinopathy of both eyes with macular edema associated with diabetes mellitus due to underlying condition ([place])  Y60.6004   2. Retinal edema  H35.81 OCT, Retina - OU - Both Eyes  3. Lattice degeneration of both retinas  H35.413 Repair Retinal Breaks, Laser - OD - Right Eye  4. Essential hypertension  I10   5. Hypertensive retinopathy of both eyes  H35.033   6. Combined forms of age-related cataract of both eyes  H25.813   7. Dry eyes, bilateral  H04.123   8. Retinal holes, bilateral  H33.323 Repair Retinal Breaks, Laser - OD - Right Eye    1,2. Moderate non-proliferative diabetic retinopathy w/ DME OU  - Last A1c 6.9 on 12.31.2020 - exam shows scattered cystic changes and scattered MA/IRH  OU - FA 9.15.21 shows late leaking MA OU, no NV OU - OCT shows +DME OU: focal IRF centrally and inf nasal macula OD; Focal cystic changes nasal and superior macula OS - BCVA remains very good -- 20/20 OU - Pt wishes to defer tx at this time -- reasonable - monitor as we address lattice degeneration below  3. Lattice degeneration w/ atrophic holes, OU - patches of inferior lattice OU - discussed findings, prognosis, and treatment options including observation - recommend laser retinopexy OD today, 09.29.21 - pt wishes to proceed with laser   - RBA of procedure discussed, questions answered  - informed consent obtained and signed - see procedure note - start PF QID OD x7 days - f/u 1-2 weeks, POV OD, possible laser OS  4,5. Hypertensive retinopathy OU - discussed importance of tight BP control - monitor  6. Mixed Cataract OU - The symptoms of cataract, surgical options, and treatments and risks were discussed with patient. - discussed diagnosis and progression - not yet visually significant - monitor for now  7. Dry eyes OU - recommend artificial tears and lubricating ointment as needed   Ophthalmic Meds Ordered this visit:  Meds ordered  this encounter  Medications  . prednisoLONE acetate (PRED FORTE) 1 % ophthalmic suspension    Sig: Place 1 drop into the right eye 4 (four) times daily for 7 days.    Dispense:  10 mL    Refill:  0       Return for f/u 1-2 weeks, lattice degeneration OU, DFE, OCT.  There are no Patient Instructions on file for this visit.   Explained the diagnoses, plan, and follow up with the patient and they expressed understanding.  Patient expressed understanding of the importance of proper follow up care.   This document serves as a record of services personally performed by Gardiner Sleeper, MD, PhD. It was created on their behalf by Roselee Nova, COMT. The creation of this record is the provider's dictation and/or  activities during the visit.  Electronically signed by: Roselee Nova, COMT 05/20/20 12:27 PM   This document serves as a record of services personally performed by Gardiner Sleeper, MD, PhD. It was created on their behalf by San Jetty. Owens Shark, OA an ophthalmic technician. The creation of this record is the provider's dictation and/or activities during the visit.    Electronically signed by: San Jetty. Owens Shark, [place] 09.29.2021 12:27 PM   Gardiner Sleeper, M.D., Ph.D. Diseases & Surgery of the Retina and Vitreous [facility]  I have reviewed the above documentation for accuracy and completeness, and I agree with the above. Gardiner Sleeper, M.D., Ph.D. 05/20/20 12:27 PM   Abbreviations: M myopia (nearsighted); A astigmatism; H hyperopia (farsighted); P presbyopia; Mrx spectacle prescription;  CTL contact lenses; OD right eye; OS left eye; OU both eyes  XT exotropia; ET esotropia; PEK punctate epithelial keratitis; PEE punctate epithelial erosions; DES dry eye syndrome; MGD meibomian gland dysfunction; ATs artificial tears; PFAT's preservative free artificial tears; [place] nuclear sclerotic cataract; PSC posterior subcapsular cataract; ERM epi-retinal membrane; PVD posterior  vitreous detachment; RD retinal detachment; DM diabetes mellitus; DR diabetic retinopathy; NPDR non-proliferative diabetic retinopathy; PDR proliferative diabetic retinopathy; CSME clinically significant macular edema; DME diabetic macular edema; dbh dot blot hemorrhages; CWS cotton wool spot; POAG primary open angle glaucoma; C/D cup-to-disc ratio; HVF humphrey visual field; GVF goldmann visual field; OCT optical coherence tomography; IOP intraocular pressure; BRVO Branch retinal vein occlusion; CRVO central retinal vein occlusion; CRAO central retinal artery occlusion; BRAO branch retinal artery occlusion; RT retinal tear; SB scleral buckle; PPV pars plana vitrectomy; VH Vitreous hemorrhage; PRP panretinal laser photocoagulation; IVK intravitreal kenalog; VMT vitreomacular traction; MH Macular hole;  NVD neovascularization of the disc; NVE neovascularization elsewhere; AREDS age related eye disease study; ARMD age related macular degeneration; POAG primary open angle glaucoma; EBMD epithelial/anterior basement membrane dystrophy; ACIOL anterior chamber intraocular lens; IOL intraocular lens; PCIOL posterior chamber intraocular lens; Phaco/IOL phacoemulsification with intraocular lens placement; [place] photorefractive keratectomy; LASIK laser assisted in situ keratomileusis; HTN hypertension; DM diabetes mellitus; COPD chronic obstructive pulmonary disease

## 2020-05-20 ENCOUNTER — Ambulatory Visit (INDEPENDENT_AMBULATORY_CARE_PROVIDER_SITE_OTHER): Payer: Medicare Other | Admitting: Ophthalmology

## 2020-05-20 ENCOUNTER — Encounter (INDEPENDENT_AMBULATORY_CARE_PROVIDER_SITE_OTHER): Payer: Self-pay | Admitting: Ophthalmology

## 2020-05-20 DIAGNOSIS — E083313 Diabetes mellitus due to underlying condition with moderate nonproliferative diabetic retinopathy with macular edema, bilateral: Secondary | ICD-10-CM

## 2020-05-20 DIAGNOSIS — H25813 Combined forms of age-related cataract, bilateral: Secondary | ICD-10-CM

## 2020-05-20 DIAGNOSIS — I10 Essential (primary) hypertension: Secondary | ICD-10-CM

## 2020-05-20 DIAGNOSIS — H04123 Dry eye syndrome of bilateral lacrimal glands: Secondary | ICD-10-CM

## 2020-05-20 DIAGNOSIS — H3581 Retinal edema: Secondary | ICD-10-CM

## 2020-05-20 DIAGNOSIS — H35413 Lattice degeneration of retina, bilateral: Secondary | ICD-10-CM | POA: Diagnosis not present

## 2020-05-20 DIAGNOSIS — H33323 Round hole, bilateral: Secondary | ICD-10-CM

## 2020-05-20 DIAGNOSIS — H35033 Hypertensive retinopathy, bilateral: Secondary | ICD-10-CM

## 2020-05-20 MED ORDER — PREDNISOLONE ACETATE 1 % OP SUSP: 1.0000 [drp] | Freq: Four times a day (QID) | OPHTHALMIC | 0 refills | Status: AC

## 2020-05-21 DIAGNOSIS — I10 Essential (primary) hypertension: Secondary | ICD-10-CM | POA: Diagnosis not present

## 2020-05-21 DIAGNOSIS — J449 Chronic obstructive pulmonary disease, unspecified: Secondary | ICD-10-CM | POA: Diagnosis not present

## 2020-05-21 DIAGNOSIS — E119 Type 2 diabetes mellitus without complications: Secondary | ICD-10-CM | POA: Diagnosis not present

## 2020-05-22 DIAGNOSIS — I517 Cardiomegaly: Secondary | ICD-10-CM | POA: Diagnosis not present

## 2020-05-22 DIAGNOSIS — I313 Pericardial effusion (noninflammatory): Secondary | ICD-10-CM | POA: Diagnosis not present

## 2020-05-25 ENCOUNTER — Ambulatory Visit (HOSPITAL_COMMUNITY): Admission: RE | Admit: 2020-05-25 | Payer: Medicare Other | Source: Ambulatory Visit

## 2020-05-26 DIAGNOSIS — N185 Chronic kidney disease, stage 5: Secondary | ICD-10-CM | POA: Diagnosis not present

## 2020-05-26 DIAGNOSIS — I10 Essential (primary) hypertension: Secondary | ICD-10-CM | POA: Diagnosis not present

## 2020-05-26 DIAGNOSIS — I313 Pericardial effusion (noninflammatory): Secondary | ICD-10-CM | POA: Diagnosis not present

## 2020-06-01 ENCOUNTER — Encounter: Payer: Self-pay | Admitting: Vascular Surgery

## 2020-06-01 ENCOUNTER — Ambulatory Visit: Payer: Medicare Other | Admitting: Vascular Surgery

## 2020-06-01 ENCOUNTER — Other Ambulatory Visit: Payer: Self-pay

## 2020-06-01 VITALS — BP 160/84 | HR 83 | Temp 98.3°F | Resp 16 | Ht 67.0 in | Wt 221.0 lb

## 2020-06-01 DIAGNOSIS — N184 Chronic kidney disease, stage 4 (severe): Secondary | ICD-10-CM | POA: Diagnosis not present

## 2020-06-01 NOTE — Progress Notes (Signed)
[facility]  Patient name: Cindy Park MRN: [id number] DOB: Jul 07, 1956 Sex: female  REASON FOR CONSULT: Discuss access for hemodialysis  HPI: Cindy Park is a 64 y.o. female, who is here today for discussion of access for hemodialysis.  She is right-handed.  She is here today with her husband.  She has had progressive renal insufficiency and is now approaching need for hemodialysis.  We are seeing her in consultation for permanent access.  She does not have a pacemaker and she has never had central lines.  She does have diabetic retinopathy and is planned to have retinal surgery in 2 days.  Past Medical History:  Diagnosis Date   Anemia    Arthritis    Asthma    Cataract    Combined form OU   COPD (chronic obstructive pulmonary disease) ([place])    Diabetes mellitus without complication ([facility])    Diabetic retinopathy ([place])    BDR OU   Fluid excess    GERD (gastroesophageal reflux disease)    Goiter    Hypertension    Hypertensive retinopathy    OU   Renal failure     Family History  Problem Relation Age of Onset   Kidney disease Mother        ESRD   Kidney disease Sister        ESRD   Colon polyps Brother 24   Kidney disease Maternal Grandmother        ESRD   Colon cancer Neg Hx     SOCIAL HISTORY: Social History   Socioeconomic History   Marital status: Married    Spouse name: Not on file   Number of children: Not on file   Years of education: Not on file   Highest education level: Not on file  Occupational History   Not on file  Tobacco Use   Smoking status: Never Smoker   Smokeless tobacco: Never Used  Vaping Use   Vaping Use: Never used  Substance and Sexual Activity   Alcohol use: No   Drug use: No   Sexual activity: Not on file  Other Topics Concern   Not on file  Social History Narrative   Not on file   Social Determinants of Health    Financial Resource Strain:    Difficulty of Paying Living Expenses: Not on file  Food Insecurity:    Worried About Running Out of Food in the Last Year: Not on file   Ran Out of Food in the Last Year: Not on file  Transportation Needs:    Lack of Transportation (Medical): Not on file   Lack of Transportation (Non-Medical): Not on file  Physical Activity:    Days of Exercise per Week: Not on file   Minutes of Exercise per Session: Not on file  Stress:    Feeling of Stress : Not on file  Social Connections:    Frequency of Communication with Friends and Family: Not on file   Frequency of Social Gatherings with Friends and Family: Not on file   Attends Religious Services: Not on file   Active Member of Clubs or Organizations: Not on file   Attends Archivist Meetings: Not on file   Marital Status: Not on file  Intimate Partner Violence:    Fear of Current or Ex-Partner: Not on file   Emotionally Abused: Not on file   Physically Abused: Not on file   Sexually Abused: Not on file  Allergies  Allergen Reactions   Niacin Other (See Comments)    Burning   Other Dermatitis    Powder in the gloves    Tomato Other (See Comments)    Breaks pt out    Bactrim [Sulfamethoxazole-Trimethoprim] Swelling and Rash    Current Outpatient Medications  Medication Sig Dispense Refill   acetaminophen (TYLENOL) 500 MG tablet Take by mouth.     albuterol (PROVENTIL) (2.5 MG/3ML) 0.083% nebulizer solution Take 2.5 mg by nebulization 4 (four) times daily.     albuterol (VENTOLIN HFA) 108 (90 Base) MCG/ACT inhaler Inhale 2 puffs into the lungs every 6 (six) hours as needed for wheezing or shortness of breath.     amLODipine (NORVASC) 5 MG tablet Take 5 mg by mouth 2 (two) times daily.     aspirin EC 81 MG tablet Take 81 mg by mouth daily.     atorvastatin (LIPITOR) 40 MG tablet Take 40 mg by mouth at bedtime.     budesonide (PULMICORT) 1 MG/2ML nebulizer  solution      calcitRIOL (ROCALTROL) 0.25 MCG capsule Take 0.25 mcg by mouth daily.     carvedilol (COREG) 12.5 MG tablet Take by mouth.     cholecalciferol (VITAMIN D3) 25 MCG (1000 UNIT) tablet Take 2,000 Units by mouth in the morning and at bedtime.     clonazePAM (KLONOPIN) 0.5 MG tablet Take 0.5 mg by mouth daily.     dicyclomine (BENTYL) 10 MG capsule Take 10 mg by mouth 2 (two) times daily.      epoetin alfa (EPOGEN) 3000 UNIT/ML injection Inject 3,000 Units into the vein every 14 (fourteen) days.     ferrous sulfate 325 (65 FE) MG tablet Take 325 mg by mouth 2 (two) times daily with a meal.     Fluticasone-Umeclidin-Vilant (TRELEGY ELLIPTA) 100-62.5-25 MCG/INH AEPB Inhale 1 puff into the lungs daily.     furosemide (LASIX) 40 MG tablet Take 80 mg by mouth 2 (two) times daily.      gabapentin (NEURONTIN) 300 MG capsule Take 300 mg by mouth 2 (two) times daily.      gemfibrozil (LOPID) 600 MG tablet Take 600 mg by mouth 2 (two) times daily before a meal.     hydrALAZINE (APRESOLINE) 50 MG tablet Take 50 mg by mouth 2 (two) times daily.     Insulin Lispro Prot & Lispro (HUMALOG MIX 75/25 [place]) Inject 15-60 Units into the skin 3 (three) times daily. 60 unit am 15 units lunch 60 units supper     insulin NPH-regular Human (70-30) 100 UNIT/ML injection Inject into the skin.     levothyroxine (SYNTHROID) 50 MCG tablet Take 50 mcg by mouth daily.     montelukast (SINGULAIR) 10 MG tablet Take 10 mg by mouth at bedtime.     Omeprazole 20 MG TBEC Take 20 mg by mouth daily.      sitaGLIPtin (JANUVIA) 25 MG tablet Take 25 mg by mouth daily.     sodium bicarbonate 650 MG tablet Take 650 mg by mouth 2 (two) times daily.     vitamin B-12 (CYANOCOBALAMIN) 1000 MCG tablet Take 1,000 mcg by mouth daily.      No current facility-administered medications for this visit.    REVIEW OF SYSTEMS:  [X]  denotes positive finding, [ ]  denotes negative finding Cardiac  Comments:  Chest pain  or chest pressure:    Shortness of breath upon exertion:    Short of breath when lying flat:    Irregular  heart rhythm:        Vascular    Pain in calf, thigh, or hip brought on by ambulation:    Pain in feet at night that wakes you up from your sleep:     Blood clot in your veins:    Leg swelling:  x       Pulmonary    Oxygen at home:    Productive cough:     Wheezing:         Neurologic    Sudden weakness in arms or legs:     Sudden numbness in arms or legs:     Sudden onset of difficulty speaking or slurred speech:    Temporary loss of vision in one eye:     Problems with dizziness:         Gastrointestinal    Blood in stool:     Vomited blood:         Genitourinary    Burning when urinating:     Blood in urine:        Psychiatric    Major depression:         Hematologic    Bleeding problems:    Problems with blood clotting too easily:        Skin    Rashes or ulcers:        Constitutional    Fever or chills:      PHYSICAL EXAM: Vitals:   06/01/20 1341  BP: (!) 160/84  Pulse: 83  Resp: 16  Temp: 98.3 F (36.8 C)  TempSrc: Other (Comment)  SpO2: 96%  Weight: 221 lb (100.2 kg)  Height: 5\' 7"  (1.702 m)    GENERAL: The patient is a well-nourished female, in no acute distress. The vital signs are documented above. CARDIOVASCULAR: 2+ radial pulses bilaterally.  Very small surface veins bilaterally PULMONARY: There is good air exchange  ABDOMEN: Soft and non-tender  MUSCULOSKELETAL: There are no major deformities or cyanosis. NEUROLOGIC: No focal weakness or paresthesias are detected. SKIN: There are no ulcers or rashes noted. PSYCHIATRIC: The patient has a normal affect.  DATA:  I imaged her surface veins with SonoSite bilaterally.  This revealed very small cephalic vein on the left arm.  She did have a moderate size left basilic vein.  Right arm cephalic vein was several millimeters at the antecubital space but much smaller proximally.  MEDICAL  ISSUES: Had a long discussion with the patient and her husband present.  Discussed options for hemodialysis to include tunneled catheter for acute hemodialysis.  Also discussed AV graft and AV fistula.  Appears as though she does have adequate left basilic vein for a fistula attempt.  I discussed the potential for nonmaturation.  Feel that this would be her best first option and we will schedule this in 1 week at [facility] on 06/11/2020 as an outpatient   [name], MD [facility] Tel [phone] Pager [phone]

## 2020-06-01 NOTE — H&P (View-Only) (Signed)
[facility] [place]  Patient name: Cindy Park MRN: [id number] DOB: Feb 22, 1956 Sex: female  REASON FOR CONSULT: Discuss access for hemodialysis  HPI: Cindy Park is a 64 y.o. female, who is here today for discussion of access for hemodialysis.  She is right-handed.  She is here today with her husband.  She has had progressive renal insufficiency and is now approaching need for hemodialysis.  We are seeing her in consultation for permanent access.  She does not have a pacemaker and she has never had central lines.  She does have diabetic retinopathy and is planned to have retinal surgery in 2 days.  Past Medical History:  Diagnosis Date  . Anemia   . Arthritis   . Asthma   . Cataract    Combined form OU  . COPD (chronic obstructive pulmonary disease) ([place])   . Diabetes mellitus without complication ([place])   . Diabetic retinopathy ([place])    BDR OU  . Fluid excess   . GERD (gastroesophageal reflux disease)   . Goiter   . Hypertension   . Hypertensive retinopathy    OU  . Renal failure     Family History  Problem Relation Age of Onset  . Kidney disease Mother        ESRD  . Kidney disease Sister        ESRD  . Colon polyps Brother 87  . Kidney disease Maternal Grandmother        ESRD  . Colon cancer Neg Hx     SOCIAL HISTORY: Social History   Socioeconomic History  . Marital status: Married    Spouse name: Not on file  . Number of children: Not on file  . Years of education: Not on file  . Highest education level: Not on file  Occupational History  . Not on file  Tobacco Use  . Smoking status: Never Smoker  . Smokeless tobacco: Never Used  Vaping Use  . Vaping Use: Never used  Substance and Sexual Activity  . Alcohol use: No  . Drug use: No  . Sexual activity: Not on file  Other Topics Concern  . Not on file  Social History Narrative  . Not on file   Social Determinants of Health    Financial Resource Strain:   . Difficulty of Paying Living Expenses: Not on file  Food Insecurity:   . Worried About Charity fundraiser in the Last Year: Not on file  . Ran Out of Food in the Last Year: Not on file  Transportation Needs:   . Lack of Transportation (Medical): Not on file  . Lack of Transportation (Non-Medical): Not on file  Physical Activity:   . Days of Exercise per Week: Not on file  . Minutes of Exercise per Session: Not on file  Stress:   . Feeling of Stress : Not on file  Social Connections:   . Frequency of Communication with Friends and Family: Not on file  . Frequency of Social Gatherings with Friends and Family: Not on file  . Attends Religious Services: Not on file  . Active Member of Clubs or Organizations: Not on file  . Attends Archivist Meetings: Not on file  . Marital Status: Not on file  Intimate Partner Violence:   . Fear of Current or Ex-Partner: Not on file  . Emotionally Abused: Not on file  . Physically Abused: Not on file  . Sexually Abused: Not on file  Allergies  Allergen Reactions  . Niacin Other (See Comments)    Burning  . Other Dermatitis    Powder in the gloves   . Tomato Other (See Comments)    Breaks pt out   . Bactrim [Sulfamethoxazole-Trimethoprim] Swelling and Rash    Current Outpatient Medications  Medication Sig Dispense Refill  . acetaminophen (TYLENOL) 500 MG tablet Take by mouth.    Marland Kitchen albuterol (PROVENTIL) (2.5 MG/3ML) 0.083% nebulizer solution Take 2.5 mg by nebulization 4 (four) times daily.    Marland Kitchen albuterol (VENTOLIN HFA) 108 (90 Base) MCG/ACT inhaler Inhale 2 puffs into the lungs every 6 (six) hours as needed for wheezing or shortness of breath.    Marland Kitchen amLODipine (NORVASC) 5 MG tablet Take 5 mg by mouth 2 (two) times daily.    Marland Kitchen aspirin EC 81 MG tablet Take 81 mg by mouth daily.    Marland Kitchen atorvastatin (LIPITOR) 40 MG tablet Take 40 mg by mouth at bedtime.    . budesonide (PULMICORT) 1 MG/2ML nebulizer  solution     . calcitRIOL (ROCALTROL) 0.25 MCG capsule Take 0.25 mcg by mouth daily.    . carvedilol (COREG) 12.5 MG tablet Take by mouth.    . cholecalciferol (VITAMIN D3) 25 MCG (1000 UNIT) tablet Take 2,000 Units by mouth in the morning and at bedtime.    . clonazePAM (KLONOPIN) 0.5 MG tablet Take 0.5 mg by mouth daily.    Marland Kitchen dicyclomine (BENTYL) 10 MG capsule Take 10 mg by mouth 2 (two) times daily.     Marland Kitchen epoetin alfa (EPOGEN) 3000 UNIT/ML injection Inject 3,000 Units into the vein every 14 (fourteen) days.    . ferrous sulfate 325 (65 FE) MG tablet Take 325 mg by mouth 2 (two) times daily with a meal.    . Fluticasone-Umeclidin-Vilant (TRELEGY ELLIPTA) 100-62.5-25 MCG/INH AEPB Inhale 1 puff into the lungs daily.    . furosemide (LASIX) 40 MG tablet Take 80 mg by mouth 2 (two) times daily.     Marland Kitchen gabapentin (NEURONTIN) 300 MG capsule Take 300 mg by mouth 2 (two) times daily.     Marland Kitchen gemfibrozil (LOPID) 600 MG tablet Take 600 mg by mouth 2 (two) times daily before a meal.    . hydrALAZINE (APRESOLINE) 50 MG tablet Take 50 mg by mouth 2 (two) times daily.    . Insulin Lispro Prot & Lispro (HUMALOG MIX 75/25 [place]) Inject 15-60 Units into the skin 3 (three) times daily. 60 unit am 15 units lunch 60 units supper    . insulin NPH-regular Human (70-30) 100 UNIT/ML injection Inject into the skin.    Marland Kitchen levothyroxine (SYNTHROID) 50 MCG tablet Take 50 mcg by mouth daily.    . montelukast (SINGULAIR) 10 MG tablet Take 10 mg by mouth at bedtime.    . Omeprazole 20 MG TBEC Take 20 mg by mouth daily.     . sitaGLIPtin (JANUVIA) 25 MG tablet Take 25 mg by mouth daily.    . sodium bicarbonate 650 MG tablet Take 650 mg by mouth 2 (two) times daily.    . vitamin B-12 (CYANOCOBALAMIN) 1000 MCG tablet Take 1,000 mcg by mouth daily.      No current facility-administered medications for this visit.    REVIEW OF SYSTEMS:  [X]  denotes positive finding, [ ]  denotes negative finding Cardiac  Comments:  Chest pain  or chest pressure:    Shortness of breath upon exertion:    Short of breath when lying flat:    Irregular  heart rhythm:        Vascular    Pain in calf, thigh, or hip brought on by ambulation:    Pain in feet at night that wakes you up from your sleep:     Blood clot in your veins:    Leg swelling:  x       Pulmonary    Oxygen at home:    Productive cough:     Wheezing:         Neurologic    Sudden weakness in arms or legs:     Sudden numbness in arms or legs:     Sudden onset of difficulty speaking or slurred speech:    Temporary loss of vision in one eye:     Problems with dizziness:         Gastrointestinal    Blood in stool:     Vomited blood:         Genitourinary    Burning when urinating:     Blood in urine:        Psychiatric    Major depression:         Hematologic    Bleeding problems:    Problems with blood clotting too easily:        Skin    Rashes or ulcers:        Constitutional    Fever or chills:      PHYSICAL EXAM: Vitals:   06/01/20 1341  BP: (!) 160/84  Pulse: 83  Resp: 16  Temp: 98.3 F (36.8 C)  TempSrc: Other (Comment)  SpO2: 96%  Weight: 221 lb (100.2 kg)  Height: 5\' 7"  (1.702 m)    GENERAL: The patient is a well-nourished female, in no acute distress. The vital signs are documented above. CARDIOVASCULAR: 2+ radial pulses bilaterally.  Very small surface veins bilaterally PULMONARY: There is good air exchange  ABDOMEN: Soft and non-tender  MUSCULOSKELETAL: There are no major deformities or cyanosis. NEUROLOGIC: No focal weakness or paresthesias are detected. SKIN: There are no ulcers or rashes noted. PSYCHIATRIC: The patient has a normal affect.  DATA:  I imaged her surface veins with SonoSite bilaterally.  This revealed very small cephalic vein on the left arm.  She did have a moderate size left basilic vein.  Right arm cephalic vein was several millimeters at the antecubital space but much smaller proximally.  MEDICAL  ISSUES: Had a long discussion with the patient and her husband present.  Discussed options for hemodialysis to include tunneled catheter for acute hemodialysis.  Also discussed AV graft and AV fistula.  Appears as though she does have adequate left basilic vein for a fistula attempt.  I discussed the potential for nonmaturation.  Feel that this would be her best first option and we will schedule this in 1 week at [facility] on 06/11/2020 as an outpatient   [name], MD [facility] Tel [phone] Pager [phone]

## 2020-06-02 ENCOUNTER — Other Ambulatory Visit: Payer: Self-pay

## 2020-06-02 ENCOUNTER — Encounter (HOSPITAL_COMMUNITY)
Admission: RE | Admit: 2020-06-02 | Discharge: 2020-06-02 | Disposition: A | Discharge status: Home / Self Care | Payer: Medicare Other | Source: Ambulatory Visit | Attending: Nephrology | Admitting: Nephrology

## 2020-06-02 ENCOUNTER — Encounter (HOSPITAL_COMMUNITY): Payer: Self-pay

## 2020-06-02 DIAGNOSIS — D631 Anemia in chronic kidney disease: Secondary | ICD-10-CM | POA: Diagnosis not present

## 2020-06-02 DIAGNOSIS — N184 Chronic kidney disease, stage 4 (severe): Secondary | ICD-10-CM | POA: Insufficient documentation

## 2020-06-02 LAB — COMPREHENSIVE METABOLIC PANEL
ALT: 15 U/L (ref 0–44)
AST: 21 U/L (ref 15–41)
Albumin: 3.2 g/dL — ABNORMAL LOW (ref 3.5–5.0)
Alkaline Phosphatase: 70 U/L (ref 38–126)
Anion gap: 12 (ref 5–15)
BUN: 61 mg/dL — ABNORMAL HIGH (ref 8–23)
CO2: 21 mmol/L — ABNORMAL LOW (ref 22–32)
Calcium: 8.8 mg/dL — ABNORMAL LOW (ref 8.9–10.3)
Chloride: 106 mmol/L (ref 98–111)
Creatinine, Ser: 4.39 mg/dL — ABNORMAL HIGH (ref 0.44–1.00)
GFR, Estimated: 10 mL/min — ABNORMAL LOW (ref >60)
Glucose, Bld: 104 mg/dL — ABNORMAL HIGH (ref 70–99)
Potassium: 3.7 mmol/L (ref 3.5–5.1)
Sodium: 139 mmol/L (ref 135–145)
Total Bilirubin: 0.4 mg/dL (ref 0.3–1.2)
Total Protein: 6.7 g/dL (ref 6.5–8.1)

## 2020-06-02 LAB — LIPID PANEL
Cholesterol: 198 mg/dL (ref 0–200)
HDL: 56 mg/dL (ref >40)
LDL Cholesterol: 130 mg/dL — ABNORMAL HIGH (ref 0–99)
Total CHOL/HDL Ratio: 3.5 RATIO
Triglycerides: 59 mg/dL (ref <150)
VLDL: 12 mg/dL (ref 0–40)

## 2020-06-02 LAB — CBC
HCT: 29.4 % — ABNORMAL LOW (ref 36.0–46.0)
Hemoglobin: 9.5 g/dL — ABNORMAL LOW (ref 12.0–15.0)
MCH: 27.3 pg (ref 26.0–34.0)
MCHC: 32.3 g/dL (ref 30.0–36.0)
MCV: 84.5 fL (ref 80.0–100.0)
Platelets: 287 10*3/uL (ref 150–400)
RBC: 3.48 MIL/uL — ABNORMAL LOW (ref 3.87–5.11)
RDW: 13.7 % (ref 11.5–15.5)
WBC: 7.9 10*3/uL (ref 4.0–10.5)
nRBC: 0 % (ref 0.0–0.2)

## 2020-06-02 LAB — TSH: TSH: 2.614 u[IU]/mL (ref 0.350–4.500)

## 2020-06-02 LAB — POCT HEMOGLOBIN-HEMACUE: Hemoglobin: 9.5 g/dL — ABNORMAL LOW (ref 12.0–15.0)

## 2020-06-02 MED ORDER — EPOETIN ALFA 3000 UNIT/ML IJ SOLN
3000.0000 [IU] | Freq: Once | INTRAMUSCULAR | Status: AC
  Administered 2020-06-02: 3000 [IU] via SUBCUTANEOUS

## 2020-06-02 MED ORDER — EPOETIN ALFA 3000 UNIT/ML IJ SOLN
INTRAMUSCULAR | Status: AC
  Filled 2020-06-02: qty 1

## 2020-06-02 NOTE — Progress Notes (Signed)
[facility] Note  06/03/2020     CHIEF COMPLAINT Patient presents for Retina Follow Up   HISTORY OF PRESENT ILLNESS: RENELLA Park is a 64 y.o. female who presents to the clinic today for:   HPI    Retina Follow Up    Patient presents with  Diabetic Retinopathy.  In both eyes.  This started weeks ago.  Severity is moderate.  Duration of weeks.  Since onset it is gradually improving.  I, the attending physician,  performed the HPI with the patient and updated documentation appropriately.          Comments    Pt states her vision is getting better.  Patient denies eye pain or discomfort and denies any new or worsening floaters or fol OU.       Last edited by Cindy Caffey, MD on 06/03/2020 11:09 AM. (History)    Pt here for laser retinopexy OS  Referring physician: Leticia Park OD [address], [place] [id number]  HISTORICAL INFORMATION:   Selected notes from the MEDICAL RECORD NUMBER Referred by Dr. Leticia Park for DM eval LEE: 08.19.2021 BCVA OD: 20/20 OS: 20/20 Ocular Hx- cataract, CWS, macular edema PMH- DM, HTN, stage 3 kidney failure, CHF    CURRENT MEDICATIONS: No current outpatient medications on file. (Ophthalmic Drugs)   No current facility-administered medications for this visit. (Ophthalmic Drugs)   Current Outpatient Medications (Other)  Medication Sig  . acetaminophen (TYLENOL) 500 MG tablet Take by mouth.  Marland Kitchen albuterol (PROVENTIL) (2.5 MG/3ML) 0.083% nebulizer solution Take 2.5 mg by nebulization 4 (four) times daily.  Marland Kitchen albuterol (VENTOLIN HFA) 108 (90 Base) MCG/ACT inhaler Inhale 2 puffs into the lungs every 6 (six) hours as needed for wheezing or shortness of breath.  Marland Kitchen amLODipine (NORVASC) 5 MG tablet Take 5 mg by mouth 2 (two) times daily.  Marland Kitchen aspirin EC 81 MG tablet Take 81 mg by mouth daily.  Marland Kitchen atorvastatin (LIPITOR) 40 MG tablet Take 40 mg by mouth at bedtime.  . budesonide (PULMICORT) 1 MG/2ML  nebulizer solution   . calcitRIOL (ROCALTROL) 0.25 MCG capsule Take 0.25 mcg by mouth daily.  . carvedilol (COREG) 12.5 MG tablet Take by mouth.  . cholecalciferol (VITAMIN D3) 25 MCG (1000 UNIT) tablet Take 2,000 Units by mouth in the morning and at bedtime.  . clonazePAM (KLONOPIN) 0.5 MG tablet Take 0.5 mg by mouth daily.  Marland Kitchen dicyclomine (BENTYL) 10 MG capsule Take 10 mg by mouth 2 (two) times daily.   Marland Kitchen epoetin alfa (EPOGEN) 3000 UNIT/ML injection Inject 3,000 Units into the vein every 14 (fourteen) days.  . ferrous sulfate 325 (65 FE) MG tablet Take 325 mg by mouth 2 (two) times daily with a meal.  . Fluticasone-Umeclidin-Vilant (TRELEGY ELLIPTA) 100-62.5-25 MCG/INH AEPB Inhale 1 puff into the lungs daily.  . furosemide (LASIX) 40 MG tablet Take 80 mg by mouth 2 (two) times daily.   Marland Kitchen gabapentin (NEURONTIN) 300 MG capsule Take 300 mg by mouth 2 (two) times daily.   Marland Kitchen gemfibrozil (LOPID) 600 MG tablet Take 600 mg by mouth 2 (two) times daily before a meal.  . hydrALAZINE (APRESOLINE) 50 MG tablet Take 50 mg by mouth 2 (two) times daily.  . Insulin Lispro Prot & Lispro (HUMALOG MIX 75/25 [place]) Inject 15-60 Units into the skin 3 (three) times daily. 60 unit am 15 units lunch 60 units supper  . insulin NPH-regular Human (70-30) 100 UNIT/ML injection  Inject into the skin.  Marland Kitchen levothyroxine (SYNTHROID) 50 MCG tablet Take 50 mcg by mouth daily.  . montelukast (SINGULAIR) 10 MG tablet Take 10 mg by mouth at bedtime.  . Omeprazole 20 MG TBEC Take 20 mg by mouth daily.   . sitaGLIPtin (JANUVIA) 25 MG tablet Take 25 mg by mouth daily.  . sodium bicarbonate 650 MG tablet Take 650 mg by mouth 2 (two) times daily.  . vitamin B-12 (CYANOCOBALAMIN) 1000 MCG tablet Take 1,000 mcg by mouth daily.    No current facility-administered medications for this visit. (Other)      REVIEW OF SYSTEMS: ROS    Positive for: Gastrointestinal, Endocrine, Eyes, Respiratory   Negative for: Constitutional,  Neurological, Skin, Genitourinary, Musculoskeletal, HENT, Cardiovascular, Psychiatric, Allergic/Imm, Heme/Lymph   Last edited by Cindy Park on 06/03/2020  9:30 AM. (History)       ALLERGIES Allergies  Allergen Reactions  . Niacin Other (See Comments)    Burning  . Other Dermatitis    Powder in the gloves   . Tomato Other (See Comments)    Breaks pt out   . Bactrim [Sulfamethoxazole-Trimethoprim] Swelling and Rash    PAST MEDICAL HISTORY Past Medical History:  Diagnosis Date  . Anemia   . Arthritis   . Asthma   . Cataract    Combined form OU  . COPD (chronic obstructive pulmonary disease) ([place])   . Diabetes mellitus without complication ([place])   . Diabetic retinopathy ([place])    BDR OU  . Fluid excess   . GERD (gastroesophageal reflux disease)   . Goiter   . Hypertension   . Hypertensive retinopathy    OU  . Renal failure    Past Surgical History:  Procedure Laterality Date  . ABDOMINAL HYSTERECTOMY    . CHOLECYSTECTOMY    . COLONOSCOPY N/A 03/25/2020   Procedure: COLONOSCOPY;  Surgeon: Cindy Dolin, MD;  Location: [facility];  Service: Endoscopy;  Laterality: N/A;  10:15am  . POLYPECTOMY  03/25/2020   Procedure: POLYPECTOMY;  Surgeon: Cindy Dolin, MD;  Location: [facility];  Service: Endoscopy;;    FAMILY HISTORY Family History  Problem Relation Age of Onset  . Kidney disease Mother        ESRD  . Kidney disease Sister        ESRD  . Colon polyps Brother 32  . Kidney disease Maternal Grandmother        ESRD  . Colon cancer Neg Hx     SOCIAL HISTORY Social History   Tobacco Use  . Smoking status: Never Smoker  . Smokeless tobacco: Never Used  Vaping Use  . Vaping Use: Never used  Substance Use Topics  . Alcohol use: No  . Drug use: No         OPHTHALMIC EXAM:  Base Eye Exam    Visual Acuity (Snellen - Linear)      Right Left   Dist cc 20/25 -2 20/20   Dist ph cc 20/20 -2        Tonometry (Tonopen, 9:36 AM)      Right  Left   Pressure 14 17       Pupils      Dark Light Shape React APD   Right 3 2 Round Brisk 0   Left 3 2 Round Brisk 0       Visual Fields      Left Right    Full Full  Extraocular Movement      Right Left    Full Full       Neuro/Psych    Oriented x3: Yes   Mood/Affect: Normal       Dilation    Both eyes: 1.0% Mydriacyl, 2.5% Phenylephrine @ 9:36 AM        Slit Lamp and Fundus Exam    Slit Lamp Exam      Right Left   Lids/Lashes Dermarto, mild MGD Dermarto, mild MGD   Conjunctiva/Sclera Nasal pinguecula, mild melanosis melanosis   Cornea 1+ inferior PEE 1+ inferior PEE   Anterior Chamber Deep and quiet Deep and quiet   Iris Round and dilated Round and dilated   Lens 2-3+ NS, 2-3+ cortical 2-3+ NS, 2-3+ cortical   Vitreous Synerisis Synerisis       Fundus Exam      Right Left   Disc Pink, sharp Pink, sharp, compact   C/D Ratio 0.2 0.2   Macula Blunted foveal reflex, mild rpe mottling, scattered cystic changes, scattered IRH Blunted foveal reflex, mild rpe mottling, scattered cystic changes, scattered MA   Vessels Mild attenuation and tortuoisty Attenuated, tortuous, mild copper wiring   Periphery Attached; Focal pigmented lattice at 12 and in IT quad; greatest @ 0730. minimal MA Focal pigmented lattice from 0530-0700.  Attached-minimal MA        Refraction    Wearing Rx      Sphere Cylinder Axis Add   Right [place] +0.50 045 +2.00   Left +0.05 +0.50 128 +2.00   Type: PAL          IMAGING AND PROCEDURES  Imaging and Procedures for 06/03/2020  OCT, Retina - OU - Both Eyes       Right Eye Quality was good. Central Foveal Thickness: 294. Progression has been stable. Findings include abnormal foveal contour, intraretinal fluid, no SRF (Focal IRF centrally and IN macula).   Left Eye Quality was good. Central Foveal Thickness: 264. Progression has been stable. Findings include normal foveal contour, intraretinal fluid, no SRF, vitreomacular  adhesion  (Focal cystic changes nasal and superior macula).   Notes *Images captured and stored on drive  Diagnosis / Impression:  Mod NPDR w/ DME OU OD: Focal IRF centrally and IN macula OS: Focal cystic changes nasal and superior macula  Clinical management:  See below  Abbreviations: NFP - Normal foveal profile. CME - cystoid macular edema. PED - pigment epithelial detachment. IRF - intraretinal fluid. SRF - subretinal fluid. EZ - ellipsoid zone. ERM - epiretinal membrane. ORA - outer retinal atrophy. ORT - outer retinal tubulation. SRHM - subretinal hyper-reflective material. IRHM - intraretinal hyper-reflective material        Repair Retinal Breaks, Laser - OS - Left Eye       LASER PROCEDURE NOTE  Procedure:  Barrier laser retinopexy using slit lamp laser, LEFT eye   Diagnosis:   Lattice degeneration w/ atrophic holes, LEFT eye                     Patches of lattice: 0530-0700 inferiorly  Surgeon: Cindy Caffey, MD, PhD  Anesthesia: Topical  Informed consent obtained, operative eye marked, and time out performed prior to initiation of laser.   Laser settings:  Lumenis [id number] laser, slit lamp Lens: [place] PRP 165 Power: 270 mW Spot size: 200 microns Duration: 30 msec  # spots: 385  Placement of laser: Using a [place] PRP 165 contact lens at the slit lamp, laser was  placed in three confluent rows around patches of lattice w/ atrophic holes from 0530 to 0700 oclock anterior to equator with additional rows anteriorly.  Complications: None.  Patient tolerated the procedure well and received written and verbal post-procedure care information/education.                 ASSESSMENT/PLAN:    ICD-10-CM   1. Moderate nonproliferative diabetic retinopathy of both eyes with macular edema associated with diabetes mellitus due to underlying condition ([place])  K87.6811   2. Retinal edema  H35.81 OCT, Retina - OU - Both Eyes  3. Lattice degeneration of both  retinas  H35.413 Repair Retinal Breaks, Laser - OS - Left Eye  4. Retinal holes, bilateral  H33.323 Repair Retinal Breaks, Laser - OS - Left Eye  5. Essential hypertension  I10   6. Hypertensive retinopathy of both eyes  H35.033   7. Combined forms of age-related cataract of both eyes  H25.813   8. Dry eyes, bilateral  H04.123     1,2. Moderate non-proliferative diabetic retinopathy w/ DME OU  - Last A1c 6.9 on 12.31.2020 - exam shows scattered cystic changes and scattered MA/IRH  OU - FA 9.15.21 shows late leaking MA OU, no NV OU - OCT shows +DME OU: focal IRF centrally and inf nasal macula OD; Focal cystic changes nasal and superior macula OS - BCVA remains very good -- 20/20 OU - Pt wishes to defer tx at this time -- reasonable - monitor as we address lattice degeneration below  3,4. Lattice degeneration w/ atrophic holes, OU - patches of inferior lattice OU - discussed findings, prognosis, and treatment options including observation - s/p laser retinopexy OD (09.29.21) - recommend laser retinopexy OS today, 10.13.21 - pt wishes to proceed with laser   - RBA of procedure discussed, questions answered  - informed consent obtained and signed - see procedure note - start PF QID OS x7 days             - f/u 3 weeks, POV  5,6. Hypertensive retinopathy OU - discussed importance of tight BP control - monitor  7. Mixed Cataract OU - The symptoms of cataract, surgical options, and treatments and risks were discussed with patient. - discussed diagnosis and progression - not yet visually significant - monitor for now  8. Dry eyes OU - recommend artificial tears and lubricating ointment as needed   Ophthalmic Meds Ordered this visit:  No orders of the defined types were placed in this encounter.      Return in about 3 weeks (around 06/24/2020) for f/u lattice degeneration OU, DFE, OCT.  There are no Patient Instructions on file for this visit.   Explained the diagnoses,  plan, and follow up with the patient and they expressed understanding.  Patient expressed understanding of the importance of proper follow up care.   This document serves as a record of services personally performed by Gardiner Sleeper, MD, PhD. It was created on their behalf by Roselee Nova, COMT. The creation of this record is the provider's dictation and/or activities during the visit.  Electronically signed by: Roselee Nova, COMT 06/03/20 12:12 PM   This document serves as a record of services personally performed by Gardiner Sleeper, MD, PhD. It was created on their behalf by San Jetty. Owens Shark, OA an ophthalmic technician. The creation of this record is the provider's dictation and/or activities during the visit.    Electronically signed by: San Jetty. Owens Shark, OA 10.13.2021 12:12 PM  Gardiner Sleeper, M.D., Ph.D. Diseases & Surgery of the Retina and Vitreous [facility]  I have reviewed the above documentation for accuracy and completeness, and I agree with the above. Gardiner Sleeper, M.D., Ph.D. 06/03/20 12:12 PM   Abbreviations: M myopia (nearsighted); A astigmatism; H hyperopia (farsighted); P presbyopia; Mrx spectacle prescription;  CTL contact lenses; OD right eye; OS left eye; OU both eyes  XT exotropia; ET esotropia; PEK punctate epithelial keratitis; PEE punctate epithelial erosions; DES dry eye syndrome; MGD meibomian gland dysfunction; ATs artificial tears; PFAT's preservative free artificial tears; [place] nuclear sclerotic cataract; PSC posterior subcapsular cataract; ERM epi-retinal membrane; PVD posterior vitreous detachment; RD retinal detachment; DM diabetes mellitus; DR diabetic retinopathy; NPDR non-proliferative diabetic retinopathy; PDR proliferative diabetic retinopathy; CSME clinically significant macular edema; DME diabetic macular edema; dbh dot blot hemorrhages; CWS cotton wool spot; POAG primary open angle glaucoma; C/D cup-to-disc ratio; HVF humphrey  visual field; GVF goldmann visual field; OCT optical coherence tomography; IOP intraocular pressure; BRVO Branch retinal vein occlusion; CRVO central retinal vein occlusion; CRAO central retinal artery occlusion; BRAO branch retinal artery occlusion; RT retinal tear; SB scleral buckle; PPV pars plana vitrectomy; VH Vitreous hemorrhage; PRP panretinal laser photocoagulation; IVK intravitreal kenalog; VMT vitreomacular traction; MH Macular hole;  NVD neovascularization of the disc; NVE neovascularization elsewhere; AREDS age related eye disease study; ARMD age related macular degeneration; POAG primary open angle glaucoma; EBMD epithelial/anterior basement membrane dystrophy; ACIOL anterior chamber intraocular lens; IOL intraocular lens; PCIOL posterior chamber intraocular lens; Phaco/IOL phacoemulsification with intraocular lens placement; [place] photorefractive keratectomy; LASIK laser assisted in situ keratomileusis; HTN hypertension; DM diabetes mellitus; COPD chronic obstructive pulmonary disease

## 2020-06-03 ENCOUNTER — Other Ambulatory Visit: Payer: Self-pay

## 2020-06-03 ENCOUNTER — Ambulatory Visit (INDEPENDENT_AMBULATORY_CARE_PROVIDER_SITE_OTHER): Payer: Medicare Other | Admitting: Ophthalmology

## 2020-06-03 ENCOUNTER — Encounter (INDEPENDENT_AMBULATORY_CARE_PROVIDER_SITE_OTHER): Payer: Self-pay | Admitting: Ophthalmology

## 2020-06-03 DIAGNOSIS — H33323 Round hole, bilateral: Secondary | ICD-10-CM | POA: Diagnosis not present

## 2020-06-03 DIAGNOSIS — H35033 Hypertensive retinopathy, bilateral: Secondary | ICD-10-CM

## 2020-06-03 DIAGNOSIS — H35413 Lattice degeneration of retina, bilateral: Secondary | ICD-10-CM | POA: Diagnosis not present

## 2020-06-03 DIAGNOSIS — H25813 Combined forms of age-related cataract, bilateral: Secondary | ICD-10-CM

## 2020-06-03 DIAGNOSIS — H3581 Retinal edema: Secondary | ICD-10-CM

## 2020-06-03 DIAGNOSIS — E083313 Diabetes mellitus due to underlying condition with moderate nonproliferative diabetic retinopathy with macular edema, bilateral: Secondary | ICD-10-CM

## 2020-06-03 DIAGNOSIS — H04123 Dry eye syndrome of bilateral lacrimal glands: Secondary | ICD-10-CM

## 2020-06-03 DIAGNOSIS — I10 Essential (primary) hypertension: Secondary | ICD-10-CM

## 2020-06-08 NOTE — Patient Instructions (Signed)
Cindy Park  06/08/2020     @PREFPERIOPPHARMACY @   Your procedure is scheduled on  06/11/2020  Report to [facility] at  [place].M.  Call this number if you have problems the morning of surgery:  [phone]   Remember:  Do not eat or drink after midnight.                         Take these medicines the morning of surgery with A SIP OF WATER  Amlodipine, carvedilol, klonopin, gabapentin, levothyroxine. Use your nebulizer and your inhaler before you come. DO NOT take any medications for diabetes the morning of your procedure    Do not wear jewelry, make-up or nail polish.  Do not wear lotions, powders, or perfumes, or deodorant. Please brush your teeth.  Do not shave 48 hours prior to surgery.  Men may shave face and neck.  Do not bring valuables to the hospital.  [facility] is not responsible for any belongings or valuables.  Contacts, dentures or bridgework may not be worn into surgery.  Leave your suitcase in the car.  After surgery it may be brought to your room.  For patients admitted to the hospital, discharge time will be determined by your treatment team.  Patients discharged the day of surgery will not be allowed to drive home.   Name and phone number of your driver:   family Special instructions:  DO NOT smoke the morning of your procedure.  Please read over the following fact sheets that you were given. Anesthesia Post-op Instructions and Care and Recovery After Surgery       AV Fistula Placement, Care After This sheet gives you information about how to care for yourself after your procedure. Your health care provider may also give you more specific instructions. If you have problems or questions, contact your health care provider. What can I expect after the procedure? After the procedure, it is common to:  Feel sore.  Feel a vibration (thrill) over the fistula. Follow these instructions at home: Incision care      Follow instructions  from your health care provider about how to take care of your incision. Make sure you: ? Wash your hands with soap and water before and after you change your bandage (dressing). If soap and water are not available, use hand sanitizer. ? Change your dressing as told by your health care provider. ? Leave stitches (sutures), skin glue, or adhesive strips in place. These skin closures may need to stay in place for 2 weeks or longer. If adhesive strip edges start to loosen and curl up, you may trim the loose edges. Do not remove adhesive strips completely unless your health care provider tells you to do that. Fistula care  Check your fistula site every day to make sure the thrill feels the same.  Check your fistula site every day for signs of infection. Check for: ? Redness, swelling, or pain. ? Fluid or blood. ? Warmth. ? Pus or a bad smell.  Raise (elevate) the affected area above the level of your heart while you are sitting or lying down.  Do not lift anything that is heavier than 10 lb (4.5 kg), or the limit that you are told, until your health care provider says that it is safe.  Do not lie down on your fistula arm.  Do not let anyone draw blood or take a blood pressure reading  on your fistula arm. This is important.  Do not wear tight jewelry or clothing over your fistula arm. Bathing  Do not take baths, swim, or use a hot tub until your health care provider approves. Ask your health care provider if you may take showers. You may only be allowed to take sponge baths.  Keep the area around your incision clean and dry. Medicines  Take over-the-counter and prescription medicines only as told by your health care provider.  Ask your health care provider if any medicine prescribed to you can cause constipation. You may need to take steps to prevent or treat constipation, such as: ? Drink enough fluid to keep your urine pale yellow. ? Take over-the-counter or prescription  medicines. ? Eat foods that are high in fiber, such as beans, whole grains, and fresh fruits and vegetables. ? Limit foods that are high in fat and processed sugars, such as fried or sweet foods. General instructions  Rest at home for a day or two.  Return to your normal activities as told by your health care provider. Ask your health care provider what activities are safe for you.  Keep all follow-up visits as told by your health care provider. This is important. Contact a health care provider if:  You have redness, swelling, or pain around your fistula site.  Your fistula site feels warm to the touch.  You have pus or a bad smell coming from your fistula site.  You have a fever.  You have numbness or coldness at your fistula site.  You feel a decrease or a change in the thrill. Get help right away if you:  Are bleeding from your fistula site.  Have chest pain.  Have trouble breathing. Summary  Follow instructions from your health care provider about how to take care of your incision.  Do not let anyone draw blood or take a blood pressure reading on your fistula arm. This is important.  Return to your normal activities as told by your health care provider. Ask your health care provider what activities are safe for you.  Contact a health care provider if you have a change in the thrill or have any signs of infection at your fistula site.  Keep all follow-up visits as told by your health care provider. This is important. This information is not intended to replace advice given to you by your health care provider. Make sure you discuss any questions you have with your health care provider. Document Revised: 01/25/2019 Document Reviewed: 02/12/2018 Elsevier Patient Education  [place]. How to Use Chlorhexidine for Bathing Chlorhexidine gluconate (CHG) is a germ-killing (antiseptic) solution that is used to clean the skin. It can get rid of the bacteria that  normally live on the skin and can keep them away for about 24 hours. To clean your skin with CHG, you may be given:  A CHG solution to use in the shower or as part of a sponge bath.  A prepackaged cloth that contains CHG. Cleaning your skin with CHG may help lower the risk for infection:  While you are staying in the intensive care unit of the hospital.  If you have a vascular access, such as a central line, to provide short-term or long-term access to your veins.  If you have a catheter to drain urine from your bladder.  If you are on a ventilator. A ventilator is a machine that helps you breathe by moving air in and out of your lungs.  After surgery. What are the risks? Risks of using CHG include:  A skin reaction.  Hearing loss, if CHG gets in your ears.  Eye injury, if CHG gets in your eyes and is not rinsed out.  The CHG product catching fire. Make sure that you avoid smoking and flames after applying CHG to your skin. Do not use CHG:  If you have a chlorhexidine allergy or have previously reacted to chlorhexidine.  On babies younger than 69 months of age. How to use CHG solution  Use CHG only as told by your health care provider, and follow the instructions on the label.  Use the full amount of CHG as directed. Usually, this is one bottle. During a shower Follow these steps when using CHG solution during a shower (unless your health care provider gives you different instructions): 1. Start the shower. 2. Use your normal soap and shampoo to wash your face and hair. 3. Turn off the shower or move out of the shower stream. 4. Pour the CHG onto a clean washcloth. Do not use any type of brush or rough-edged sponge. 5. Starting at your neck, lather your body down to your toes. Make sure you follow these instructions: ? If you will be having surgery, pay special attention to the part of your body where you will be having surgery. Scrub this area for at least 1 minute. ? Do  not use CHG on your head or face. If the solution gets into your ears or eyes, rinse them well with water. ? Avoid your genital area. ? Avoid any areas of skin that have broken skin, cuts, or scrapes. ? Scrub your back and under your arms. Make sure to wash skin folds. 6. Let the lather sit on your skin for 1-2 minutes or as long as told by your health care provider. 7. Thoroughly rinse your entire body in the shower. Make sure that all body creases and crevices are rinsed well. 8. Dry off with a clean towel. Do not put any substances on your body afterward--such as powder, lotion, or perfume--unless you are told to do so by your health care provider. Only use lotions that are recommended by the manufacturer. 9. Put on clean clothes or pajamas. 10. If it is the night before your surgery, sleep in clean sheets.  During a sponge bath Follow these steps when using CHG solution during a sponge bath (unless your health care provider gives you different instructions): 1. Use your normal soap and shampoo to wash your face and hair. 2. Pour the CHG onto a clean washcloth. 3. Starting at your neck, lather your body down to your toes. Make sure you follow these instructions: ? If you will be having surgery, pay special attention to the part of your body where you will be having surgery. Scrub this area for at least 1 minute. ? Do not use CHG on your head or face. If the solution gets into your ears or eyes, rinse them well with water. ? Avoid your genital area. ? Avoid any areas of skin that have broken skin, cuts, or scrapes. ? Scrub your back and under your arms. Make sure to wash skin folds. 4. Let the lather sit on your skin for 1-2 minutes or as long as told by your health care provider. 5. Using a different clean, wet washcloth, thoroughly rinse your entire body. Make sure that all body creases and crevices are rinsed well. 6. Dry off with a clean towel. Do not  put any substances on your body  afterward--such as powder, lotion, or perfume--unless you are told to do so by your health care provider. Only use lotions that are recommended by the manufacturer. 7. Put on clean clothes or pajamas. 8. If it is the night before your surgery, sleep in clean sheets. How to use CHG prepackaged cloths  Only use CHG cloths as told by your health care provider, and follow the instructions on the label.  Use the CHG cloth on clean, dry skin.  Do not use the CHG cloth on your head or face unless your health care provider tells you to.  When washing with the CHG cloth: ? Avoid your genital area. ? Avoid any areas of skin that have broken skin, cuts, or scrapes. Before surgery Follow these steps when using a CHG cloth to clean before surgery (unless your health care provider gives you different instructions): 1. Using the CHG cloth, vigorously scrub the part of your body where you will be having surgery. Scrub using a back-and-forth motion for 3 minutes. The area on your body should be completely wet with CHG when you are done scrubbing. 2. Do not rinse. Discard the cloth and let the area air-dry. Do not put any substances on the area afterward, such as powder, lotion, or perfume. 3. Put on clean clothes or pajamas. 4. If it is the night before your surgery, sleep in clean sheets.  For general bathing Follow these steps when using CHG cloths for general bathing (unless your health care provider gives you different instructions). 1. Use a separate CHG cloth for each area of your body. Make sure you wash between any folds of skin and between your fingers and toes. Wash your body in the following order, switching to a new cloth after each step: ? The front of your neck, shoulders, and chest. ? Both of your arms, under your arms, and your hands. ? Your stomach and groin area, avoiding the genitals. ? Your right leg and foot. ? Your left leg and foot. ? The back of your neck, your back, and your  buttocks. 2. Do not rinse. Discard the cloth and let the area air-dry. Do not put any substances on your body afterward--such as powder, lotion, or perfume--unless you are told to do so by your health care provider. Only use lotions that are recommended by the manufacturer. 3. Put on clean clothes or pajamas. Contact a health care provider if:  Your skin gets irritated after scrubbing.  You have questions about using your solution or cloth. Get help right away if:  Your eyes become very red or swollen.  Your eyes itch badly.  Your skin itches badly and is red or swollen.  Your hearing changes.  You have trouble seeing.  You have swelling or tingling in your mouth or throat.  You have trouble breathing.  You swallow any chlorhexidine. Summary  Chlorhexidine gluconate (CHG) is a germ-killing (antiseptic) solution that is used to clean the skin. Cleaning your skin with CHG may help to lower your risk for infection.  You may be given CHG to use for bathing. It may be in a bottle or in a prepackaged cloth to use on your skin. Carefully follow your health care provider's instructions and the instructions on the product label.  Do not use CHG if you have a chlorhexidine allergy.  Contact your health care provider if your skin gets irritated after scrubbing. This information is not intended to replace advice given to  you by your health care provider. Make sure you discuss any questions you have with your health care provider. Document Revised: 10/25/2018 Document Reviewed: 07/06/2017 Elsevier Patient Education  [place].

## 2020-06-09 ENCOUNTER — Encounter (HOSPITAL_COMMUNITY): Payer: Self-pay

## 2020-06-09 ENCOUNTER — Encounter (HOSPITAL_COMMUNITY)
Admission: RE | Admit: 2020-06-09 | Discharge: 2020-06-09 | Disposition: A | Discharge status: Home / Self Care | Payer: Medicare Other | Source: Ambulatory Visit | Attending: Vascular Surgery | Admitting: Vascular Surgery

## 2020-06-09 ENCOUNTER — Other Ambulatory Visit: Payer: Self-pay

## 2020-06-09 ENCOUNTER — Other Ambulatory Visit (HOSPITAL_COMMUNITY)
Admission: RE | Admit: 2020-06-09 | Discharge: 2020-06-09 | Disposition: A | Discharge status: Home / Self Care | Payer: Medicare Other | Source: Ambulatory Visit | Attending: Vascular Surgery | Admitting: Vascular Surgery

## 2020-06-09 DIAGNOSIS — Z01812 Encounter for preprocedural laboratory examination: Secondary | ICD-10-CM | POA: Diagnosis not present

## 2020-06-09 DIAGNOSIS — Z20822 Contact with and (suspected) exposure to covid-19: Secondary | ICD-10-CM | POA: Diagnosis not present

## 2020-06-09 HISTORY — DX: Family history of other specified conditions: Z84.89

## 2020-06-09 LAB — CBC WITH DIFFERENTIAL/PLATELET
Abs Immature Granulocytes: 0.06 10*3/uL (ref 0.00–0.07)
Basophils Absolute: 0 10*3/uL (ref 0.0–0.1)
Basophils Relative: 0 %
Eosinophils Absolute: 0.3 10*3/uL (ref 0.0–0.5)
Eosinophils Relative: 4 %
HCT: 29.1 % — ABNORMAL LOW (ref 36.0–46.0)
Hemoglobin: 9.4 g/dL — ABNORMAL LOW (ref 12.0–15.0)
Immature Granulocytes: 1 %
Lymphocytes Relative: 25 %
Lymphs Abs: 2 10*3/uL (ref 0.7–4.0)
MCH: 27.6 pg (ref 26.0–34.0)
MCHC: 32.3 g/dL (ref 30.0–36.0)
MCV: 85.3 fL (ref 80.0–100.0)
Monocytes Absolute: 0.5 10*3/uL (ref 0.1–1.0)
Monocytes Relative: 7 %
Neutro Abs: 5.1 10*3/uL (ref 1.7–7.7)
Neutrophils Relative %: 63 %
Platelets: 293 10*3/uL (ref 150–400)
RBC: 3.41 MIL/uL — ABNORMAL LOW (ref 3.87–5.11)
RDW: 13.9 % (ref 11.5–15.5)
WBC: 8.1 10*3/uL (ref 4.0–10.5)
nRBC: 0 % (ref 0.0–0.2)

## 2020-06-09 LAB — BASIC METABOLIC PANEL
Anion gap: 14 (ref 5–15)
BUN: 51 mg/dL — ABNORMAL HIGH (ref 8–23)
CO2: 21 mmol/L — ABNORMAL LOW (ref 22–32)
Calcium: 9.1 mg/dL (ref 8.9–10.3)
Chloride: 106 mmol/L (ref 98–111)
Creatinine, Ser: 4.11 mg/dL — ABNORMAL HIGH (ref 0.44–1.00)
GFR, Estimated: 11 mL/min — ABNORMAL LOW (ref >60)
Glucose, Bld: 121 mg/dL — ABNORMAL HIGH (ref 70–99)
Potassium: 4 mmol/L (ref 3.5–5.1)
Sodium: 141 mmol/L (ref 135–145)

## 2020-06-10 LAB — SARS CORONAVIRUS 2 (TAT 6-24 HRS): SARS Coronavirus 2: NEGATIVE

## 2020-06-10 LAB — HEMOGLOBIN A1C
Hgb A1c MFr Bld: 6.7 % — ABNORMAL HIGH (ref 4.8–5.6)
Mean Plasma Glucose: 146 mg/dL

## 2020-06-10 NOTE — Pre-Procedure Instructions (Signed)
Hgba1c routed to PCP. 

## 2020-06-11 ENCOUNTER — Ambulatory Visit (HOSPITAL_COMMUNITY): Payer: Medicare Other | Admitting: Anesthesiology

## 2020-06-11 ENCOUNTER — Ambulatory Visit (HOSPITAL_COMMUNITY)
Admission: RE | Admit: 2020-06-11 | Discharge: 2020-06-11 | Disposition: A | Discharge status: Home / Self Care | Payer: Medicare Other | Attending: Vascular Surgery | Admitting: Vascular Surgery

## 2020-06-11 ENCOUNTER — Encounter (HOSPITAL_COMMUNITY): Payer: Self-pay | Admitting: Vascular Surgery

## 2020-06-11 ENCOUNTER — Encounter (HOSPITAL_COMMUNITY)
Admission: RE | Disposition: A | Discharge status: Home / Self Care | Payer: Self-pay | Source: Home / Self Care | Attending: Vascular Surgery

## 2020-06-11 DIAGNOSIS — E1122 Type 2 diabetes mellitus with diabetic chronic kidney disease: Secondary | ICD-10-CM | POA: Insufficient documentation

## 2020-06-11 DIAGNOSIS — Z882 Allergy status to sulfonamides status: Secondary | ICD-10-CM | POA: Insufficient documentation

## 2020-06-11 DIAGNOSIS — Z794 Long term (current) use of insulin: Secondary | ICD-10-CM | POA: Insufficient documentation

## 2020-06-11 DIAGNOSIS — J449 Chronic obstructive pulmonary disease, unspecified: Secondary | ICD-10-CM | POA: Diagnosis not present

## 2020-06-11 DIAGNOSIS — K219 Gastro-esophageal reflux disease without esophagitis: Secondary | ICD-10-CM | POA: Insufficient documentation

## 2020-06-11 DIAGNOSIS — Z7982 Long term (current) use of aspirin: Secondary | ICD-10-CM | POA: Diagnosis not present

## 2020-06-11 DIAGNOSIS — E11319 Type 2 diabetes mellitus with unspecified diabetic retinopathy without macular edema: Secondary | ICD-10-CM | POA: Diagnosis not present

## 2020-06-11 DIAGNOSIS — Z79899 Other long term (current) drug therapy: Secondary | ICD-10-CM | POA: Diagnosis not present

## 2020-06-11 DIAGNOSIS — D631 Anemia in chronic kidney disease: Secondary | ICD-10-CM | POA: Insufficient documentation

## 2020-06-11 DIAGNOSIS — N185 Chronic kidney disease, stage 5: Secondary | ICD-10-CM | POA: Diagnosis not present

## 2020-06-11 DIAGNOSIS — I129 Hypertensive chronic kidney disease with stage 1 through stage 4 chronic kidney disease, or unspecified chronic kidney disease: Secondary | ICD-10-CM | POA: Insufficient documentation

## 2020-06-11 DIAGNOSIS — Z91018 Allergy to other foods: Secondary | ICD-10-CM | POA: Diagnosis not present

## 2020-06-11 DIAGNOSIS — N184 Chronic kidney disease, stage 4 (severe): Secondary | ICD-10-CM | POA: Diagnosis not present

## 2020-06-11 DIAGNOSIS — Z888 Allergy status to other drugs, medicaments and biological substances status: Secondary | ICD-10-CM | POA: Diagnosis not present

## 2020-06-11 HISTORY — PX: AV FISTULA PLACEMENT: SHX1204

## 2020-06-11 LAB — GLUCOSE, CAPILLARY
Glucose-Capillary: 176 mg/dL — ABNORMAL HIGH (ref 70–99)
Glucose-Capillary: 159 mg/dL — ABNORMAL HIGH (ref 70–99)

## 2020-06-11 SURGERY — ARTERIOVENOUS (AV) FISTULA CREATION
Anesthesia: General | Site: Arm Upper | Laterality: Left

## 2020-06-11 MED ORDER — FENTANYL CITRATE (PF) 100 MCG/2ML IJ SOLN
INTRAMUSCULAR | Status: DC | PRN
  Administered 2020-06-11 (×2): 25 ug via INTRAVENOUS

## 2020-06-11 MED ORDER — SODIUM CHLORIDE 0.9 % IV SOLN: INTRAVENOUS | Status: DC | PRN

## 2020-06-11 MED ORDER — PHENYLEPHRINE HCL-NACL 10-0.9 MG/250ML-% IV SOLN
INTRAVENOUS | Status: DC | PRN
  Administered 2020-06-11: 20 ug/min via INTRAVENOUS

## 2020-06-11 MED ORDER — LIDOCAINE HCL (CARDIAC) PF 100 MG/5ML IV SOSY
PREFILLED_SYRINGE | INTRAVENOUS | Status: DC | PRN
  Administered 2020-06-11: 60 mg via INTRAVENOUS

## 2020-06-11 MED ORDER — SODIUM CHLORIDE 0.9 % IV SOLN
INTRAVENOUS | Status: DC | PRN
  Administered 2020-06-11: 500 mL

## 2020-06-11 MED ORDER — CEFAZOLIN SODIUM-DEXTROSE 2-4 GM/100ML-% IV SOLN
2.0000 g | INTRAVENOUS | Status: AC
  Administered 2020-06-11: 2 g via INTRAVENOUS
  Filled 2020-06-11: qty 100

## 2020-06-11 MED ORDER — ORAL CARE MOUTH RINSE: 15.0000 mL | Freq: Once | OROMUCOSAL | Status: AC

## 2020-06-11 MED ORDER — HEPARIN SODIUM (PORCINE) 1000 UNIT/ML IJ SOLN
INTRAMUSCULAR | Status: AC
  Filled 2020-06-11: qty 6

## 2020-06-11 MED ORDER — PROPOFOL 10 MG/ML IV BOLUS
INTRAVENOUS | Status: AC
  Filled 2020-06-11: qty 40

## 2020-06-11 MED ORDER — LIDOCAINE-EPINEPHRINE 0.5 %-1:200000 IJ SOLN
INTRAMUSCULAR | Status: AC
  Filled 2020-06-11: qty 1

## 2020-06-11 MED ORDER — SODIUM CHLORIDE 0.9 % IV SOLN: INTRAVENOUS | Status: DC

## 2020-06-11 MED ORDER — OXYCODONE-ACETAMINOPHEN 5-325 MG PO TABS
1.0000 | ORAL_TABLET | Freq: Four times a day (QID) | ORAL | 0 refills | Status: DC | PRN
Start: 2020-06-11 — End: 2020-06-18

## 2020-06-11 MED ORDER — PROPOFOL 500 MG/50ML IV EMUL
INTRAVENOUS | Status: DC | PRN
  Administered 2020-06-11: 150 ug/kg/min via INTRAVENOUS

## 2020-06-11 MED ORDER — CHLORHEXIDINE GLUCONATE 0.12 % MT SOLN
15.0000 mL | Freq: Once | OROMUCOSAL | Status: AC
  Administered 2020-06-11: 15 mL via OROMUCOSAL

## 2020-06-11 MED ORDER — CHLORHEXIDINE GLUCONATE 4 % EX LIQD: 60.0000 mL | Freq: Once | CUTANEOUS | Status: DC

## 2020-06-11 MED ORDER — 0.9 % SODIUM CHLORIDE (POUR BTL) OPTIME
TOPICAL | Status: DC | PRN
  Administered 2020-06-11: 1000 mL

## 2020-06-11 MED ORDER — FENTANYL CITRATE (PF) 100 MCG/2ML IJ SOLN
INTRAMUSCULAR | Status: AC
  Filled 2020-06-11: qty 2

## 2020-06-11 MED ORDER — ONDANSETRON HCL 4 MG/2ML IJ SOLN: 4.0000 mg | Freq: Once | INTRAMUSCULAR | Status: DC | PRN

## 2020-06-11 MED ORDER — LIDOCAINE-EPINEPHRINE 0.5 %-1:200000 IJ SOLN
INTRAMUSCULAR | Status: DC | PRN
  Administered 2020-06-11: 19 mL

## 2020-06-11 MED ORDER — FENTANYL CITRATE (PF) 100 MCG/2ML IJ SOLN: 25.0000 ug | INTRAMUSCULAR | Status: DC | PRN

## 2020-06-11 MED ORDER — PROPOFOL 10 MG/ML IV BOLUS
INTRAVENOUS | Status: AC
  Filled 2020-06-11: qty 20

## 2020-06-11 SURGICAL SUPPLY — 37 items
ADH SKN CLS APL DERMABOND .7 (GAUZE/BANDAGES/DRESSINGS) ×1
ARMBAND PINK RESTRICT EXTREMIT (MISCELLANEOUS) ×2 IMPLANT
BAG DECANTER FOR FLEXI CONT (MISCELLANEOUS) ×2 IMPLANT
BAG HAMPER (MISCELLANEOUS) ×2 IMPLANT
CANNULA VESSEL 3MM 2 BLNT TIP (CANNULA) ×2 IMPLANT
CLIP LIGATING EXTRA MED SLVR (CLIP) ×2 IMPLANT
CLIP LIGATING EXTRA SM BLUE (MISCELLANEOUS) ×2 IMPLANT
COVER LIGHT HANDLE STERIS (MISCELLANEOUS) ×4 IMPLANT
COVER MAYO STAND XLG (MISCELLANEOUS) ×2 IMPLANT
COVER PROBE W GEL 5X96 (DRAPES) ×2 IMPLANT
COVER WAND RF STERILE (DRAPES) ×2 IMPLANT
DECANTER SPIKE VIAL GLASS SM (MISCELLANEOUS) ×2 IMPLANT
DERMABOND ADVANCED (GAUZE/BANDAGES/DRESSINGS) ×1
DERMABOND ADVANCED .7 DNX12 (GAUZE/BANDAGES/DRESSINGS) ×1 IMPLANT
ELECT REM PT RETURN 9FT ADLT (ELECTROSURGICAL) ×2
ELECTRODE REM PT RTRN 9FT ADLT (ELECTROSURGICAL) ×1 IMPLANT
GLOVE BIOGEL PI IND STRL 7.0 (GLOVE) ×3 IMPLANT
GLOVE BIOGEL PI INDICATOR 7.0 (GLOVE) ×3
GLOVE SURG SS PI 6.5 STRL IVOR (GLOVE) ×2 IMPLANT
GOWN STRL REUS W/ TWL LRG LVL3 (GOWN DISPOSABLE) ×1 IMPLANT
GOWN STRL REUS W/TWL LRG LVL3 (GOWN DISPOSABLE) ×6 IMPLANT
IV NS 500ML (IV SOLUTION) ×2
IV NS 500ML BAXH (IV SOLUTION) ×1 IMPLANT
KIT BLADEGUARD II DBL (SET/KITS/TRAYS/PACK) ×2 IMPLANT
KIT TURNOVER KIT A (KITS) ×2 IMPLANT
MANIFOLD NEPTUNE II (INSTRUMENTS) ×2 IMPLANT
MARKER SKIN DUAL TIP RULER LAB (MISCELLANEOUS) ×4 IMPLANT
NS IRRIG 1000ML POUR BTL (IV SOLUTION) ×2 IMPLANT
PACK CV ACCESS (CUSTOM PROCEDURE TRAY) ×2 IMPLANT
PAD ARMBOARD 7.5X6 YLW CONV (MISCELLANEOUS) ×4 IMPLANT
SET BASIN LINEN APH (SET/KITS/TRAYS/PACK) ×2 IMPLANT
SOL PREP POV-IOD 4OZ 10% (MISCELLANEOUS) ×2 IMPLANT
SOL PREP PROV IODINE SCRUB 4OZ (MISCELLANEOUS) ×2 IMPLANT
SUT PROLENE 6 0 CC (SUTURE) ×4 IMPLANT
SUT VIC AB 3-0 SH 27 (SUTURE) ×2
SUT VIC AB 3-0 SH 27X BRD (SUTURE) ×1 IMPLANT
UNDERPAD 30X36 HEAVY ABSORB (UNDERPADS AND DIAPERS) ×2 IMPLANT

## 2020-06-11 NOTE — Discharge Instructions (Signed)
[facility]  Discharge Instructions  AV Fistula or Graft Surgery for Dialysis Access  Please refer to the following instructions for your post-procedure care. Your surgeon or physician assistant will discuss any changes with you.  Activity  You may drive the day following your surgery, if you are comfortable and no longer taking prescription pain medication. Resume full activity as the soreness in your incision resolves.  Bathing/Showering  You may shower after you go home. Keep your incision dry for 48 hours. Do not soak in a bathtub, hot tub, or swim until the incision heals completely. You may not shower if you have a hemodialysis catheter.  Incision Care  Clean your incision with mild soap and water after 48 hours. Pat the area dry with a clean towel. You do not need a bandage unless otherwise instructed. Do not apply any ointments or creams to your incision. You may have skin glue on your incision. Do not peel it off. It will come off on its own in about one week. Your arm may swell a bit after surgery. To reduce swelling use pillows to elevate your arm so it is above your heart. Your doctor will tell you if you need to lightly wrap your arm with an ACE bandage.  Diet  Resume your normal diet. There are not special food restrictions following this procedure. In order to heal from your surgery, it is CRITICAL to get adequate nutrition. Your body requires vitamins, minerals, and protein. Vegetables are the best source of vitamins and minerals. Vegetables also provide the perfect balance of protein. Processed food has little nutritional value, so try to avoid this.  Medications  Resume taking all of your medications. If your incision is causing pain, you may take over-the counter pain relievers such as acetaminophen (Tylenol). If you were prescribed a stronger pain medication, please be aware these medications can cause nausea and constipation. Prevent  nausea by taking the medication with a snack or meal. Avoid constipation by drinking plenty of fluids and eating foods with high amount of fiber, such as fruits, vegetables, and grains.  Do not take Tylenol if you are taking prescription pain medications.  Follow up Your surgeon may want to see you in the office following your access surgery. If so, this will be arranged at the time of your surgery.  Please call us immediately for any of the following conditions:   Increased pain, redness, drainage (pus) from your incision site  Fever of 101 degrees or higher  Severe or worsening pain at your incision site  Hand pain or numbness.   Reduce your risk of vascular disease:   Stop smoking. If you would like help, call [facility] at 1-800-QUIT-NOW 548 406 1163) or [place] at [place] your cholesterol  Maintain a desired weight  Control your diabetes  Keep your blood pressure down  Dialysis  It will take several weeks to several months for your new dialysis access to be ready for use. Your surgeon will determine when it is okay to use it. Your nephrologist will continue to direct your dialysis. You can continue to use your Permcath until your new access is ready for use.   06/11/2020 Cindy Park [id number] [birth year]  Surgeon(s): Early, Arvilla Meres, MD  Procedure(s): LEFT ARM ARTERIOVENOUS (AV) FISTULA CREATION   May stick graft immediately   May stick graft on designated area only:    Do not stick fistula for 12 weeks    If  you have any questions, please call the office at [phone].

## 2020-06-11 NOTE — Op Note (Signed)
    OPERATIVE REPORT  DATE OF SURGERY: 06/11/2020  PATIENT: Cindy Park, 64 y.o. female MRN: [id number]  DOB: Jan 17, 1956  PRE-OPERATIVE DIAGNOSIS: Chronic renal insufficiency  POST-OPERATIVE DIAGNOSIS:  Same  PROCEDURE: Left first stage brachiobasilic fistula  SURGEON:  Curt Jews, M.D.  PHYSICIAN ASSISTANT: Nurse  The assistant was needed for exposure and to expedite the case  ANESTHESIA: Local with sedation  EBL: per anesthesia record  Total I/O In: 100 [IV Piggyback:100] Out: -   BLOOD ADMINISTERED: none  DRAINS: none  SPECIMEN: none  COUNTS CORRECT:  YES  PATIENT DISPOSITION:  PACU - hemodynamically stable  PROCEDURE DETAILS: The patient was taken the operative placed supine position with area of the left arm prepped draped you sterile fashion.  SonoSite ultrasound was used to visualize the veins.  The patient had a very small cephalic vein.  Did have moderate size basilic vein at the antecubital space and much larger in the mid upper arm proximally.  The basilic vein was on the medial aspect of the arm.  Using local anesthesia incision was made over the basilic vein and was exposed from the proximal forearm to above the elbow.  Multiple tributary branches were ligated with 3-0 and 4-0 silk ties and divided.  The vein was divided distally and was gently dilated with heparinized saline and was adequate size for fistula creation.  The brachial artery was exposed to a separate incision over the antecubital space.  The artery was of good caliber with minimal atherosclerotic change.  The artery was encircled with a blue vessel loop.  The artery was occluded proximally and distally and was opened with an 11 blade and sent longstanding with Potts scissors.  The vein was brought to approximation with the artery.  The vein was cut to the appropriate length and spatulated and sewn end-to-side to the artery with a running 6-0 Prolene suture.  Prior to completion of the closure a 2  dilator passed easily through the proximal and distal portion of the anastomosis.  Anastomosis was completed and excellent thrill was noted.  The wounds irrigated with saline.  Hemostasis was obtained with electrocautery.  The wounds were closed with 3-0 Vicryl in the subcutaneous and subcuticular tissue.  Sterile dressing was applied and the patient was transferred to the recovery room in stable condition   [name], M.D., [facility] 06/11/2020 9:04 AM

## 2020-06-11 NOTE — Transfer of Care (Signed)
Immediate Anesthesia Transfer of Care Note  Patient: EBONEE STOBER  Procedure(s) Performed: LEFT ARM ARTERIOVENOUS (AV) FISTULA CREATION (Left Arm Upper)  Patient Location: PACU  Anesthesia Type:General  Level of Consciousness: awake, alert , oriented and patient cooperative  Airway & Oxygen Therapy: Patient Spontanous Breathing and Patient connected to nasal cannula oxygen  Post-op Assessment: Report given to RN, Post -op Vital signs reviewed and stable and Patient moving all extremities X 4  Post vital signs: Reviewed and stable  Last Vitals:  Vitals Value Taken Time  BP 137/63 06/11/20 0910  Temp    Pulse    Resp 20 06/11/20 0912  SpO2    Vitals shown include unvalidated device data.  Last Pain:  Vitals:   06/11/20 0723  TempSrc: Oral  PainSc: 0-No pain      Patients Stated Pain Goal: 6 (47/07/61 [id number])  Complications: No complications documented.

## 2020-06-11 NOTE — Anesthesia Postprocedure Evaluation (Signed)
Anesthesia Post Note  Patient: Cindy Park  Procedure(s) Performed: LEFT ARM ARTERIOVENOUS (AV) FISTULA CREATION (Left Arm Upper)  Patient location during evaluation: PACU Anesthesia Type: General Level of consciousness: awake, oriented, awake and alert and patient cooperative Pain management: satisfactory to patient Vital Signs Assessment: post-procedure vital signs reviewed and stable Respiratory status: spontaneous breathing, respiratory function stable, nonlabored ventilation and patient connected to nasal cannula oxygen Cardiovascular status: stable Postop Assessment: no apparent nausea or vomiting Anesthetic complications: no   No complications documented.   Last Vitals:  Vitals:   06/11/20 0723  BP: 140/75  Pulse: 74  Resp: 18  Temp: 36.9 C  SpO2: 98%    Last Pain:  Vitals:   06/11/20 0723  TempSrc: Oral  PainSc: 0-No pain                 Nyoka Alcoser

## 2020-06-11 NOTE — Anesthesia Preprocedure Evaluation (Signed)
Anesthesia Evaluation  Patient identified by MRN, date of birth, ID band Patient awake    Reviewed: Allergy & Precautions, H&P , NPO status , Patient's Chart, lab work & pertinent test results, reviewed documented beta blocker date and time   Airway Mallampati: II  TM Distance: >3 FB Neck ROM: full    Dental no notable dental hx.    Pulmonary asthma , COPD,    Pulmonary exam normal breath sounds clear to auscultation       Cardiovascular Exercise Tolerance: Good hypertension, negative cardio ROS   Rhythm:regular Rate:Normal     Neuro/Psych negative neurological ROS  negative psych ROS   GI/Hepatic Neg liver ROS, GERD  Medicated,  Endo/Other  negative endocrine ROSdiabetes  Renal/GU CRF and ESRFRenal disease  negative genitourinary   Musculoskeletal   Abdominal   Peds  Hematology  (+) Blood dyscrasia, anemia ,   Anesthesia Other Findings   Reproductive/Obstetrics negative OB ROS                             Anesthesia Physical Anesthesia Plan  ASA: III  Anesthesia Plan: General   Post-op Pain Management:    Induction:   PONV Risk Score and Plan: Ondansetron  Airway Management Planned:   Additional Equipment:   Intra-op Plan:   Post-operative Plan:   Informed Consent: I have reviewed the patients History and Physical, chart, labs and discussed the procedure including the risks, benefits and alternatives for the proposed anesthesia with the patient or authorized representative who has indicated his/her understanding and acceptance.     Dental Advisory Given  Plan Discussed with: CRNA  Anesthesia Plan Comments:         Anesthesia Quick Evaluation

## 2020-06-11 NOTE — Interval H&P Note (Signed)
History and Physical Interval Note:  06/11/2020 7:10 AM  Cindy Park  has presented today for surgery, with the diagnosis of CHRONIC KIDNEY DISEASE STAGE IV.  The various methods of treatment have been discussed with the patient and family. After consideration of risks, benefits and other options for treatment, the patient has consented to  Procedure(s): LEFT ARM ARTERIOVENOUS (AV) FISTULA CREATION (Left) as a surgical intervention.  The patient's history has been reviewed, patient examined, no change in status, stable for surgery.  I have reviewed the patient's chart and labs.  Questions were answered to the patient's satisfaction.     Curt Jews

## 2020-06-12 ENCOUNTER — Encounter (HOSPITAL_COMMUNITY): Payer: Self-pay | Admitting: Vascular Surgery

## 2020-06-15 ENCOUNTER — Other Ambulatory Visit: Payer: Self-pay

## 2020-06-15 DIAGNOSIS — N184 Chronic kidney disease, stage 4 (severe): Secondary | ICD-10-CM

## 2020-06-16 ENCOUNTER — Encounter (HOSPITAL_COMMUNITY): Payer: Self-pay

## 2020-06-16 ENCOUNTER — Encounter (HOSPITAL_COMMUNITY)
Admission: RE | Admit: 2020-06-16 | Discharge: 2020-06-16 | Disposition: A | Discharge status: Home / Self Care | Payer: Medicare Other | Source: Ambulatory Visit | Attending: Nephrology | Admitting: Nephrology

## 2020-06-16 ENCOUNTER — Other Ambulatory Visit: Payer: Self-pay

## 2020-06-16 DIAGNOSIS — D631 Anemia in chronic kidney disease: Secondary | ICD-10-CM | POA: Diagnosis not present

## 2020-06-16 DIAGNOSIS — N184 Chronic kidney disease, stage 4 (severe): Secondary | ICD-10-CM | POA: Diagnosis not present

## 2020-06-16 LAB — RENAL FUNCTION PANEL
Albumin: 3.2 g/dL — ABNORMAL LOW (ref 3.5–5.0)
Anion gap: 14 (ref 5–15)
BUN: 53 mg/dL — ABNORMAL HIGH (ref 8–23)
CO2: 20 mmol/L — ABNORMAL LOW (ref 22–32)
Calcium: 9 mg/dL (ref 8.9–10.3)
Chloride: 106 mmol/L (ref 98–111)
Creatinine, Ser: 4.27 mg/dL — ABNORMAL HIGH (ref 0.44–1.00)
GFR, Estimated: 11 mL/min — ABNORMAL LOW (ref >60)
Glucose, Bld: 171 mg/dL — ABNORMAL HIGH (ref 70–99)
Phosphorus: 4.7 mg/dL — ABNORMAL HIGH (ref 2.5–4.6)
Potassium: 3.9 mmol/L (ref 3.5–5.1)
Sodium: 140 mmol/L (ref 135–145)

## 2020-06-16 LAB — CBC
HCT: 28.5 % — ABNORMAL LOW (ref 36.0–46.0)
Hemoglobin: 9.2 g/dL — ABNORMAL LOW (ref 12.0–15.0)
MCH: 27.3 pg (ref 26.0–34.0)
MCHC: 32.3 g/dL (ref 30.0–36.0)
MCV: 84.6 fL (ref 80.0–100.0)
Platelets: 267 10*3/uL (ref 150–400)
RBC: 3.37 MIL/uL — ABNORMAL LOW (ref 3.87–5.11)
RDW: 13.6 % (ref 11.5–15.5)
WBC: 6.8 10*3/uL (ref 4.0–10.5)
nRBC: 0 % (ref 0.0–0.2)

## 2020-06-16 LAB — POCT HEMOGLOBIN-HEMACUE: Hemoglobin: 9.3 g/dL — ABNORMAL LOW (ref 12.0–15.0)

## 2020-06-16 MED ORDER — EPOETIN ALFA 3000 UNIT/ML IJ SOLN
INTRAMUSCULAR | Status: AC
  Filled 2020-06-16: qty 1

## 2020-06-16 MED ORDER — EPOETIN ALFA 3000 UNIT/ML IJ SOLN
3000.0000 [IU] | Freq: Once | INTRAMUSCULAR | Status: AC
  Administered 2020-06-16: 3000 [IU] via SUBCUTANEOUS

## 2020-06-18 ENCOUNTER — Ambulatory Visit: Payer: Medicare Other

## 2020-06-18 ENCOUNTER — Telehealth: Payer: Self-pay

## 2020-06-18 ENCOUNTER — Other Ambulatory Visit: Payer: Self-pay | Admitting: Physician Assistant

## 2020-06-18 MED ORDER — OXYCODONE-ACETAMINOPHEN 5-325 MG PO TABS: 1.0000 | ORAL_TABLET | Freq: Four times a day (QID) | ORAL | 0 refills | Status: DC | PRN

## 2020-06-18 NOTE — Telephone Encounter (Signed)
Patient called with same symptoms of slight bleeding and severe pain (rating - 7-10) in the fistula arm. Had appt today but went to [place] office instead of [place]. She is r/s for Monday, but requested pain meds in the meantime. Discussed with PA and sent in RX. Informed patient who verbalized understanding.

## 2020-06-19 DIAGNOSIS — J449 Chronic obstructive pulmonary disease, unspecified: Secondary | ICD-10-CM | POA: Diagnosis not present

## 2020-06-19 DIAGNOSIS — E119 Type 2 diabetes mellitus without complications: Secondary | ICD-10-CM | POA: Diagnosis not present

## 2020-06-20 DIAGNOSIS — I10 Essential (primary) hypertension: Secondary | ICD-10-CM | POA: Diagnosis not present

## 2020-06-22 ENCOUNTER — Ambulatory Visit (INDEPENDENT_AMBULATORY_CARE_PROVIDER_SITE_OTHER): Payer: Self-pay | Admitting: Physician Assistant

## 2020-06-22 ENCOUNTER — Other Ambulatory Visit: Payer: Self-pay

## 2020-06-22 VITALS — BP 172/80 | HR 83 | Temp 98.2°F | Resp 20 | Ht 67.0 in | Wt 229.7 lb

## 2020-06-22 DIAGNOSIS — N185 Chronic kidney disease, stage 5: Secondary | ICD-10-CM

## 2020-06-22 MED ORDER — OXYCODONE-ACETAMINOPHEN 5-325 MG PO TABS: 1.0000 | ORAL_TABLET | Freq: Four times a day (QID) | ORAL | 0 refills | Status: DC | PRN

## 2020-06-22 NOTE — Progress Notes (Signed)
    Postoperative Access Visit   History of Present Illness   Cindy Park is a 64 y.o. year old female who presents for postoperative follow-up for: left first stage basilic vein transposition (Date: 06/11/20) by Dr. Donnetta Hutching.   She is having pain and occasional blood tinged drainage from her incisions.  She however believes the pain is improving day to day.  She is not yet on HD.  She denies signs or symptoms of steal syndrome in L hand.     Physical Examination   Vitals:   06/22/20 1256  BP: (!) 172/80  Pulse: 83  Resp: 20  Temp: 98.2 F (36.8 C)  TempSrc: Temporal  SpO2: 96%  Weight: 229 lb 11.2 oz (104.2 kg)  Height: 5\' 7"  (1.702 m)   Body mass index is 35.98 kg/m.  left arm Incisions are healing well, possible fluid collection noticed with palpation but this is not firm; palpable radial pulse, hand grip is 5/5, sensation in digits is intact, palpable thrill, bruit can  be auscultated     Medical Decision Making   Cindy Park is a 64 y.o. year old female who presents s/p left first stage basilic vein transposition   Patent basilic vein fistula without signs or symptoms of steal syndrome  Discussed hygiene around her incisions; also discussed possibility of further drainage given underlying fluid collection  I refilled her prescription for 5/325mg  percocet due to ongoing post operative pain  She has a follow up with fistula duplex scheduled with Dr. Donnetta Hutching at the [facility] office next month  The patient may follow up here on a prn basis   Dagoberto Ligas PA-C [facility] Office: [phone]  Clinic MD: Trula Slade

## 2020-06-26 ENCOUNTER — Encounter (HOSPITAL_COMMUNITY): Payer: Self-pay

## 2020-06-26 ENCOUNTER — Ambulatory Visit (HOSPITAL_COMMUNITY): Admission: RE | Admit: 2020-06-26 | Payer: Medicare Other | Source: Ambulatory Visit

## 2020-06-26 ENCOUNTER — Other Ambulatory Visit: Payer: Self-pay

## 2020-06-26 ENCOUNTER — Ambulatory Visit (HOSPITAL_COMMUNITY)
Admission: RE | Admit: 2020-06-26 | Discharge: 2020-06-26 | Disposition: A | Discharge status: Home / Self Care | Payer: Medicare Other | Source: Ambulatory Visit | Attending: Endocrinology | Admitting: Endocrinology

## 2020-06-26 DIAGNOSIS — E049 Nontoxic goiter, unspecified: Secondary | ICD-10-CM | POA: Diagnosis not present

## 2020-06-26 DIAGNOSIS — E042 Nontoxic multinodular goiter: Secondary | ICD-10-CM | POA: Diagnosis not present

## 2020-06-26 NOTE — Progress Notes (Signed)
[facility] Note  06/30/2020     CHIEF COMPLAINT Patient presents for Retina Follow Up   HISTORY OF PRESENT ILLNESS: Cindy Park is a 64 y.o. female who presents to the clinic today for:   HPI    Retina Follow Up    Patient presents with  Other.  In both eyes.  This started 3 weeks ago.  I, the attending physician,  performed the HPI with the patient and updated documentation appropriately.          Comments    Patient here for 3 weeks retina follow up for lattice degeneration OU. Patient states vision doing good. No eye pain. Had sx fiscula in left arm see dr 11-29. Was put on oxycodone.       Last edited by Bernarda Caffey, MD on 07/05/2020  1:43 AM. (History)    pt states vision is doing well, she had no problems after laser treatment and used PF QID for 7 days as directed, she denies fol   Referring physician: Leticia Clas OD [place] [address], [place] [address]  HISTORICAL INFORMATION:   Selected notes from the MEDICAL RECORD NUMBER Referred by Dr. Leticia Clas for DM eval LEE: 08.19.2021 BCVA OD: 20/20 OS: 20/20 Ocular Hx- cataract, CWS, macular edema PMH- DM, HTN, stage 3 kidney failure, CHF   CURRENT MEDICATIONS: Current Outpatient Medications (Ophthalmic Drugs)  Medication Sig   prednisoLONE acetate (PRED FORTE) 1 % ophthalmic suspension Place 1 drop into the left eye 4 (four) times daily.   No current facility-administered medications for this visit. (Ophthalmic Drugs)   Current Outpatient Medications (Other)  Medication Sig   acetaminophen (TYLENOL) 500 MG tablet Take 1,000 mg by mouth every 6 (six) hours as needed for moderate pain.    albuterol (PROVENTIL) (2.5 MG/3ML) 0.083% nebulizer solution Take 2.5 mg by nebulization 4 (four) times daily.    albuterol (VENTOLIN HFA) 108 (90 Base) MCG/ACT inhaler Inhale 2 puffs into the lungs every 6 (six) hours as needed for wheezing or shortness of breath.   amLODipine  (NORVASC) 5 MG tablet Take 5 mg by mouth 2 (two) times daily.   aspirin EC 81 MG tablet Take 81 mg by mouth daily.   atorvastatin (LIPITOR) 40 MG tablet Take 40 mg by mouth at bedtime.    calcitRIOL (ROCALTROL) 0.25 MCG capsule Take 0.25 mcg by mouth daily.   carvedilol (COREG) 12.5 MG tablet Take 12.5 mg by mouth daily.    Cholecalciferol (VITAMIN D) 125 MCG (5000 UT) CAPS Take 5,000 Units by mouth in the morning and at bedtime.    clonazePAM (KLONOPIN) 0.5 MG tablet Take 0.5 mg by mouth daily.   dicyclomine (BENTYL) 10 MG capsule Take 10 mg by mouth 2 (two) times daily.    epoetin alfa (EPOGEN) 3000 UNIT/ML injection Inject 3,000 Units into the vein every 14 (fourteen) days.   ferrous sulfate 325 (65 FE) MG tablet Take 325 mg by mouth 2 (two) times daily with a meal.   Fluticasone-Umeclidin-Vilant (TRELEGY ELLIPTA) 100-62.5-25 MCG/INH AEPB Inhale 1 puff into the lungs daily.   furosemide (LASIX) 80 MG tablet Take 80 mg by mouth 2 (two) times daily.    gabapentin (NEURONTIN) 300 MG capsule Take 300 mg by mouth 2 (two) times daily.    gemfibrozil (LOPID) 600 MG tablet Take 600 mg by mouth 2 (two) times daily before a meal.   Insulin Lispro Prot & Lispro (HUMALOG MIX 75/25  [place]) Inject 15-60 Units into the skin See admin instructions. Inject 60 units into the skin in the morning and 15 units at lunch and 60 units at supper   levothyroxine (SYNTHROID) 50 MCG tablet Take 50 mcg by mouth daily before breakfast.    montelukast (SINGULAIR) 10 MG tablet Take 10 mg by mouth at bedtime.   omeprazole (PRILOSEC) 20 MG capsule Take 20 mg by mouth daily.    oxyCODONE-acetaminophen (PERCOCET) 5-325 MG tablet Take 1 tablet by mouth every 6 (six) hours as needed for severe pain.   sitaGLIPtin (JANUVIA) 25 MG tablet Take 25 mg by mouth daily.   sodium bicarbonate 650 MG tablet Take 650 mg by mouth 2 (two) times daily.   vitamin B-12 (CYANOCOBALAMIN) 1000 MCG tablet Take 1,000 mcg by mouth  daily.    No current facility-administered medications for this visit. (Other)      REVIEW OF SYSTEMS: ROS    Positive for: Gastrointestinal, Endocrine, Eyes, Respiratory   Negative for: Constitutional, Neurological, Skin, Genitourinary, Musculoskeletal, HENT, Cardiovascular, Psychiatric, Allergic/Imm, Heme/Lymph   Last edited by Theodore Demark, COA on 06/30/2020  1:27 PM. (History)       ALLERGIES Allergies  Allergen Reactions   Latex Shortness Of Breath and Rash   Niacin Other (See Comments)    Burning   Other Dermatitis    Powder in the gloves    Tomato Other (See Comments)    Breaks pt out    Bactrim [Sulfamethoxazole-Trimethoprim] Swelling and Rash    PAST MEDICAL HISTORY Past Medical History:  Diagnosis Date   Anemia    Arthritis    Asthma    Cataract    Combined form OU   COPD (chronic obstructive pulmonary disease) ([place])    Diabetes mellitus without complication ([place])    Diabetic retinopathy ([place])    BDR OU   Family history of adverse reaction to anesthesia    sister- difficult to wake up from anesthesia   Fluid excess    GERD (gastroesophageal reflux disease)    Goiter    Hypertension    Hypertensive retinopathy    OU   Renal failure    Past Surgical History:  Procedure Laterality Date   ABDOMINAL HYSTERECTOMY     AV FISTULA PLACEMENT Left 06/11/2020   Procedure: LEFT ARM ARTERIOVENOUS (AV) FISTULA CREATION;  Surgeon: [name], MD;  Location: [facility];  Service: Vascular;  Laterality: Left;   CHOLECYSTECTOMY     COLONOSCOPY N/A 03/25/2020   [place]: two tubular adenomas removed. next colonoscopy five years   POLYPECTOMY  03/25/2020   Procedure: POLYPECTOMY;  Surgeon: Daneil Dolin, MD;  Location: [facility] SUITE;  Service: Endoscopy;;    FAMILY HISTORY Family History  Problem Relation Age of Onset   Kidney disease Mother        ESRD   Kidney disease Sister        ESRD   Colon polyps Brother 67   Kidney disease  Maternal Grandmother        ESRD   Colon cancer Neg Hx     SOCIAL HISTORY Social History   Tobacco Use   Smoking status: Never Smoker   Smokeless tobacco: Never Used  Vaping Use   Vaping Use: Never used  Substance Use Topics   Alcohol use: No   Drug use: No         OPHTHALMIC EXAM:  Base Eye Exam    Visual Acuity (Snellen - Linear)  Right Left   Dist cc 20/25 20/25 -1   Dist ph cc 20/20 20/20   Correction: Glasses       Tonometry (Tonopen, 1:24 PM)      Right Left   Pressure 18 17       Pupils      Dark Light Shape React APD   Right 3 2 Round Brisk None   Left 3 2 Round Brisk None       Visual Fields (Counting fingers)      Left Right    Full Full       Extraocular Movement      Right Left    Full Full       Neuro/Psych    Oriented x3: Yes   Mood/Affect: Normal       Dilation    Both eyes: 1.0% Mydriacyl, 2.5% Phenylephrine @ 1:24 PM        Slit Lamp and Fundus Exam    Slit Lamp Exam      Right Left   Lids/Lashes Dermato, mild MGD Dermarto, mild MGD   Conjunctiva/Sclera Nasal pinguecula, mild melanosis melanosis   Cornea trace PEE, trace arcus trace PEE, trace arcus   Anterior Chamber Deep and quiet Deep and quiet   Iris Round and dilated Round and dilated   Lens 2-3+ NS, 2-3+ cortical 2-3+ NS, 2-3+ cortical   Vitreous Synerisis Synerisis       Fundus Exam      Right Left   Disc Pink, sharp, Compact, focal PPP Pink, sharp, compact   C/D Ratio 0.2 0.1   Macula Blunted foveal reflex, mild rpe mottling, scattered cystic changes, scattered IRH Good foveal reflex, mild rpe mottling, scattered cystic changes, scattered MA   Vessels Mild attenuation and tortuoisty Attenuated, tortuous, mild copper wiring   Periphery Attached; Focal pigmented lattice with atrophic hole at 12 and in IT quad, greatest at 0730 -- good laser surrounding all patches of lattice, minimal MA Attached, Focal pigmented lattice from 0530-0700 -- good laser  surrounding, minimal MA        Refraction    Wearing Rx      Sphere Cylinder Axis Add   Right [place] +0.50 045 +2.00   Left +0.05 +0.50 128 +2.00   Type: PAL          IMAGING AND PROCEDURES  Imaging and Procedures for 06/30/2020  OCT, Retina - OU - Both Eyes       Right Eye Quality was good. Central Foveal Thickness: 288. Progression has been stable. Findings include abnormal foveal contour, intraretinal fluid, no SRF (Focal IRF centrally and IN macula -- stable).   Left Eye Quality was good. Central Foveal Thickness: 271. Progression has been stable. Findings include normal foveal contour, intraretinal fluid, no SRF, vitreomacular adhesion  (Focal cystic changes nasal and superior macula).   Notes *Images captured and stored on drive  Diagnosis / Impression:  Mod NPDR w/ DME OU OD: Focal IRF centrally and IN macula -- stable OS: Focal cystic changes nasal and superior macula  Clinical management:  See below  Abbreviations: NFP - Normal foveal profile. CME - cystoid macular edema. PED - pigment epithelial detachment. IRF - intraretinal fluid. SRF - subretinal fluid. EZ - ellipsoid zone. ERM - epiretinal membrane. ORA - outer retinal atrophy. ORT - outer retinal tubulation. SRHM - subretinal hyper-reflective material. IRHM - intraretinal hyper-reflective material                 ASSESSMENT/PLAN:  ICD-10-CM   1. Moderate nonproliferative diabetic retinopathy of both eyes with macular edema associated with diabetes mellitus due to underlying condition ([place])  G95.6213   2. Retinal edema  H35.81 OCT, Retina - OU - Both Eyes  3. Lattice degeneration of both retinas  H35.413   4. Retinal holes, bilateral  H33.323   5. Essential hypertension  I10   6. Hypertensive retinopathy of both eyes  H35.033   7. Combined forms of age-related cataract of both eyes  H25.813   8. Dry eyes, bilateral  H04.123     1,2. Moderate non-proliferative diabetic retinopathy w/ DME  OU  - Last A1c 6.9 on 12.31.2020 - exam shows scattered cystic changes and scattered MA/IRH  OU - FA 09.15.21 shows late leaking MA OU, no NV OU - OCT shows +DME OU: focal IRF centrally and inf nasal macula OD; Focal cystic changes nasal and superior macula OS - BCVA remains very good -- 20/20 OU - Pt wishes to defer tx at this time -- reasonable - f/u 3 months, DFE, OCT  3,4. Lattice degeneration w/ atrophic holes, OU - patches of inferior lattice OU; OD with small patch at 12 - discussed findings, prognosis, and treatment options including observation - s/p laser retinopexy OD (09.29.21) -- good laser surrounding - s/p laser retinopexy OS (10.13.21) -- good laser surrounidng - complete PF drops - f/u 3 mos  5,6. Hypertensive retinopathy OU - discussed importance of tight BP control - monitor  7. Mixed Cataract OU - The symptoms of cataract, surgical options, and treatments and risks were discussed with patient. - discussed diagnosis and progression - not yet visually significant - monitor for now  8. Dry eyes OU - recommend artificial tears and lubricating ointment as needed   Ophthalmic Meds Ordered this visit:  No orders of the defined types were placed in this encounter.      Return in about 3 months (around 09/30/2020) for f/u 3 months, NPDR OU, DFE, OCT.  There are no Patient Instructions on file for this visit.   Explained the diagnoses, plan, and follow up with the patient and they expressed understanding.  Patient expressed understanding of the importance of proper follow up care.   This document serves as a record of services personally performed by Gardiner Sleeper, MD, PhD. It was created on their behalf by Estill Bakes, COT an ophthalmic technician. The creation of this record is the provider's dictation and/or activities during the visit.    Electronically signed by: Estill Bakes, COT 11.5.21 @ 1:47 AM   This document serves as a record of services  personally performed by Gardiner Sleeper, MD, PhD. It was created on their behalf by [place] Jetty. Owens Shark, OA an ophthalmic technician. The creation of this record is the provider's dictation and/or activities during the visit.    Electronically signed by: San Jetty. Owens Shark, [place] 11.09.2021 1:47 AM  Gardiner Sleeper, M.D., Ph.D. Diseases & Surgery of the Retina and [place] 06/30/2020   I have reviewed the above documentation for accuracy and completeness, and I agree with the above. Gardiner Sleeper, M.D., Ph.D. 07/05/20 1:47 AM   Abbreviations: M myopia (nearsighted); A astigmatism; H hyperopia (farsighted); P presbyopia; Mrx spectacle prescription;  CTL contact lenses; OD right eye; OS left eye; OU both eyes  XT exotropia; ET esotropia; PEK punctate epithelial keratitis; PEE punctate epithelial erosions; DES dry eye syndrome; MGD meibomian gland dysfunction; ATs artificial tears; PFAT's preservative free  artificial tears; [place] nuclear sclerotic cataract; PSC posterior subcapsular cataract; ERM epi-retinal membrane; PVD posterior vitreous detachment; RD retinal detachment; DM diabetes mellitus; DR diabetic retinopathy; NPDR non-proliferative diabetic retinopathy; PDR proliferative diabetic retinopathy; CSME clinically significant macular edema; DME diabetic macular edema; dbh dot blot hemorrhages; CWS cotton wool spot; POAG primary open angle glaucoma; C/D cup-to-disc ratio; HVF humphrey visual field; GVF goldmann visual field; OCT optical coherence tomography; IOP intraocular pressure; BRVO Branch retinal vein occlusion; CRVO central retinal vein occlusion; CRAO central retinal artery occlusion; BRAO branch retinal artery occlusion; RT retinal tear; SB scleral buckle; PPV pars plana vitrectomy; VH Vitreous hemorrhage; PRP panretinal laser photocoagulation; IVK intravitreal kenalog; VMT vitreomacular traction; MH Macular hole;  NVD neovascularization of the disc; NVE  neovascularization elsewhere; AREDS age related eye disease study; ARMD age related macular degeneration; POAG primary open angle glaucoma; EBMD epithelial/anterior basement membrane dystrophy; ACIOL anterior chamber intraocular lens; IOL intraocular lens; PCIOL posterior chamber intraocular lens; Phaco/IOL phacoemulsification with intraocular lens placement; [place] photorefractive keratectomy; LASIK laser assisted in situ keratomileusis; HTN hypertension; DM diabetes mellitus; COPD chronic obstructive pulmonary disease

## 2020-06-29 ENCOUNTER — Ambulatory Visit: Payer: Medicare Other | Admitting: Gastroenterology

## 2020-06-29 ENCOUNTER — Encounter: Payer: Self-pay | Admitting: Gastroenterology

## 2020-06-30 ENCOUNTER — Encounter (INDEPENDENT_AMBULATORY_CARE_PROVIDER_SITE_OTHER): Payer: Self-pay | Admitting: Ophthalmology

## 2020-06-30 ENCOUNTER — Ambulatory Visit (INDEPENDENT_AMBULATORY_CARE_PROVIDER_SITE_OTHER): Payer: Medicare Other | Admitting: Ophthalmology

## 2020-06-30 ENCOUNTER — Other Ambulatory Visit: Payer: Self-pay

## 2020-06-30 DIAGNOSIS — H04123 Dry eye syndrome of bilateral lacrimal glands: Secondary | ICD-10-CM

## 2020-06-30 DIAGNOSIS — I10 Essential (primary) hypertension: Secondary | ICD-10-CM

## 2020-06-30 DIAGNOSIS — H25813 Combined forms of age-related cataract, bilateral: Secondary | ICD-10-CM

## 2020-06-30 DIAGNOSIS — H35413 Lattice degeneration of retina, bilateral: Secondary | ICD-10-CM

## 2020-06-30 DIAGNOSIS — E083313 Diabetes mellitus due to underlying condition with moderate nonproliferative diabetic retinopathy with macular edema, bilateral: Secondary | ICD-10-CM

## 2020-06-30 DIAGNOSIS — H33323 Round hole, bilateral: Secondary | ICD-10-CM

## 2020-06-30 DIAGNOSIS — H3581 Retinal edema: Secondary | ICD-10-CM

## 2020-06-30 DIAGNOSIS — H35033 Hypertensive retinopathy, bilateral: Secondary | ICD-10-CM

## 2020-07-01 ENCOUNTER — Other Ambulatory Visit (HOSPITAL_COMMUNITY): Payer: Medicare Other

## 2020-07-01 ENCOUNTER — Encounter (HOSPITAL_COMMUNITY): Payer: Medicare Other

## 2020-07-01 ENCOUNTER — Encounter (HOSPITAL_COMMUNITY)
Admission: RE | Admit: 2020-07-01 | Discharge: 2020-07-01 | Disposition: A | Discharge status: Home / Self Care | Payer: Medicare Other | Source: Ambulatory Visit | Attending: Nephrology | Admitting: Nephrology

## 2020-07-01 ENCOUNTER — Encounter (HOSPITAL_COMMUNITY): Admission: RE | Admit: 2020-07-01 | Payer: Medicare Other | Source: Ambulatory Visit

## 2020-07-01 ENCOUNTER — Encounter (HOSPITAL_COMMUNITY): Payer: Self-pay

## 2020-07-01 DIAGNOSIS — E211 Secondary hyperparathyroidism, not elsewhere classified: Secondary | ICD-10-CM | POA: Diagnosis not present

## 2020-07-01 DIAGNOSIS — E872 Acidosis: Secondary | ICD-10-CM | POA: Diagnosis not present

## 2020-07-01 DIAGNOSIS — N184 Chronic kidney disease, stage 4 (severe): Secondary | ICD-10-CM | POA: Insufficient documentation

## 2020-07-01 DIAGNOSIS — D631 Anemia in chronic kidney disease: Secondary | ICD-10-CM | POA: Insufficient documentation

## 2020-07-01 DIAGNOSIS — R809 Proteinuria, unspecified: Secondary | ICD-10-CM | POA: Diagnosis not present

## 2020-07-01 DIAGNOSIS — N185 Chronic kidney disease, stage 5: Secondary | ICD-10-CM | POA: Diagnosis not present

## 2020-07-01 LAB — POCT HEMOGLOBIN-HEMACUE: Hemoglobin: 8.5 g/dL — ABNORMAL LOW (ref 12.0–15.0)

## 2020-07-01 MED ORDER — EPOETIN ALFA 3000 UNIT/ML IJ SOLN
3000.0000 [IU] | Freq: Once | INTRAMUSCULAR | Status: AC
  Administered 2020-07-01: 3000 [IU] via SUBCUTANEOUS
  Filled 2020-07-01: qty 1

## 2020-07-02 DIAGNOSIS — E119 Type 2 diabetes mellitus without complications: Secondary | ICD-10-CM | POA: Diagnosis not present

## 2020-07-05 ENCOUNTER — Encounter (INDEPENDENT_AMBULATORY_CARE_PROVIDER_SITE_OTHER): Payer: Self-pay | Admitting: Ophthalmology

## 2020-07-07 DIAGNOSIS — E1122 Type 2 diabetes mellitus with diabetic chronic kidney disease: Secondary | ICD-10-CM | POA: Diagnosis not present

## 2020-07-07 DIAGNOSIS — Z23 Encounter for immunization: Secondary | ICD-10-CM | POA: Diagnosis not present

## 2020-07-07 DIAGNOSIS — I10 Essential (primary) hypertension: Secondary | ICD-10-CM | POA: Diagnosis not present

## 2020-07-07 DIAGNOSIS — Z299 Encounter for prophylactic measures, unspecified: Secondary | ICD-10-CM | POA: Diagnosis not present

## 2020-07-07 DIAGNOSIS — E1165 Type 2 diabetes mellitus with hyperglycemia: Secondary | ICD-10-CM | POA: Diagnosis not present

## 2020-07-07 DIAGNOSIS — L309 Dermatitis, unspecified: Secondary | ICD-10-CM | POA: Diagnosis not present

## 2020-07-15 ENCOUNTER — Encounter (HOSPITAL_COMMUNITY)
Admission: RE | Admit: 2020-07-15 | Discharge: 2020-07-15 | Disposition: A | Discharge status: Home / Self Care | Payer: Medicare Other | Source: Ambulatory Visit | Attending: Nephrology | Admitting: Nephrology

## 2020-07-15 ENCOUNTER — Encounter (HOSPITAL_COMMUNITY): Payer: Self-pay

## 2020-07-15 ENCOUNTER — Other Ambulatory Visit: Payer: Self-pay

## 2020-07-15 DIAGNOSIS — D631 Anemia in chronic kidney disease: Secondary | ICD-10-CM | POA: Diagnosis not present

## 2020-07-15 DIAGNOSIS — N184 Chronic kidney disease, stage 4 (severe): Secondary | ICD-10-CM | POA: Diagnosis not present

## 2020-07-15 LAB — POCT HEMOGLOBIN-HEMACUE: Hemoglobin: 8.5 g/dL — ABNORMAL LOW (ref 12.0–15.0)

## 2020-07-15 MED ORDER — EPOETIN ALFA 3000 UNIT/ML IJ SOLN
3000.0000 [IU] | Freq: Once | INTRAMUSCULAR | Status: AC
  Administered 2020-07-15: 3000 [IU] via SUBCUTANEOUS

## 2020-07-15 MED ORDER — EPOETIN ALFA 3000 UNIT/ML IJ SOLN
INTRAMUSCULAR | Status: AC
  Filled 2020-07-15: qty 1

## 2020-07-20 ENCOUNTER — Ambulatory Visit (INDEPENDENT_AMBULATORY_CARE_PROVIDER_SITE_OTHER): Payer: Self-pay | Admitting: Vascular Surgery

## 2020-07-20 ENCOUNTER — Other Ambulatory Visit: Payer: Self-pay

## 2020-07-20 ENCOUNTER — Encounter: Payer: Self-pay | Admitting: Vascular Surgery

## 2020-07-20 VITALS — BP 159/81 | HR 84 | Temp 98.4°F | Resp 14 | Ht 67.0 in | Wt 225.0 lb

## 2020-07-20 DIAGNOSIS — N185 Chronic kidney disease, stage 5: Secondary | ICD-10-CM

## 2020-07-20 NOTE — Progress Notes (Signed)
[facility]  Patient name: Cindy Park MRN: [id number] DOB: 1956/01/12 Sex: female  REASON FOR VISIT: Follow-up for stage basilic vein fistula on 00/34/9179  HPI: Cindy Park is a 64 y.o. female here today for follow-up.  She underwent outpatient for stage basilic vein transposition fistula at [facility] on 06/11/2020.  She did well and was discharged home.  She did have some serous sanguinous drainage from her incision and presented to our office in [place] several days later for office visit.  This was felt to be normal.  She looks quite good today.  Her incisions are completely healed.  She has no steal symptoms.  Current Outpatient Medications  Medication Sig Dispense Refill  . acetaminophen (TYLENOL) 500 MG tablet Take 1,000 mg by mouth every 6 (six) hours as needed for moderate pain.     Marland Kitchen albuterol (PROVENTIL) (2.5 MG/3ML) 0.083% nebulizer solution Take 2.5 mg by nebulization 4 (four) times daily.     Marland Kitchen albuterol (VENTOLIN HFA) 108 (90 Base) MCG/ACT inhaler Inhale 2 puffs into the lungs every 6 (six) hours as needed for wheezing or shortness of breath.    Marland Kitchen amLODipine (NORVASC) 5 MG tablet Take 5 mg by mouth 2 (two) times daily.    Marland Kitchen aspirin EC 81 MG tablet Take 81 mg by mouth daily.    Marland Kitchen atorvastatin (LIPITOR) 40 MG tablet Take 40 mg by mouth at bedtime.     . calcitRIOL (ROCALTROL) 0.25 MCG capsule Take 0.25 mcg by mouth daily.    . carvedilol (COREG) 25 MG tablet Take by mouth.    . Cholecalciferol (VITAMIN D) 125 MCG (5000 UT) CAPS Take 5,000 Units by mouth in the morning and at bedtime.     . clonazePAM (KLONOPIN) 0.5 MG tablet Take 0.5 mg by mouth daily.    Marland Kitchen dicyclomine (BENTYL) 10 MG capsule Take 10 mg by mouth 2 (two) times daily.     Marland Kitchen epoetin alfa (EPOGEN) 3000 UNIT/ML injection Inject 3,000 Units into the vein every 14 (fourteen) days.    . ferrous sulfate 325 (65 FE) MG tablet Take 325 mg  by mouth 2 (two) times daily with a meal.    . Fluticasone-Umeclidin-Vilant (TRELEGY ELLIPTA) 100-62.5-25 MCG/INH AEPB Inhale 1 puff into the lungs daily.    . furosemide (LASIX) 80 MG tablet Take 80 mg by mouth 2 (two) times daily.     Marland Kitchen gabapentin (NEURONTIN) 300 MG capsule Take 300 mg by mouth 2 (two) times daily.     Marland Kitchen gemfibrozil (LOPID) 600 MG tablet Take 600 mg by mouth 2 (two) times daily before a meal.    . Insulin Lispro Prot & Lispro (HUMALOG MIX 75/25 [place]) Inject 15-60 Units into the skin See admin instructions. Inject 60 units into the skin in the morning and 15 units at lunch and 60 units at supper    . levothyroxine (SYNTHROID) 50 MCG tablet Take 50 mcg by mouth daily before breakfast.     . montelukast (SINGULAIR) 10 MG tablet Take 10 mg by mouth at bedtime.    Marland Kitchen omeprazole (PRILOSEC) 20 MG capsule Take 20 mg by mouth daily.     Marland Kitchen oxyCODONE-acetaminophen (PERCOCET) 5-325 MG tablet Take 1 tablet by mouth every 6 (six) hours as needed for severe pain. 12 tablet 0  . prednisoLONE acetate (PRED FORTE) 1 % ophthalmic suspension Place 1 drop into the left eye 4 (four) times daily.    . sitaGLIPtin (JANUVIA) 25  MG tablet Take 25 mg by mouth daily.    . sodium bicarbonate 650 MG tablet Take 650 mg by mouth 2 (two) times daily.    . vitamin B-12 (CYANOCOBALAMIN) 1000 MCG tablet Take 1,000 mcg by mouth daily.      No current facility-administered medications for this visit.     PHYSICAL EXAM: Vitals:   07/20/20 1349  BP: (!) 159/81  Pulse: 84  Resp: 14  Temp: 98.4 F (36.9 C)  TempSrc: Other (Comment)  SpO2: 98%  Weight: 225 lb (102.1 kg)  Height: 5\' 7"  (1.702 m)    GENERAL: The patient is a well-nourished female, in no acute distress. The vital signs are documented above. Excellent thrill in her upper arm fistula.  Antecubital incision well-healed.  2+ left radial pulse with no steal symptoms  I did image her vein with SonoSite ultrasound and this does show a nice Samuel Rittenhouse  maturation of her basilic vein.  MEDICAL ISSUES: Stable status post for stage basilic vein fistula on 15/94/7076.  Will plan for second stage transposition.  I discussed the procedure with the patient and  her husband.  I did explain that this is also done as an outpatient at [facility] but was a somewhat more involved than simple fistula creation with the actual transposition of the vein.  We will schedule this at her convenience   [name], MD [facility] Tel [phone]

## 2020-07-20 NOTE — H&P (View-Only) (Signed)
[facility]ist of [place]  Patient name: Cindy Park MRN: [id number] DOB: [birth year] Sex: female  REASON FOR VISIT: Follow-up for stage basilic vein fistula on 38/05/1750  HPI: Cindy Park is a 64 y.o. female here today for follow-up.  She underwent outpatient for stage basilic vein transposition fistula at [facility] on 06/11/2020.  She did well and was discharged home.  She did have some serous sanguinous drainage from her incision and presented to our office in [place] several days later for office visit.  This was felt to be normal.  She looks quite good today.  Her incisions are completely healed.  She has no steal symptoms.  Current Outpatient Medications  Medication Sig Dispense Refill  . acetaminophen (TYLENOL) 500 MG tablet Take 1,000 mg by mouth every 6 (six) hours as needed for moderate pain.     Marland Kitchen albuterol (PROVENTIL) (2.5 MG/3ML) 0.083% nebulizer solution Take 2.5 mg by nebulization 4 (four) times daily.     Marland Kitchen albuterol (VENTOLIN HFA) 108 (90 Base) MCG/ACT inhaler Inhale 2 puffs into the lungs every 6 (six) hours as needed for wheezing or shortness of breath.    Marland Kitchen amLODipine (NORVASC) 5 MG tablet Take 5 mg by mouth 2 (two) times daily.    Marland Kitchen aspirin EC 81 MG tablet Take 81 mg by mouth daily.    Marland Kitchen atorvastatin (LIPITOR) 40 MG tablet Take 40 mg by mouth at bedtime.     . calcitRIOL (ROCALTROL) 0.25 MCG capsule Take 0.25 mcg by mouth daily.    . carvedilol (COREG) 25 MG tablet Take by mouth.    . Cholecalciferol (VITAMIN D) 125 MCG (5000 UT) CAPS Take 5,000 Units by mouth in the morning and at bedtime.     . clonazePAM (KLONOPIN) 0.5 MG tablet Take 0.5 mg by mouth daily.    Marland Kitchen dicyclomine (BENTYL) 10 MG capsule Take 10 mg by mouth 2 (two) times daily.     Marland Kitchen epoetin alfa (EPOGEN) 3000 UNIT/ML injection Inject 3,000 Units into the vein every 14 (fourteen) days.    . ferrous sulfate 325 (65 FE) MG tablet Take 325 mg  by mouth 2 (two) times daily with a meal.    . Fluticasone-Umeclidin-Vilant (TRELEGY ELLIPTA) 100-62.5-25 MCG/INH AEPB Inhale 1 puff into the lungs daily.    . furosemide (LASIX) 80 MG tablet Take 80 mg by mouth 2 (two) times daily.     Marland Kitchen gabapentin (NEURONTIN) 300 MG capsule Take 300 mg by mouth 2 (two) times daily.     Marland Kitchen gemfibrozil (LOPID) 600 MG tablet Take 600 mg by mouth 2 (two) times daily before a meal.    . Insulin Lispro Prot & Lispro (HUMALOG MIX 75/25 [place]) Inject 15-60 Units into the skin See admin instructions. Inject 60 units into the skin in the morning and 15 units at lunch and 60 units at supper    . levothyroxine (SYNTHROID) 50 MCG tablet Take 50 mcg by mouth daily before breakfast.     . montelukast (SINGULAIR) 10 MG tablet Take 10 mg by mouth at bedtime.    Marland Kitchen omeprazole (PRILOSEC) 20 MG capsule Take 20 mg by mouth daily.     Marland Kitchen oxyCODONE-acetaminophen (PERCOCET) 5-325 MG tablet Take 1 tablet by mouth every 6 (six) hours as needed for severe pain. 12 tablet 0  . prednisoLONE acetate (PRED FORTE) 1 % ophthalmic suspension Place 1 drop into the left eye 4 (four) times daily.    . sitaGLIPtin (JANUVIA) 25  MG tablet Take 25 mg by mouth daily.    . sodium bicarbonate 650 MG tablet Take 650 mg by mouth 2 (two) times daily.    . vitamin B-12 (CYANOCOBALAMIN) 1000 MCG tablet Take 1,000 mcg by mouth daily.      No current facility-administered medications for this visit.     PHYSICAL EXAM: Vitals:   07/20/20 1349  BP: (!) 159/81  Pulse: 84  Resp: 14  Temp: 98.4 F (36.9 C)  TempSrc: Other (Comment)  SpO2: 98%  Weight: 225 lb (102.1 kg)  Height: 5\' 7"  (1.702 m)    GENERAL: The patient is a well-nourished female, in no acute distress. The vital signs are documented above. Excellent thrill in her upper arm fistula.  Antecubital incision well-healed.  2+ left radial pulse with no steal symptoms  I did image her vein with SonoSite ultrasound and this does show a nice Marisue Canion  maturation of her basilic vein.  MEDICAL ISSUES: Stable status post for stage basilic vein fistula on 82/51/8984.  Will plan for second stage transposition.  I discussed the procedure with the patient and  her husband.  I did explain that this is also done as an outpatient at [facility] but was a somewhat more involved than simple fistula creation with the actual transposition of the vein.  We will schedule this at her convenience   [name], MD [facility] of [facility] Tel [phone]

## 2020-07-21 DIAGNOSIS — J449 Chronic obstructive pulmonary disease, unspecified: Secondary | ICD-10-CM | POA: Diagnosis not present

## 2020-07-21 DIAGNOSIS — I10 Essential (primary) hypertension: Secondary | ICD-10-CM | POA: Diagnosis not present

## 2020-07-21 DIAGNOSIS — E119 Type 2 diabetes mellitus without complications: Secondary | ICD-10-CM | POA: Diagnosis not present

## 2020-07-22 ENCOUNTER — Other Ambulatory Visit: Payer: Self-pay

## 2020-07-29 ENCOUNTER — Other Ambulatory Visit: Payer: Self-pay

## 2020-07-29 ENCOUNTER — Encounter (HOSPITAL_COMMUNITY)
Admission: RE | Admit: 2020-07-29 | Discharge: 2020-07-29 | Disposition: A | Discharge status: Home / Self Care | Payer: Medicare Other | Source: Ambulatory Visit | Attending: Nephrology | Admitting: Nephrology

## 2020-07-29 ENCOUNTER — Encounter (HOSPITAL_COMMUNITY): Payer: Self-pay

## 2020-07-29 DIAGNOSIS — D631 Anemia in chronic kidney disease: Secondary | ICD-10-CM | POA: Insufficient documentation

## 2020-07-29 DIAGNOSIS — N184 Chronic kidney disease, stage 4 (severe): Secondary | ICD-10-CM | POA: Insufficient documentation

## 2020-07-29 LAB — POCT HEMOGLOBIN-HEMACUE: Hemoglobin: 9.7 g/dL — ABNORMAL LOW (ref 12.0–15.0)

## 2020-07-29 MED ORDER — EPOETIN ALFA 3000 UNIT/ML IJ SOLN
3000.0000 [IU] | Freq: Once | INTRAMUSCULAR | Status: AC
  Administered 2020-07-29: 3000 [IU] via SUBCUTANEOUS
  Filled 2020-07-29: qty 1

## 2020-07-31 NOTE — Patient Instructions (Addendum)
Cindy Park  07/31/2020     @PREFPERIOPPHARMACY @   Your procedure is scheduled on  08/06/2020.  Report to [facility] at  [phone]  A.M.  Call this number if you have problems the morning of surgery:  [phone]   Remember:  Do not eat or drink after midnight.                          Take these medicines the morning of surgery with A SIP OF WATER  Amlodipine, carvedilol, clonazepam, gabapentin, levothyroxine, prilosec. Use your nebulizer and your inhaler before you come. Take 30 units of your humalog the night before your procedure. DO NOT take any medications for diabetes the morning of your procedure.    Do not wear jewelry, make-up or nail polish.  Do not wear lotions, powders, or perfumes, or deodorant. Please brush your teeth.  Do not shave 48 hours prior to surgery.  Men may shave face and neck.  Do not bring valuables to the hospital.  [facility] is not responsible for any belongings or valuables.  Contacts, dentures or bridgework may not be worn into surgery.  Leave your suitcase in the car.  After surgery it may be brought to your room.  For patients admitted to the hospital, discharge time will be determined by your treatment team.  Patients discharged the day of surgery will not be allowed to drive home.   Name and phone number of your driver:   family Special instructions:  DO NOT smoke the morning of your procedure  Please read over the following fact sheets that you were given. Anesthesia Post-op Instructions and Care and Recovery After Surgery       AV Fistula Placement, Care After This sheet gives you information about how to care for yourself after your procedure. Your health care provider may also give you more specific instructions. If you have problems or questions, contact your health care provider. What can I expect after the procedure? After the procedure, it is common to:  Feel sore.  Feel a vibration (thrill) over the  fistula. Follow these instructions at home: Incision care      Follow instructions from your health care provider about how to take care of your incision. Make sure you: ? Wash your hands with soap and water before and after you change your bandage (dressing). If soap and water are not available, use hand sanitizer. ? Change your dressing as told by your health care provider. ? Leave stitches (sutures), skin glue, or adhesive strips in place. These skin closures may need to stay in place for 2 weeks or longer. If adhesive strip edges start to loosen and curl up, you may trim the loose edges. Do not remove adhesive strips completely unless your health care provider tells you to do that. Fistula care  Check your fistula site every day to make sure the thrill feels the same.  Check your fistula site every day for signs of infection. Check for: ? Redness, swelling, or pain. ? Fluid or blood. ? Warmth. ? Pus or a bad smell.  Raise (elevate) the affected area above the level of your heart while you are sitting or lying down.  Do not lift anything that is heavier than 10 lb (4.5 kg), or the limit that you are told, until your health care provider says that it is safe.  Do not lie down on your fistula arm.  Do not let anyone draw blood or take a blood pressure reading on your fistula arm. This is important.  Do not wear tight jewelry or clothing over your fistula arm. Bathing  Do not take baths, swim, or use a hot tub until your health care provider approves. Ask your health care provider if you may take showers. You may only be allowed to take sponge baths.  Keep the area around your incision clean and dry. Medicines  Take over-the-counter and prescription medicines only as told by your health care provider.  Ask your health care provider if any medicine prescribed to you can cause constipation. You may need to take steps to prevent or treat constipation, such as: ? Drink enough  fluid to keep your urine pale yellow. ? Take over-the-counter or prescription medicines. ? Eat foods that are high in fiber, such as beans, whole grains, and fresh fruits and vegetables. ? Limit foods that are high in fat and processed sugars, such as fried or sweet foods. General instructions  Rest at home for a day or two.  Return to your normal activities as told by your health care provider. Ask your health care provider what activities are safe for you.  Keep all follow-up visits as told by your health care provider. This is important. Contact a health care provider if:  You have redness, swelling, or pain around your fistula site.  Your fistula site feels warm to the touch.  You have pus or a bad smell coming from your fistula site.  You have a fever.  You have numbness or coldness at your fistula site.  You feel a decrease or a change in the thrill. Get help right away if you:  Are bleeding from your fistula site.  Have chest pain.  Have trouble breathing. Summary  Follow instructions from your health care provider about how to take care of your incision.  Do not let anyone draw blood or take a blood pressure reading on your fistula arm. This is important.  Return to your normal activities as told by your health care provider. Ask your health care provider what activities are safe for you.  Contact a health care provider if you have a change in the thrill or have any signs of infection at your fistula site.  Keep all follow-up visits as told by your health care provider. This is important. This information is not intended to replace advice given to you by your health care provider. Make sure you discuss any questions you have with your health care provider. Document Revised: 01/25/2019 Document Reviewed: 02/12/2018 Elsevier Patient Education  [place]. How to Use Chlorhexidine for Bathing Chlorhexidine gluconate (CHG) is a germ-killing (antiseptic)  solution that is used to clean the skin. It can get rid of the bacteria that normally live on the skin and can keep them away for about 24 hours. To clean your skin with CHG, you may be given:  A CHG solution to use in the shower or as part of a sponge bath.  A prepackaged cloth that contains CHG. Cleaning your skin with CHG may help lower the risk for infection:  While you are staying in the intensive care unit of the hospital.  If you have a vascular access, such as a central line, to provide short-term or long-term access to your veins.  If you have a catheter to drain urine from your bladder.  If you are on a ventilator. A ventilator is a machine that helps  you breathe by moving air in and out of your lungs.  After surgery. What are the risks? Risks of using CHG include:  A skin reaction.  Hearing loss, if CHG gets in your ears.  Eye injury, if CHG gets in your eyes and is not rinsed out.  The CHG product catching fire. Make sure that you avoid smoking and flames after applying CHG to your skin. Do not use CHG:  If you have a chlorhexidine allergy or have previously reacted to chlorhexidine.  On babies younger than 77 months of age. How to use CHG solution  Use CHG only as told by your health care provider, and follow the instructions on the label.  Use the full amount of CHG as directed. Usually, this is one bottle. During a shower Follow these steps when using CHG solution during a shower (unless your health care provider gives you different instructions): 1. Start the shower. 2. Use your normal soap and shampoo to wash your face and hair. 3. Turn off the shower or move out of the shower stream. 4. Pour the CHG onto a clean washcloth. Do not use any type of brush or rough-edged sponge. 5. Starting at your neck, lather your body down to your toes. Make sure you follow these instructions: ? If you will be having surgery, pay special attention to the part of your body  where you will be having surgery. Scrub this area for at least 1 minute. ? Do not use CHG on your head or face. If the solution gets into your ears or eyes, rinse them well with water. ? Avoid your genital area. ? Avoid any areas of skin that have broken skin, cuts, or scrapes. ? Scrub your back and under your arms. Make sure to wash skin folds. 6. Let the lather sit on your skin for 1-2 minutes or as long as told by your health care provider. 7. Thoroughly rinse your entire body in the shower. Make sure that all body creases and crevices are rinsed well. 8. Dry off with a clean towel. Do not put any substances on your body afterward-such as powder, lotion, or perfume-unless you are told to do so by your health care provider. Only use lotions that are recommended by the manufacturer. 9. Put on clean clothes or pajamas. 10. If it is the night before your surgery, sleep in clean sheets.  During a sponge bath Follow these steps when using CHG solution during a sponge bath (unless your health care provider gives you different instructions): 1. Use your normal soap and shampoo to wash your face and hair. 2. Pour the CHG onto a clean washcloth. 3. Starting at your neck, lather your body down to your toes. Make sure you follow these instructions: ? If you will be having surgery, pay special attention to the part of your body where you will be having surgery. Scrub this area for at least 1 minute. ? Do not use CHG on your head or face. If the solution gets into your ears or eyes, rinse them well with water. ? Avoid your genital area. ? Avoid any areas of skin that have broken skin, cuts, or scrapes. ? Scrub your back and under your arms. Make sure to wash skin folds. 4. Let the lather sit on your skin for 1-2 minutes or as long as told by your health care provider. 5. Using a different clean, wet washcloth, thoroughly rinse your entire body. Make sure that all body creases and crevices  are rinsed  well. 6. Dry off with a clean towel. Do not put any substances on your body afterward-such as powder, lotion, or perfume-unless you are told to do so by your health care provider. Only use lotions that are recommended by the manufacturer. 7. Put on clean clothes or pajamas. 8. If it is the night before your surgery, sleep in clean sheets. How to use CHG prepackaged cloths  Only use CHG cloths as told by your health care provider, and follow the instructions on the label.  Use the CHG cloth on clean, dry skin.  Do not use the CHG cloth on your head or face unless your health care provider tells you to.  When washing with the CHG cloth: ? Avoid your genital area. ? Avoid any areas of skin that have broken skin, cuts, or scrapes. Before surgery Follow these steps when using a CHG cloth to clean before surgery (unless your health care provider gives you different instructions): 1. Using the CHG cloth, vigorously scrub the part of your body where you will be having surgery. Scrub using a back-and-forth motion for 3 minutes. The area on your body should be completely wet with CHG when you are done scrubbing. 2. Do not rinse. Discard the cloth and let the area air-dry. Do not put any substances on the area afterward, such as powder, lotion, or perfume. 3. Put on clean clothes or pajamas. 4. If it is the night before your surgery, sleep in clean sheets.  For general bathing Follow these steps when using CHG cloths for general bathing (unless your health care provider gives you different instructions). 1. Use a separate CHG cloth for each area of your body. Make sure you wash between any folds of skin and between your fingers and toes. Wash your body in the following order, switching to a new cloth after each step: ? The front of your neck, shoulders, and chest. ? Both of your arms, under your arms, and your hands. ? Your stomach and groin area, avoiding the genitals. ? Your right leg and  foot. ? Your left leg and foot. ? The back of your neck, your back, and your buttocks. 2. Do not rinse. Discard the cloth and let the area air-dry. Do not put any substances on your body afterward-such as powder, lotion, or perfume-unless you are told to do so by your health care provider. Only use lotions that are recommended by the manufacturer. 3. Put on clean clothes or pajamas. Contact a health care provider if:  Your skin gets irritated after scrubbing.  You have questions about using your solution or cloth. Get help right away if:  Your eyes become very red or swollen.  Your eyes itch badly.  Your skin itches badly and is red or swollen.  Your hearing changes.  You have trouble seeing.  You have swelling or tingling in your mouth or throat.  You have trouble breathing.  You swallow any chlorhexidine. Summary  Chlorhexidine gluconate (CHG) is a germ-killing (antiseptic) solution that is used to clean the skin. Cleaning your skin with CHG may help to lower your risk for infection.  You may be given CHG to use for bathing. It may be in a bottle or in a prepackaged cloth to use on your skin. Carefully follow your health care provider's instructions and the instructions on the product label.  Do not use CHG if you have a chlorhexidine allergy.  Contact your health care provider if your skin gets irritated  after scrubbing. This information is not intended to replace advice given to you by your health care provider. Make sure you discuss any questions you have with your health care provider. Document Revised: 10/25/2018 Document Reviewed: 07/06/2017 Elsevier Patient Education  [place].

## 2020-08-04 ENCOUNTER — Other Ambulatory Visit (HOSPITAL_COMMUNITY)
Admission: RE | Admit: 2020-08-04 | Discharge: 2020-08-04 | Disposition: A | Discharge status: Home / Self Care | Payer: Medicare Other | Source: Ambulatory Visit | Attending: Vascular Surgery | Admitting: Vascular Surgery

## 2020-08-04 ENCOUNTER — Encounter (HOSPITAL_COMMUNITY)
Admission: RE | Admit: 2020-08-04 | Discharge: 2020-08-04 | Disposition: A | Discharge status: Home / Self Care | Payer: Medicare Other | Source: Ambulatory Visit | Attending: Vascular Surgery | Admitting: Vascular Surgery

## 2020-08-04 ENCOUNTER — Encounter (HOSPITAL_COMMUNITY): Payer: Self-pay

## 2020-08-04 ENCOUNTER — Other Ambulatory Visit: Payer: Self-pay

## 2020-08-04 DIAGNOSIS — Z01812 Encounter for preprocedural laboratory examination: Secondary | ICD-10-CM | POA: Insufficient documentation

## 2020-08-04 DIAGNOSIS — Z20822 Contact with and (suspected) exposure to covid-19: Secondary | ICD-10-CM | POA: Insufficient documentation

## 2020-08-04 LAB — CBC WITH DIFFERENTIAL/PLATELET
Abs Immature Granulocytes: 0.06 10*3/uL (ref 0.00–0.07)
Basophils Absolute: 0 10*3/uL (ref 0.0–0.1)
Basophils Relative: 0 %
Eosinophils Absolute: 0.8 10*3/uL — ABNORMAL HIGH (ref 0.0–0.5)
Eosinophils Relative: 10 %
HCT: 28 % — ABNORMAL LOW (ref 36.0–46.0)
Hemoglobin: 8.7 g/dL — ABNORMAL LOW (ref 12.0–15.0)
Immature Granulocytes: 1 %
Lymphocytes Relative: 20 %
Lymphs Abs: 1.6 10*3/uL (ref 0.7–4.0)
MCH: 27.3 pg (ref 26.0–34.0)
MCHC: 31.1 g/dL (ref 30.0–36.0)
MCV: 87.8 fL (ref 80.0–100.0)
Monocytes Absolute: 0.5 10*3/uL (ref 0.1–1.0)
Monocytes Relative: 6 %
Neutro Abs: 5.1 10*3/uL (ref 1.7–7.7)
Neutrophils Relative %: 63 %
Platelets: 282 10*3/uL (ref 150–400)
RBC: 3.19 MIL/uL — ABNORMAL LOW (ref 3.87–5.11)
RDW: 13.9 % (ref 11.5–15.5)
WBC: 8.1 10*3/uL (ref 4.0–10.5)
nRBC: 0 % (ref 0.0–0.2)

## 2020-08-04 LAB — BASIC METABOLIC PANEL
Anion gap: 11 (ref 5–15)
BUN: 48 mg/dL — ABNORMAL HIGH (ref 8–23)
CO2: 20 mmol/L — ABNORMAL LOW (ref 22–32)
Calcium: 8.9 mg/dL (ref 8.9–10.3)
Chloride: 110 mmol/L (ref 98–111)
Creatinine, Ser: 4.59 mg/dL — ABNORMAL HIGH (ref 0.44–1.00)
GFR, Estimated: 10 mL/min — ABNORMAL LOW (ref >60)
Glucose, Bld: 132 mg/dL — ABNORMAL HIGH (ref 70–99)
Potassium: 3.6 mmol/L (ref 3.5–5.1)
Sodium: 141 mmol/L (ref 135–145)

## 2020-08-04 LAB — SARS CORONAVIRUS 2 (TAT 6-24 HRS): SARS Coronavirus 2: NEGATIVE

## 2020-08-06 ENCOUNTER — Other Ambulatory Visit: Payer: Self-pay | Admitting: *Deleted

## 2020-08-06 ENCOUNTER — Ambulatory Visit (HOSPITAL_COMMUNITY): Payer: Medicare Other

## 2020-08-06 ENCOUNTER — Ambulatory Visit (HOSPITAL_COMMUNITY)
Admission: RE | Admit: 2020-08-06 | Discharge: 2020-08-06 | Disposition: A | Discharge status: Home / Self Care | Payer: Medicare Other | Attending: Vascular Surgery | Admitting: Vascular Surgery

## 2020-08-06 ENCOUNTER — Encounter (HOSPITAL_COMMUNITY)
Admission: RE | Disposition: A | Discharge status: Home / Self Care | Payer: Self-pay | Source: Home / Self Care | Attending: Vascular Surgery

## 2020-08-06 DIAGNOSIS — I10 Essential (primary) hypertension: Secondary | ICD-10-CM | POA: Diagnosis not present

## 2020-08-06 DIAGNOSIS — N185 Chronic kidney disease, stage 5: Secondary | ICD-10-CM

## 2020-08-06 DIAGNOSIS — Z7982 Long term (current) use of aspirin: Secondary | ICD-10-CM | POA: Insufficient documentation

## 2020-08-06 DIAGNOSIS — J45909 Unspecified asthma, uncomplicated: Secondary | ICD-10-CM | POA: Diagnosis not present

## 2020-08-06 DIAGNOSIS — Z794 Long term (current) use of insulin: Secondary | ICD-10-CM | POA: Insufficient documentation

## 2020-08-06 DIAGNOSIS — N186 End stage renal disease: Secondary | ICD-10-CM | POA: Diagnosis not present

## 2020-08-06 DIAGNOSIS — E119 Type 2 diabetes mellitus without complications: Secondary | ICD-10-CM | POA: Diagnosis not present

## 2020-08-06 DIAGNOSIS — Z79890 Hormone replacement therapy (postmenopausal): Secondary | ICD-10-CM | POA: Diagnosis not present

## 2020-08-06 DIAGNOSIS — N183 Chronic kidney disease, stage 3 unspecified: Secondary | ICD-10-CM

## 2020-08-06 DIAGNOSIS — Z79899 Other long term (current) drug therapy: Secondary | ICD-10-CM | POA: Diagnosis not present

## 2020-08-06 DIAGNOSIS — J449 Chronic obstructive pulmonary disease, unspecified: Secondary | ICD-10-CM | POA: Diagnosis not present

## 2020-08-06 HISTORY — PX: BASCILIC VEIN TRANSPOSITION: SHX5742

## 2020-08-06 LAB — GLUCOSE, CAPILLARY
Glucose-Capillary: 103 mg/dL — ABNORMAL HIGH (ref 70–99)
Glucose-Capillary: 146 mg/dL — ABNORMAL HIGH (ref 70–99)

## 2020-08-06 LAB — POCT I-STAT, CHEM 8
BUN: 52 mg/dL — ABNORMAL HIGH (ref 8–23)
Calcium, Ion: 1.06 mmol/L — ABNORMAL LOW (ref 1.15–1.40)
Chloride: 108 mmol/L (ref 98–111)
Creatinine, Ser: 4.9 mg/dL — ABNORMAL HIGH (ref 0.44–1.00)
Glucose, Bld: 110 mg/dL — ABNORMAL HIGH (ref 70–99)
HCT: 27 % — ABNORMAL LOW (ref 36.0–46.0)
Hemoglobin: 9.2 g/dL — ABNORMAL LOW (ref 12.0–15.0)
Potassium: 4 mmol/L (ref 3.5–5.1)
Sodium: 138 mmol/L (ref 135–145)
TCO2: 20 mmol/L — ABNORMAL LOW (ref 22–32)

## 2020-08-06 SURGERY — TRANSPOSITION, VEIN, BASILIC
Anesthesia: General | Site: Arm Upper | Laterality: Left

## 2020-08-06 MED ORDER — FENTANYL CITRATE (PF) 100 MCG/2ML IJ SOLN
INTRAMUSCULAR | Status: AC
  Filled 2020-08-06: qty 2

## 2020-08-06 MED ORDER — ONDANSETRON HCL 4 MG/2ML IJ SOLN
INTRAMUSCULAR | Status: AC
  Filled 2020-08-06: qty 2

## 2020-08-06 MED ORDER — CEFAZOLIN SODIUM-DEXTROSE 2-4 GM/100ML-% IV SOLN
2.0000 g | INTRAVENOUS | Status: AC
  Administered 2020-08-06: 2 g via INTRAVENOUS

## 2020-08-06 MED ORDER — PROPOFOL 10 MG/ML IV BOLUS
INTRAVENOUS | Status: DC | PRN
Start: 2020-08-06 — End: 2020-08-06
  Administered 2020-08-06 (×2): 40 mg via INTRAVENOUS
  Administered 2020-08-06: 120 mg via INTRAVENOUS

## 2020-08-06 MED ORDER — CHLORHEXIDINE GLUCONATE 4 % EX LIQD: 60.0000 mL | Freq: Once | CUTANEOUS | Status: DC

## 2020-08-06 MED ORDER — LIDOCAINE HCL (CARDIAC) PF 100 MG/5ML IV SOSY
PREFILLED_SYRINGE | INTRAVENOUS | Status: DC | PRN
  Administered 2020-08-06: 60 mg via INTRAVENOUS

## 2020-08-06 MED ORDER — DEXAMETHASONE SODIUM PHOSPHATE 10 MG/ML IJ SOLN
INTRAMUSCULAR | Status: AC
  Filled 2020-08-06: qty 1

## 2020-08-06 MED ORDER — CHLORHEXIDINE GLUCONATE 0.12 % MT SOLN
OROMUCOSAL | Status: AC
  Filled 2020-08-06: qty 15

## 2020-08-06 MED ORDER — FENTANYL CITRATE (PF) 100 MCG/2ML IJ SOLN
INTRAMUSCULAR | Status: DC | PRN
  Administered 2020-08-06 (×2): 25 ug via INTRAVENOUS
  Administered 2020-08-06: 50 ug via INTRAVENOUS
  Administered 2020-08-06 (×2): 25 ug via INTRAVENOUS

## 2020-08-06 MED ORDER — SODIUM CHLORIDE 0.9 % IV SOLN: INTRAVENOUS | Status: DC

## 2020-08-06 MED ORDER — OXYCODONE-ACETAMINOPHEN 5-325 MG PO TABS: 1.0000 | ORAL_TABLET | ORAL | 0 refills | Status: DC | PRN

## 2020-08-06 MED ORDER — PHENYLEPHRINE HCL (PRESSORS) 10 MG/ML IV SOLN
INTRAVENOUS | Status: AC
  Filled 2020-08-06: qty 1

## 2020-08-06 MED ORDER — LIDOCAINE-EPINEPHRINE 0.5 %-1:200000 IJ SOLN
INTRAMUSCULAR | Status: AC
  Filled 2020-08-06: qty 1

## 2020-08-06 MED ORDER — CEFAZOLIN SODIUM-DEXTROSE 2-4 GM/100ML-% IV SOLN
INTRAVENOUS | Status: AC
  Filled 2020-08-06: qty 100

## 2020-08-06 MED ORDER — CHLORHEXIDINE GLUCONATE 0.12 % MT SOLN
15.0000 mL | Freq: Once | OROMUCOSAL | Status: AC
  Administered 2020-08-06: 15 mL via OROMUCOSAL

## 2020-08-06 MED ORDER — HEPARIN SODIUM (PORCINE) 1000 UNIT/ML IJ SOLN
INTRAMUSCULAR | Status: AC
  Filled 2020-08-06: qty 6

## 2020-08-06 MED ORDER — SODIUM CHLORIDE 0.9 % IV SOLN
INTRAVENOUS | Status: DC | PRN
  Administered 2020-08-06: 500 mL

## 2020-08-06 MED ORDER — 0.9 % SODIUM CHLORIDE (POUR BTL) OPTIME
TOPICAL | Status: DC | PRN
  Administered 2020-08-06: 1000 mL

## 2020-08-06 MED ORDER — EPHEDRINE 5 MG/ML INJ
INTRAVENOUS | Status: AC
  Filled 2020-08-06: qty 10

## 2020-08-06 MED ORDER — ALBUTEROL SULFATE HFA 108 (90 BASE) MCG/ACT IN AERS
INHALATION_SPRAY | RESPIRATORY_TRACT | Status: DC | PRN
  Administered 2020-08-06: 4 via RESPIRATORY_TRACT

## 2020-08-06 MED ORDER — ORAL CARE MOUTH RINSE: 15.0000 mL | Freq: Once | OROMUCOSAL | Status: AC

## 2020-08-06 MED ORDER — ONDANSETRON HCL 4 MG/2ML IJ SOLN: 4.0000 mg | Freq: Once | INTRAMUSCULAR | Status: DC | PRN

## 2020-08-06 MED ORDER — LIDOCAINE-EPINEPHRINE 0.5 %-1:200000 IJ SOLN
INTRAMUSCULAR | Status: DC | PRN
  Administered 2020-08-06: 20 mL

## 2020-08-06 MED ORDER — DEXAMETHASONE SODIUM PHOSPHATE 10 MG/ML IJ SOLN
INTRAMUSCULAR | Status: DC | PRN
  Administered 2020-08-06: 5 mg via INTRAVENOUS

## 2020-08-06 MED ORDER — LIDOCAINE HCL (PF) 2 % IJ SOLN
INTRAMUSCULAR | Status: AC
  Filled 2020-08-06: qty 5

## 2020-08-06 MED ORDER — FENTANYL CITRATE (PF) 100 MCG/2ML IJ SOLN
25.0000 ug | INTRAMUSCULAR | Status: DC | PRN
  Administered 2020-08-06: 50 ug via INTRAVENOUS
  Filled 2020-08-06: qty 2

## 2020-08-06 MED ORDER — PROPOFOL 10 MG/ML IV BOLUS
INTRAVENOUS | Status: AC
  Filled 2020-08-06: qty 40

## 2020-08-06 MED ORDER — EPHEDRINE SULFATE 50 MG/ML IJ SOLN
INTRAMUSCULAR | Status: DC | PRN
  Administered 2020-08-06 (×3): 5 mg via INTRAVENOUS

## 2020-08-06 MED ORDER — ONDANSETRON HCL 4 MG/2ML IJ SOLN
INTRAMUSCULAR | Status: DC | PRN
  Administered 2020-08-06: 4 mg via INTRAVENOUS

## 2020-08-06 SURGICAL SUPPLY — 43 items
ADH SKN CLS APL DERMABOND .7 (GAUZE/BANDAGES/DRESSINGS) ×2
ARMBAND PINK RESTRICT EXTREMIT (MISCELLANEOUS) ×2 IMPLANT
BAG HAMPER (MISCELLANEOUS) ×4 IMPLANT
BNDG ELASTIC 4X5.8 VLCR STR LF (GAUZE/BANDAGES/DRESSINGS) ×2 IMPLANT
CANNULA VESSEL 3MM 2 BLNT TIP (CANNULA) ×2 IMPLANT
CLIP LIGATING EXTRA MED SLVR (CLIP) ×4 IMPLANT
CLIP LIGATING EXTRA SM BLUE (MISCELLANEOUS) ×2 IMPLANT
COVER LIGHT HANDLE STERIS (MISCELLANEOUS) ×8 IMPLANT
COVER MAYO STAND XLG (MISCELLANEOUS) ×2 IMPLANT
COVER PROBE W GEL 5X96 (DRAPES) ×2 IMPLANT
COVER WAND RF STERILE (DRAPES) ×2 IMPLANT
DECANTER SPIKE VIAL GLASS SM (MISCELLANEOUS) ×2 IMPLANT
DERMABOND ADVANCED (GAUZE/BANDAGES/DRESSINGS) ×2
DERMABOND ADVANCED .7 DNX12 (GAUZE/BANDAGES/DRESSINGS) ×2 IMPLANT
ELECT REM PT RETURN 9FT ADLT (ELECTROSURGICAL) ×2
ELECTRODE REM PT RTRN 9FT ADLT (ELECTROSURGICAL) ×1 IMPLANT
GAUZE SPONGE 4X4 12PLY STRL (GAUZE/BANDAGES/DRESSINGS) ×2 IMPLANT
GLOVE BIOGEL PI IND STRL 7.0 (GLOVE) ×4 IMPLANT
GLOVE BIOGEL PI INDICATOR 7.0 (GLOVE) ×4
GLOVE SKINSENSE NS SZ7.5 (GLOVE) ×1
GLOVE SKINSENSE STRL SZ7.5 (GLOVE) ×1 IMPLANT
GLOVE SURG SS PI 6.5 STRL IVOR (GLOVE) ×4 IMPLANT
GLOVE SURG SS PI 7.0 STRL IVOR (GLOVE) ×2 IMPLANT
GOWN STRL REUS W/TWL LRG LVL3 (GOWN DISPOSABLE) ×8 IMPLANT
KIT BLADEGUARD II DBL (SET/KITS/TRAYS/PACK) ×2 IMPLANT
KIT TURNOVER KIT A (KITS) ×2 IMPLANT
MANIFOLD NEPTUNE II (INSTRUMENTS) ×4 IMPLANT
MARKER SKIN DUAL TIP RULER LAB (MISCELLANEOUS) ×4 IMPLANT
NEEDLE HYPO 18GX1.5 BLUNT FILL (NEEDLE) ×2 IMPLANT
NS IRRIG 1000ML POUR BTL (IV SOLUTION) ×2 IMPLANT
PACK CV ACCESS (CUSTOM PROCEDURE TRAY) ×2 IMPLANT
PAD ARMBOARD 7.5X6 YLW CONV (MISCELLANEOUS) ×4 IMPLANT
SET BASIN LINEN APH (SET/KITS/TRAYS/PACK) ×2 IMPLANT
SPONGE LAP 18X18 RF (DISPOSABLE) ×2 IMPLANT
SUT PROLENE 6 0 CC (SUTURE) ×2 IMPLANT
SUT SILK 2 0 SH (SUTURE) ×2 IMPLANT
SUT SILK 3 0 (SUTURE) ×2
SUT SILK 3-0 18XBRD TIE 12 (SUTURE) ×1 IMPLANT
SUT VIC AB 3-0 SH 27 (SUTURE) ×4
SUT VIC AB 3-0 SH 27X BRD (SUTURE) ×2 IMPLANT
SYR 20ML LL LF (SYRINGE) ×2 IMPLANT
SYR CONTROL 10ML LL (SYRINGE) ×2 IMPLANT
WATER STERILE IRR 1000ML POUR (IV SOLUTION) ×2 IMPLANT

## 2020-08-06 NOTE — Interval H&P Note (Signed)
History and Physical Interval Note:  08/06/2020 7:21 AM  Cindy Park Cindy Park  has presented today for surgery, with the diagnosis of CHRONIC KIDNEY DISEASE STAGE V.  The various methods of treatment have been discussed with the patient and family. After consideration of risks, benefits and other options for treatment, the patient has consented to  Procedure(s): LEFT ARM SECOND STAGE [place] (Left) as a surgical intervention.  The patient's history has been reviewed, patient examined, no change in status, stable for surgery.  I have reviewed the patient's chart and labs.  Questions were answered to the patient's satisfaction.     Curt Jews

## 2020-08-06 NOTE — Op Note (Signed)
    OPERATIVE REPORT  DATE OF SURGERY: 08/06/2020  PATIENT: Cindy Park, 65 y.o. female MRN: [id number]  DOB: May 29, 1956  PRE-OPERATIVE DIAGNOSIS: Chronic renal insufficiency  POST-OPERATIVE DIAGNOSIS:  Same  PROCEDURE: Left second stage brachiobasilic fistula transposition  SURGEON:  Curt Jews, M.D.  PHYSICIAN ASSISTANT: Nurse  The assistant was needed for exposure and to expedite the case  ANESTHESIA: LMA  EBL: per anesthesia record  Total I/O In: 600 [I.V.:500; IV Piggyback:100] Out: 50 [Blood:50]  BLOOD ADMINISTERED: none  DRAINS: none  SPECIMEN: none  COUNTS CORRECT:  YES  PATIENT DISPOSITION:  PACU - hemodynamically stable  PROCEDURE DETAILS: Patient was taken room placed supine position where the area of the left arm was prepped draped you sterile fashion.  SonoSite ultrasound was used to mark the basilic vein which was good caliber throughout its course.  Incision was made over the prior brachial artery anastomosis and carried in [place] the vein at the brachial artery anastomosis.  The vein was of good caliber at this location.  2 separate incisions were made one in the mid upper arm and one towards the axilla and the basilic vein was mobilized circumferentially from the antecubital space to the axilla.  Multiple tributary branches were ligated with 2-0 and 3-0 silk ties and divided.  The vein was occluded near the brachial artery anastomosis and the vein was transected.  The vein was marked to reduce risk of twisting.  A separate subcutaneous tunnel was created from the antecubital space to the axilla over the mid upper arm.  The vein was brought through this tunnel.  The vein was cut to the appropriate length and was spatulated and sewn end-to-side to the artery with a running 6-0 Prolene suture.  Clamps removed and excellent thrill was noted.  Wounds irrigated with saline.  Hemostasis electrocautery.  Wounds were closed with 3-0 Vicryl in the subcutaneous and  subcuticular tissue.  Sterile dressing was applied and the patient was transferred to the recovery room in stable condition   [name], M.D., [facility] 08/06/2020 10:41 AM

## 2020-08-06 NOTE — Anesthesia Procedure Notes (Signed)
Procedure Name: LMA Insertion Date/Time: 08/06/2020 7:39 AM Performed by: Karna Dupes, CRNA Pre-anesthesia Checklist: Patient identified, Emergency Drugs available, Suction available and Patient being monitored Patient Re-evaluated:Patient Re-evaluated prior to induction Oxygen Delivery Method: Circle system utilized Preoxygenation: Pre-oxygenation with 100% oxygen Induction Type: IV induction Ventilation: Mask ventilation without difficulty LMA: LMA inserted LMA Size: 4.0 Number of attempts: 1 Tube secured with: Tape Dental Injury: Teeth and Oropharynx as per pre-operative assessment

## 2020-08-06 NOTE — Anesthesia Preprocedure Evaluation (Signed)
Anesthesia Evaluation  Patient identified by MRN, date of birth, ID band Patient awake    Reviewed: Allergy & Precautions, H&P , NPO status , Patient's Chart, lab work & pertinent test results, reviewed documented beta blocker date and time   Airway Mallampati: II  TM Distance: >3 FB Neck ROM: full    Dental no notable dental hx.    Pulmonary asthma , COPD,    Pulmonary exam normal breath sounds clear to auscultation       Cardiovascular Exercise Tolerance: Good hypertension, negative cardio ROS   Rhythm:regular Rate:Normal     Neuro/Psych negative neurological ROS  negative psych ROS   GI/Hepatic Neg liver ROS, GERD  Medicated,  Endo/Other  negative endocrine ROSdiabetes  Renal/GU CRF and ESRFRenal disease  negative genitourinary   Musculoskeletal   Abdominal   Peds  Hematology  (+) Blood dyscrasia, anemia ,   Anesthesia Other Findings   Reproductive/Obstetrics negative OB ROS                             Anesthesia Physical  Anesthesia Plan  ASA: III  Anesthesia Plan: General   Post-op Pain Management:    Induction:   PONV Risk Score and Plan: Ondansetron  Airway Management Planned:   Additional Equipment:   Intra-op Plan:   Post-operative Plan:   Informed Consent: I have reviewed the patients History and Physical, chart, labs and discussed the procedure including the risks, benefits and alternatives for the proposed anesthesia with the patient or authorized representative who has indicated his/her understanding and acceptance.     Dental Advisory Given  Plan Discussed with: CRNA  Anesthesia Plan Comments:         Anesthesia Quick Evaluation

## 2020-08-06 NOTE — Discharge Instructions (Signed)
Wound Care, Adult Taking care of your wound properly can help to prevent pain, infection, and scarring. It can also help your wound to heal more quickly. How to care for your wound Wound care      Follow instructions from your health care provider about how to take care of your wound. Make sure you: ? Wash your hands with soap and water before you change the bandage (dressing). If soap and water are not available, use hand sanitizer. ? Change your dressing as told by your health care provider. ? Leave stitches (sutures), skin glue, or adhesive strips in place. These skin closures may need to stay in place for 2 weeks or longer. If adhesive strip edges start to loosen and curl up, you may trim the loose edges. Do not remove adhesive strips completely unless your health care provider tells you to do that.  Check your wound area every day for signs of infection. Check for: ? Redness, swelling, or pain. ? Fluid or blood. ? Warmth. ? Pus or a bad smell.  Ask your health care provider if you should clean the wound with mild soap and water. Doing this may include: ? Using a clean towel to pat the wound dry after cleaning it. Do not rub or scrub the wound. ? Applying a cream or ointment. Do this only as told by your health care provider. ? Covering the incision with a clean dressing.  Ask your health care provider when you can leave the wound uncovered.  Keep the dressing dry until your health care provider says it can be removed. Do not take baths, swim, use a hot tub, or do anything that would put the wound underwater until your health care provider approves. Ask your health care provider if you can take showers. You may only be allowed to take sponge baths. Medicines   If you were prescribed an antibiotic medicine, cream, or ointment, take or use the antibiotic as told by your health care provider. Do not stop taking or using the antibiotic even if your condition improves.  Take  over-the-counter and prescription medicines only as told by your health care provider. If you were prescribed pain medicine, take it 30 or more minutes before you do any wound care or as told by your health care provider. General instructions  Return to your normal activities as told by your health care provider. Ask your health care provider what activities are safe.  Do not scratch or pick at the wound.  Do not use any products that contain nicotine or tobacco, such as cigarettes and e-cigarettes. These may delay wound healing. If you need help quitting, ask your health care provider.  Keep all follow-up visits as told by your health care provider. This is important.  Eat a diet that includes protein, vitamin A, vitamin C, and other nutrient-rich foods to help the wound heal. ? Foods rich in protein include meat, dairy, beans, nuts, and other sources. ? Foods rich in vitamin A include carrots and dark green, leafy vegetables. ? Foods rich in vitamin C include citrus, tomatoes, and other fruits and vegetables. ? Nutrient-rich foods have protein, carbohydrates, fat, vitamins, or minerals. Eat a variety of healthy foods including vegetables, fruits, and whole grains. Contact a health care provider if:  You received a tetanus shot and you have swelling, severe pain, redness, or bleeding at the injection site.  Your pain is not controlled with medicine.  You have redness, swelling, or pain around the wound.  You have fluid or blood coming from the wound.  Your wound feels warm to the touch.  You have pus or a bad smell coming from the wound.  You have a fever or chills.  You are nauseous or you vomit.  You are dizzy. Get help right away if:  You have a red streak going away from your wound.  The edges of the wound open up and separate.  Your wound is bleeding, and the bleeding does not stop with gentle pressure.  You have a rash.  You faint.  You have trouble  breathing. Summary  Always wash your hands with soap and water before changing your bandage (dressing).  To help with healing, eat foods that are rich in protein, vitamin A, vitamin C, and other nutrients.  Check your wound every day for signs of infection. Contact your health care provider if you suspect that your wound is infected. This information is not intended to replace advice given to you by your health care provider. Make sure you discuss any questions you have with your health care provider. Document Revised: 11/26/2018 Document Reviewed: 02/23/2016 Elsevier Patient Education  [place] These instructions provide you with information about caring for yourself after your procedure. Your health care provider may also give you more specific instructions. Your treatment has been planned according to current medical practices, but problems sometimes occur. Call your health care provider if you have any problems or questions after your procedure. What can I expect after the procedure? After your procedure, you may:  Feel sleepy for several hours.  Feel clumsy and have poor balance for several hours.  Feel forgetful about what happened after the procedure.  Have poor judgment for several hours.  Feel nauseous or vomit.  Have a sore throat if you had a breathing tube during the procedure. Follow these instructions at home: For at least 24 hours after the procedure:      Have a responsible adult stay with you. It is important to have someone help care for you until you are awake and alert.  Rest as needed.  Do not: ? Participate in activities in which you could fall or become injured. ? Drive. ? Use heavy machinery. ? Drink alcohol. ? Take sleeping pills or medicines that cause drowsiness. ? Make important decisions or sign legal documents. ? Take care of children on your own. Eating and drinking  Follow the diet  that is recommended by your health care provider.  If you vomit, drink water, juice, or soup when you can drink without vomiting.  Make sure you have little or no nausea before eating solid foods. General instructions  Take over-the-counter and prescription medicines only as told by your health care provider.  If you have sleep apnea, surgery and certain medicines can increase your risk for breathing problems. Follow instructions from your health care provider about wearing your sleep device: ? Anytime you are sleeping, including during daytime naps. ? While taking prescription pain medicines, sleeping medicines, or medicines that make you drowsy.  If you smoke, do not smoke without supervision.  Keep all follow-up visits as told by your health care provider. This is important. Contact a health care provider if:  You keep feeling nauseous or you keep vomiting.  You feel light-headed.  You develop a rash.  You have a fever. Get help right away if:  You have trouble breathing. Summary  For several hours after your  procedure, you may feel sleepy and have poor judgment.  Have a responsible adult stay with you for at least 24 hours or until you are awake and alert. This information is not intended to replace advice given to you by your health care provider. Make sure you discuss any questions you have with your health care provider. Document Revised: 11/06/2017 Document Reviewed: 11/29/2015 Elsevier Patient Education  [place].    Acetaminophen; Oxycodone tablets What is this medicine? ACETAMINOPHEN; OXYCODONE (a set a MEE noe fen; ox i KOE done) is a pain reliever. It is used to treat moderate to severe pain. This medicine may be used for other purposes; ask your health care provider or pharmacist if you have questions. COMMON BRAND NAME(S): Endocet, Magnacet, Nalocet, Narvox, Percocet, Perloxx, Primalev, Primlev, Prolate, Roxicet, Xolox What should I tell my health  care provider before I take this medicine? They need to know if you have any of these conditions:  brain tumor  Crohn's disease, inflammatory bowel disease, or ulcerative colitis  drug abuse or addiction  head injury  heart or circulation problems  if you often drink alcohol  kidney disease or problems going to the bathroom  liver disease  lung disease, asthma, or breathing problems  an unusual or allergic reaction to acetaminophen, oxycodone, other opioid analgesics, other medicines, foods, dyes, or preservatives  pregnant or trying to get pregnant  breast-feeding How should I use this medicine? Take this medicine by mouth with a full glass of water. Follow the directions on the prescription label. You can take it with or without food. If it upsets your stomach, take it with food. Take your medicine at regular intervals. Do not take it more often than directed. A special MedGuide will be given to you by the pharmacist with each prescription and refill. Be sure to read this information carefully each time. Talk to your pediatrician regarding the use of this medicine in children. Special care may be needed. Overdosage: If you think you have taken too much of this medicine contact a poison control center or emergency room at once. NOTE: This medicine is only for you. Do not share this medicine with others. What if I miss a dose? If you miss a dose, take it as soon as you can. If it is almost time for your next dose, take only that dose. Do not take double or extra doses. What may interact with this medicine? This medicine may interact with the following medications:  alcohol  antihistamines for allergy, cough and cold  antiviral medicines for HIV or AIDS  atropine  certain antibiotics like clarithromycin, erythromycin, linezolid, rifampin  certain medicines for anxiety or sleep  certain medicines for bladder problems like oxybutynin, tolterodine  certain medicines for  depression like amitriptyline, fluoxetine, sertraline  certain medicines for fungal infections like ketoconazole, itraconazole, voriconazole  certain medicines for migraine headache like almotriptan, eletriptan, frovatriptan, naratriptan, rizatriptan, sumatriptan, zolmitriptan  certain medicines for nausea or vomiting like dolasetron, ondansetron, palonosetron  certain medicines for Parkinson's disease like benztropine, trihexyphenidyl  certain medicines for seizures like phenobarbital, phenytoin, primidone  certain medicines for stomach problems like dicyclomine, hyoscyamine  certain medicines for travel sickness like scopolamine  diuretics  general anesthetics like halothane, isoflurane, methoxyflurane, propofol  ipratropium  local anesthetics like lidocaine, pramoxine, tetracaine  MAOIs like Carbex, Eldepryl, Marplan, Nardil, and Parnate  medicines that relax muscles for surgery  methylene blue  nilotinib  other medicines with acetaminophen  other narcotic medicines for pain or  cough  phenothiazines like chlorpromazine, mesoridazine, prochlorperazine, thioridazine This list may not describe all possible interactions. Give your health care provider a list of all the medicines, herbs, non-prescription drugs, or dietary supplements you use. Also tell them if you smoke, drink alcohol, or use illegal drugs. Some items may interact with your medicine. What should I watch for while using this medicine? Tell your health care provider if your pain does not go away, if it gets worse, or if you have new or a different type of pain. You may develop tolerance to this drug. Tolerance means that you will need a higher dose of the drug for pain relief. Tolerance is normal and is expected if you take this drug for a long time. There are different types of narcotic drugs (opioids) for pain. If you take more than one type at the same time, you may have more side effects. Give your health care  provider a list of all drugs you use. He or she will tell you how much drug to take. Do not take more drug than directed. Get emergency help right away if you have problems breathing. Do not suddenly stop taking your drug because you may develop a severe reaction. Your body becomes used to the drug. This does NOT mean you are addicted. Addiction is a behavior related to getting and using a drug for a nonmedical reason. If you have pain, you have a medical reason to take pain drug. Your health care provider will tell you how much drug to take. If your health care provider wants you to stop the drug, the dose will be slowly lowered over time to avoid any side effects. Talk to your health care provider about naloxone and how to get it. Naloxone is an emergency drug used for an opioid overdose. An overdose can happen if you take too much opioid. It can also happen if an opioid is taken with some other drugs or substances, like alcohol. Know the symptoms of an overdose, like trouble breathing, unusually tired or sleepy, or not being able to respond or wake up. Make sure to tell caregivers and close contacts where it is stored. Make sure they know how to use it. After naloxone is given, you must get emergency help right away. Naloxone is a temporary treatment. Repeat doses may be needed. Do not take other drugs that contain acetaminophen with this drug. Many non-prescription drugs contain acetaminophen. Always read labels carefully. If you have questions, ask your health care provider. If you take too much acetaminophen, get medical help right away. Too much acetaminophen can be very dangerous and cause liver damage. Even if you do not have symptoms, it is important to get help right away. This drug does not prevent a heart attack or stroke. This drug may increase the chance of a heart attack or stroke. The chance may increase the longer you use this drug or if you have heart disease. If you take aspirin to prevent a  heart attack or stroke, talk to your health care provider about using this drug. You may get drowsy or dizzy. Do not drive, use machinery, or do anything that needs mental alertness until you know how this drug affects you. Do not stand up or sit up quickly, especially if you are an older patient. This reduces the risk of dizzy or fainting spells. Alcohol may interfere with the effect of this drug. Avoid alcoholic drinks. This drug will cause constipation. If you do not have a bowel  movement for 3 days, call your health care provider. Your mouth may get dry. Chewing sugarless gum or sucking hard candy and drinking plenty of water may help. Contact your health care provider if the problem does not go away or is severe. What side effects may I notice from receiving this medicine? Side effects that you should report to your doctor or health care professional as soon as possible:  allergic reactions like skin rash, itching or hives, swelling of the face, lips, or tongue  breathing problems  confusion  redness, blistering, peeling or loosening of the skin, including inside the mouth  signs and symptoms of liver injury like dark yellow or brown urine; general ill feeling or flu-like symptoms; light-colored stools; loss of appetite; nausea; right upper belly pain; unusually weak or tired; yellowing of the eyes or skin  signs and symptoms of low blood pressure like dizziness; feeling faint or lightheaded, falls; unusually weak or tired  trouble passing urine or change in the amount of urine Side effects that usually do not require medical attention (report to your doctor or health care professional if they continue or are bothersome):  constipation  dry mouth  nausea, vomiting  tiredness This list may not describe all possible side effects. Call your doctor for medical advice about side effects. You may report side effects to FDA at 1-800-FDA-1088. Where should I keep my medicine? Keep out of  the reach of children. This medicine can be abused. Keep your medicine in a safe place to protect it from theft. Do not share this medicine with anyone. Selling or giving away this medicine is dangerous and against the law. Store at room temperature between 20 and 25 degrees C (68 and 77 degrees F). This medicine may cause harm and death if it is taken by other adults, children, or pets. Return medicine that has not been used to an official disposal site. Contact the DEA at 332-562-2713 or your city/county government to find a site. If you cannot return the medicine, flush it down the toilet. Do not use the medicine after the expiration date. NOTE: This sheet is a summary. It may not cover all possible information. If you have questions about this medicine, talk to your doctor, pharmacist, or health care provider.  2020 Elsevier/Gold Standard (2019-03-19 12:25:25)

## 2020-08-06 NOTE — Anesthesia Postprocedure Evaluation (Signed)
Anesthesia Post Note  Patient: Cindy Park  Procedure(s) Performed: LEFT ARM SECOND STAGE BASCILIC VEIN TRANSPOSITION (Left Arm Upper)  Patient location during evaluation: Phase II Anesthesia Type: General Level of consciousness: awake Pain management: pain level controlled Vital Signs Assessment: post-procedure vital signs reviewed and stable Respiratory status: spontaneous breathing Cardiovascular status: blood pressure returned to baseline Postop Assessment: no headache Anesthetic complications: no   No complications documented.   Last Vitals:  Vitals:   08/06/20 1247 08/06/20 1258  BP: 129/61 139/61  Pulse: 78 76  Resp: 19 18  Temp:  36.7 C  SpO2: 99% 98%    Last Pain:  Vitals:   08/06/20 1258  TempSrc: Oral  PainSc: [place]

## 2020-08-06 NOTE — Transfer of Care (Signed)
Immediate Anesthesia Transfer of Care Note  Patient: Cindy Park  Procedure(s) Performed: LEFT ARM SECOND STAGE [place] (Left Arm Upper)  Patient Location: PACU  Anesthesia Type:General  Level of Consciousness: awake and alert   Airway & Oxygen Therapy: Patient Spontanous Breathing and Patient connected to nasal cannula oxygen  Post-op Assessment: Report given to RN and Post -op Vital signs reviewed and stable  Post vital signs: Reviewed and stable  Last Vitals:  Vitals Value Taken Time  BP 123/67   Temp    Pulse 80 08/06/20 1052  Resp 18 08/06/20 1052  SpO2 98 % 08/06/20 1052  Vitals shown include unvalidated device data.  Last Pain:  Vitals:   08/06/20 0710  PainSc: 0-No pain         Complications: No complications documented.

## 2020-08-07 ENCOUNTER — Encounter (HOSPITAL_COMMUNITY): Payer: Self-pay | Admitting: Vascular Surgery

## 2020-08-10 ENCOUNTER — Telehealth: Payer: Self-pay

## 2020-08-10 NOTE — Telephone Encounter (Signed)
Patient called s/p BVT 12/16 - still having 10 out of 10 pain. Denies color or temperature changes and numbness. Discussed with PA will put patient on schedule for tomorrow. Called back, UTR or leave VM.

## 2020-08-10 NOTE — Telephone Encounter (Signed)
Patient contacted [place] back, she is on schedule for tomorrow

## 2020-08-11 ENCOUNTER — Other Ambulatory Visit: Payer: Self-pay

## 2020-08-11 ENCOUNTER — Ambulatory Visit (INDEPENDENT_AMBULATORY_CARE_PROVIDER_SITE_OTHER): Payer: Self-pay | Admitting: Physician Assistant

## 2020-08-11 VITALS — BP 143/74 | HR 76 | Temp 98.8°F | Resp 20 | Ht 67.0 in | Wt 229.9 lb

## 2020-08-11 DIAGNOSIS — E049 Nontoxic goiter, unspecified: Secondary | ICD-10-CM | POA: Insufficient documentation

## 2020-08-11 DIAGNOSIS — E7800 Pure hypercholesterolemia, unspecified: Secondary | ICD-10-CM | POA: Insufficient documentation

## 2020-08-11 DIAGNOSIS — N2581 Secondary hyperparathyroidism of renal origin: Secondary | ICD-10-CM | POA: Insufficient documentation

## 2020-08-11 DIAGNOSIS — N185 Chronic kidney disease, stage 5: Secondary | ICD-10-CM

## 2020-08-11 DIAGNOSIS — N189 Chronic kidney disease, unspecified: Secondary | ICD-10-CM | POA: Insufficient documentation

## 2020-08-11 DIAGNOSIS — G609 Hereditary and idiopathic neuropathy, unspecified: Secondary | ICD-10-CM | POA: Insufficient documentation

## 2020-08-11 DIAGNOSIS — E1165 Type 2 diabetes mellitus with hyperglycemia: Secondary | ICD-10-CM | POA: Insufficient documentation

## 2020-08-11 MED ORDER — OXYCODONE-ACETAMINOPHEN 5-325 MG PO TABS
1.0000 | ORAL_TABLET | Freq: Four times a day (QID) | ORAL | 0 refills | Status: AC | PRN
Start: 2020-08-11 — End: 2020-08-16

## 2020-08-11 NOTE — Progress Notes (Signed)
POST OPERATIVE OFFICE NOTE    CC:  F/u for surgery  HPI:  This is a 64 y.o. female who is s/p left 2nd stage BVT on 08/06/2020 by Dr. Donnetta Hutching.  She had a 1st stage BVT also by Dr. Donnetta Hutching on 06/11/2020.     She comes in today with complaints of 10/10 pain in the left upper arm.  An ace wrap was placed at the hospital on the upper arm and she has not removed it.   She also has swelling in the lower arm onto the hand.  She denies any pain or numbness in her hand.  The pt is not yet on dialysis.  Her nephroogist is Dr. Theador Hawthorne in [place].    Allergies  Allergen Reactions   Latex Shortness Of Breath and Rash    With powder    Niacin Other (See Comments)    Burning   Other Dermatitis    Powder in the gloves    Tomato Other (See Comments)    Breaks pt out    Bactrim [Sulfamethoxazole-Trimethoprim] Swelling and Rash    Current Outpatient Medications  Medication Sig Dispense Refill   acetaminophen (TYLENOL) 500 MG tablet Take 1,000 mg by mouth every 6 (six) hours as needed for moderate pain.      albuterol (PROVENTIL) (2.5 MG/3ML) 0.083% nebulizer solution Take 2.5 mg by nebulization 4 (four) times daily.      albuterol (VENTOLIN HFA) 108 (90 Base) MCG/ACT inhaler Inhale 2 puffs into the lungs every 6 (six) hours as needed for wheezing or shortness of breath.     amLODipine (NORVASC) 5 MG tablet Take 10 mg by mouth 2 (two) times daily.      aspirin EC 81 MG tablet Take 81 mg by mouth daily.     atorvastatin (LIPITOR) 40 MG tablet Take 40 mg by mouth at bedtime.      calcitRIOL (ROCALTROL) 0.25 MCG capsule Take 0.25 mcg by mouth daily.     carvedilol (COREG) 25 MG tablet Take 25 mg by mouth 2 (two) times daily with a meal.      Cholecalciferol (VITAMIN D) 125 MCG (5000 UT) CAPS Take 5,000 Units by mouth in the morning and at bedtime.      clonazePAM (KLONOPIN) 0.5 MG tablet Take 0.5 mg by mouth daily as needed for anxiety.      dicyclomine (BENTYL) 10 MG capsule Take 10  mg by mouth 2 (two) times daily.      epoetin alfa (EPOGEN) 3000 UNIT/ML injection Inject 3,000 Units into the skin every 14 (fourteen) days.      ferrous sulfate 325 (65 FE) MG tablet Take 325 mg by mouth 2 (two) times daily with a meal.     Fluticasone-Umeclidin-Vilant (TRELEGY ELLIPTA) 100-62.5-25 MCG/INH AEPB Inhale 1 puff into the lungs daily.     furosemide (LASIX) 80 MG tablet Take 160 mg by mouth 2 (two) times daily.      gabapentin (NEURONTIN) 300 MG capsule Take 600 mg by mouth 2 (two) times daily.      gemfibrozil (LOPID) 600 MG tablet Take 600 mg by mouth 2 (two) times daily before a meal.     Insulin Lispro Prot & Lispro (HUMALOG MIX 75/25 [place]) Inject 15-60 Units into the skin See admin instructions. Inject 60 units into the skin in the morning and 15 units at lunch and 60 units at supper     levothyroxine (SYNTHROID) 50 MCG tablet Take 50 mcg by mouth daily before breakfast.  montelukast (SINGULAIR) 10 MG tablet Take 10 mg by mouth at bedtime.     omeprazole (PRILOSEC) 20 MG capsule Take 20 mg by mouth daily.      oxyCODONE-acetaminophen (PERCOCET) 5-325 MG tablet Take 1 tablet by mouth every 4 (four) hours as needed for severe pain. 15 tablet 0   sitaGLIPtin (JANUVIA) 25 MG tablet Take 25 mg by mouth daily.     sodium bicarbonate 650 MG tablet Take 650 mg by mouth 2 (two) times daily.     triamcinolone (KENALOG) 0.1 % Apply 1 application topically daily. To feet     vitamin B-12 (CYANOCOBALAMIN) 1000 MCG tablet Take 1,000 mcg by mouth daily.      vitamin E 180 MG (400 UNITS) capsule Take 400 Units by mouth daily.     No current facility-administered medications for this visit.     ROS:  See HPI  Physical Exam:  Today's Vitals   08/11/20 1322  BP: (!) 143/74  Pulse: 76  Resp: 20  Temp: 98.8 F (37.1 C)  TempSrc: Temporal  SpO2: 97%  Weight: 229 lb 14.4 oz (104.3 kg)  Height: 5\' 7"  (1.702 m)  PainSc: 10-Worst pain ever  PainLoc: Arm   Body mass  index is 36.01 kg/m.   Incision:  All incisions are clean and dry Extremities:   There is a palpable left radial pulse.   Motor and sensory are in tact and hand grips are equal bilaterally There is a thrill present.  The fistula is easily palpable There is swelling in the upper arm most likely some hematoma had settled and she has some ecchymosis over the tunneled area.  She also has swelling in the distal forearm and hand.   Assessment/Plan:  This is a 64 y.o. female who is s/p: 2nd stage left BVT on 08/06/2020 by Dr. Donnetta Hutching & first stage BVT on 06/11/2020  -the pt does not have evidence of steal. -she does have some swelling in the upper arm with ecchymosis most likely settled hematoma from tunneling.  She did have an ace wrap on the upper am that had been there since surgery and had not been removed and this is most likely the reason for the swelling in the forearm and hand- I advised her to keep her arm elevated -pt has follow up with Dr. Donnetta Hutching in [place] on 09/15/2019 with duplex.  Would hold using fistula until she is seen back by Dr. Donnetta Hutching.    Leontine Locket, [facility] [phone]  Clinic MD:  Carlis Abbott

## 2020-08-12 ENCOUNTER — Encounter (HOSPITAL_COMMUNITY)
Admission: RE | Admit: 2020-08-12 | Discharge: 2020-08-12 | Disposition: A | Discharge status: Home / Self Care | Payer: Medicare Other | Source: Ambulatory Visit | Attending: Nephrology | Admitting: Nephrology

## 2020-08-12 ENCOUNTER — Encounter (HOSPITAL_COMMUNITY): Payer: Self-pay

## 2020-08-12 ENCOUNTER — Ambulatory Visit: Payer: Medicare Other | Admitting: Gastroenterology

## 2020-08-12 DIAGNOSIS — D631 Anemia in chronic kidney disease: Secondary | ICD-10-CM | POA: Diagnosis not present

## 2020-08-12 DIAGNOSIS — N184 Chronic kidney disease, stage 4 (severe): Secondary | ICD-10-CM | POA: Diagnosis not present

## 2020-08-12 LAB — CBC
HCT: 26.8 % — ABNORMAL LOW (ref 36.0–46.0)
Hemoglobin: 8.4 g/dL — ABNORMAL LOW (ref 12.0–15.0)
MCH: 27.3 pg (ref 26.0–34.0)
MCHC: 31.3 g/dL (ref 30.0–36.0)
MCV: 87 fL (ref 80.0–100.0)
Platelets: 286 10*3/uL (ref 150–400)
RBC: 3.08 MIL/uL — ABNORMAL LOW (ref 3.87–5.11)
RDW: 13.4 % (ref 11.5–15.5)
WBC: 8.9 10*3/uL (ref 4.0–10.5)
nRBC: 0 % (ref 0.0–0.2)

## 2020-08-12 LAB — RENAL FUNCTION PANEL
Albumin: 3.2 g/dL — ABNORMAL LOW (ref 3.5–5.0)
Anion gap: 13 (ref 5–15)
BUN: 55 mg/dL — ABNORMAL HIGH (ref 8–23)
CO2: 21 mmol/L — ABNORMAL LOW (ref 22–32)
Calcium: 8.8 mg/dL — ABNORMAL LOW (ref 8.9–10.3)
Chloride: 102 mmol/L (ref 98–111)
Creatinine, Ser: 4.62 mg/dL — ABNORMAL HIGH (ref 0.44–1.00)
GFR, Estimated: 10 mL/min — ABNORMAL LOW (ref >60)
Glucose, Bld: 214 mg/dL — ABNORMAL HIGH (ref 70–99)
Phosphorus: 4.7 mg/dL — ABNORMAL HIGH (ref 2.5–4.6)
Potassium: 4.2 mmol/L (ref 3.5–5.1)
Sodium: 136 mmol/L (ref 135–145)

## 2020-08-12 LAB — POCT HEMOGLOBIN-HEMACUE: Hemoglobin: 8.3 g/dL — ABNORMAL LOW (ref 12.0–15.0)

## 2020-08-12 LAB — PROTEIN / CREATININE RATIO, URINE
Creatinine, Urine: 75.43 mg/dL
Protein Creatinine Ratio: 4.52 mg/mg{Cre} — ABNORMAL HIGH (ref 0.00–0.15)
Total Protein, Urine: 341 mg/dL

## 2020-08-12 MED ORDER — EPOETIN ALFA 3000 UNIT/ML IJ SOLN
3000.0000 [IU] | Freq: Once | INTRAMUSCULAR | Status: AC
  Administered 2020-08-12: 3000 [IU] via SUBCUTANEOUS
  Filled 2020-08-12: qty 1

## 2020-08-20 DIAGNOSIS — J449 Chronic obstructive pulmonary disease, unspecified: Secondary | ICD-10-CM | POA: Diagnosis not present

## 2020-08-20 DIAGNOSIS — E119 Type 2 diabetes mellitus without complications: Secondary | ICD-10-CM | POA: Diagnosis not present

## 2020-08-20 DIAGNOSIS — I10 Essential (primary) hypertension: Secondary | ICD-10-CM | POA: Diagnosis not present

## 2020-08-26 ENCOUNTER — Encounter (HOSPITAL_COMMUNITY): Payer: Self-pay

## 2020-08-26 ENCOUNTER — Other Ambulatory Visit: Payer: Self-pay

## 2020-08-26 ENCOUNTER — Encounter (HOSPITAL_COMMUNITY)
Admission: RE | Admit: 2020-08-26 | Discharge: 2020-08-26 | Disposition: A | Discharge status: Home / Self Care | Payer: Medicare Other | Source: Ambulatory Visit | Attending: Nephrology | Admitting: Nephrology

## 2020-08-26 ENCOUNTER — Telehealth: Payer: Self-pay

## 2020-08-26 ENCOUNTER — Other Ambulatory Visit: Payer: Self-pay | Admitting: Physician Assistant

## 2020-08-26 DIAGNOSIS — D631 Anemia in chronic kidney disease: Secondary | ICD-10-CM | POA: Insufficient documentation

## 2020-08-26 DIAGNOSIS — N184 Chronic kidney disease, stage 4 (severe): Secondary | ICD-10-CM | POA: Diagnosis not present

## 2020-08-26 LAB — POCT HEMOGLOBIN-HEMACUE: Hemoglobin: 8 g/dL — ABNORMAL LOW (ref 12.0–15.0)

## 2020-08-26 MED ORDER — OXYCODONE-ACETAMINOPHEN 5-325 MG PO TABS: 1.0000 | ORAL_TABLET | Freq: Four times a day (QID) | ORAL | 0 refills | Status: DC | PRN

## 2020-08-26 MED ORDER — EPOETIN ALFA 3000 UNIT/ML IJ SOLN
3000.0000 [IU] | Freq: Once | INTRAMUSCULAR | Status: AC
  Administered 2020-08-26: 3000 [IU] via SUBCUTANEOUS
  Filled 2020-08-26: qty 1

## 2020-08-26 NOTE — Telephone Encounter (Signed)
Patient called c/o 7-8/10 pain at her BVT incision sites in her left arm. Says it is sometimes sharp, sometimes throbbing. Says there is a small amount of numbness on her lower arm and Tylenol was not helping. Discussed with PA, and sent in pain med refill. Advised if she is still having pain after this refill we would need to see her before prescribing more.

## 2020-08-27 DIAGNOSIS — E1122 Type 2 diabetes mellitus with diabetic chronic kidney disease: Secondary | ICD-10-CM | POA: Diagnosis not present

## 2020-08-27 DIAGNOSIS — I129 Hypertensive chronic kidney disease with stage 1 through stage 4 chronic kidney disease, or unspecified chronic kidney disease: Secondary | ICD-10-CM | POA: Diagnosis not present

## 2020-08-27 DIAGNOSIS — R809 Proteinuria, unspecified: Secondary | ICD-10-CM | POA: Diagnosis not present

## 2020-08-27 DIAGNOSIS — D631 Anemia in chronic kidney disease: Secondary | ICD-10-CM | POA: Diagnosis not present

## 2020-08-27 DIAGNOSIS — E211 Secondary hyperparathyroidism, not elsewhere classified: Secondary | ICD-10-CM | POA: Diagnosis not present

## 2020-08-27 DIAGNOSIS — L03114 Cellulitis of left upper limb: Secondary | ICD-10-CM | POA: Diagnosis not present

## 2020-08-27 DIAGNOSIS — N185 Chronic kidney disease, stage 5: Secondary | ICD-10-CM | POA: Diagnosis not present

## 2020-08-27 DIAGNOSIS — E1129 Type 2 diabetes mellitus with other diabetic kidney complication: Secondary | ICD-10-CM | POA: Diagnosis not present

## 2020-08-27 DIAGNOSIS — N189 Chronic kidney disease, unspecified: Secondary | ICD-10-CM | POA: Diagnosis not present

## 2020-08-27 DIAGNOSIS — E872 Acidosis: Secondary | ICD-10-CM | POA: Diagnosis not present

## 2020-08-31 ENCOUNTER — Other Ambulatory Visit: Payer: Self-pay

## 2020-08-31 ENCOUNTER — Encounter: Payer: Self-pay | Admitting: Vascular Surgery

## 2020-08-31 ENCOUNTER — Encounter: Payer: Medicare Other | Admitting: Vascular Surgery

## 2020-08-31 ENCOUNTER — Ambulatory Visit (INDEPENDENT_AMBULATORY_CARE_PROVIDER_SITE_OTHER): Payer: Self-pay | Admitting: Vascular Surgery

## 2020-08-31 VITALS — BP 167/81 | HR 83 | Temp 98.3°F | Resp 16 | Ht 67.0 in | Wt 232.0 lb

## 2020-08-31 DIAGNOSIS — N185 Chronic kidney disease, stage 5: Secondary | ICD-10-CM

## 2020-08-31 NOTE — Progress Notes (Signed)
[facility] [place]  Patient name: Cindy Park MRN: [id number] DOB: 03/22/1956 Sex: female  REASON FOR VISIT: Follow-up left second stage basilic vein transposition  HPI: Cindy Park is a 65 y.o. female here today for follow-up.  She has chronic renal insufficiency.  She underwent for stage brachiobasilic fistula on 93/23/5573 and then transposition on 08/06/2020.  She saw Dr.Bhutani in his office and was concerned regarding erythema.  We got her in today, couple weeks earlier than her scheduled follow-up to rule out any difficulty.  She is having some mild discomfort associated with this.  No steal symptoms.  Current Outpatient Medications  Medication Sig Dispense Refill  . acetaminophen (TYLENOL) 500 MG tablet Take 1,000 mg by mouth every 6 (six) hours as needed for moderate pain.     Marland Kitchen albuterol (PROVENTIL) (2.5 MG/3ML) 0.083% nebulizer solution Take 2.5 mg by nebulization 4 (four) times daily.     Marland Kitchen albuterol (VENTOLIN HFA) 108 (90 Base) MCG/ACT inhaler Inhale 2 puffs into the lungs every 6 (six) hours as needed for wheezing or shortness of breath.    Marland Kitchen amLODipine (NORVASC) 5 MG tablet Take 10 mg by mouth 2 (two) times daily.     Marland Kitchen aspirin EC 81 MG tablet Take 81 mg by mouth daily.    Marland Kitchen atorvastatin (LIPITOR) 40 MG tablet Take 40 mg by mouth at bedtime.     . calcitRIOL (ROCALTROL) 0.25 MCG capsule Take 0.25 mcg by mouth daily.    . carvedilol (COREG) 25 MG tablet Take 25 mg by mouth 2 (two) times daily with a meal.     . cephALEXin (KEFLEX) 500 MG capsule Take by mouth.    . Cholecalciferol (VITAMIN D) 125 MCG (5000 UT) CAPS Take 5,000 Units by mouth in the morning and at bedtime.     . clonazePAM (KLONOPIN) 0.5 MG tablet Take 0.5 mg by mouth daily as needed for anxiety.     . dicyclomine (BENTYL) 10 MG capsule Take 10 mg by mouth 2 (two) times daily.     Marland Kitchen doxycycline (VIBRAMYCIN) 100 MG capsule Take by mouth.    Marland Kitchen epoetin  alfa (EPOGEN) 3000 UNIT/ML injection Inject 3,000 Units into the skin every 14 (fourteen) days.     . ferrous sulfate 325 (65 FE) MG tablet Take 325 mg by mouth 2 (two) times daily with a meal.    . Fluticasone-Umeclidin-Vilant (TRELEGY ELLIPTA) 100-62.5-25 MCG/INH AEPB Inhale 1 puff into the lungs daily.    . furosemide (LASIX) 80 MG tablet Take 160 mg by mouth 2 (two) times daily.     Marland Kitchen gabapentin (NEURONTIN) 300 MG capsule Take 600 mg by mouth 2 (two) times daily.     Marland Kitchen gemfibrozil (LOPID) 600 MG tablet Take 600 mg by mouth 2 (two) times daily before a meal.    . Insulin Lispro Prot & Lispro (HUMALOG MIX 75/25 [place]) Inject 15-60 Units into the skin See admin instructions. Inject 60 units into the skin in the morning and 15 units at lunch and 60 units at supper    . levothyroxine (SYNTHROID) 50 MCG tablet Take 50 mcg by mouth daily before breakfast.     . montelukast (SINGULAIR) 10 MG tablet Take 10 mg by mouth at bedtime.    Marland Kitchen omeprazole (PRILOSEC) 20 MG capsule Take 20 mg by mouth daily.     Marland Kitchen oxyCODONE-acetaminophen (PERCOCET) 5-325 MG tablet Take 1 tablet by mouth every 6 (six) hours as needed for severe pain.  10 tablet 0  . sitaGLIPtin (JANUVIA) 25 MG tablet Take 25 mg by mouth daily.    . sodium bicarbonate 650 MG tablet Take 650 mg by mouth 2 (two) times daily.    Marland Kitchen triamcinolone (KENALOG) 0.1 % Apply 1 application topically daily. To feet    . vitamin B-12 (CYANOCOBALAMIN) 1000 MCG tablet Take 1,000 mcg by mouth daily.     . vitamin E 180 MG (400 UNITS) capsule Take 400 Units by mouth daily.     No current facility-administered medications for this visit.     PHYSICAL EXAM: Vitals:   08/31/20 1256  BP: (!) 167/81  Pulse: 83  Resp: 16  Temp: 98.3 F (36.8 C)  TempSrc: Oral  SpO2: 97%  Weight: 232 lb (105.2 kg)  Height: 5\' 7"  (1.702 m)    GENERAL: The patient is a well-nourished female, in no acute distress. The vital signs are documented above. She has an excellent thrill  in her upper arm fistula.  She has had resolution of the erythema.  All incisions are healing without difficulty  MEDICAL ISSUES: Stable with very nice result from second stage basilic vein transposition.  She has a very large caliber basilic vein and a very superficial location.  I do not see any evidence of wound issues.  She will continue her follow-up with Dr. Theador Hawthorne regarding her renal insufficiency.  She will see [place] again on an as-needed basis   [name], MD [facility] Tel [phone]

## 2020-09-08 ENCOUNTER — Telehealth: Payer: Self-pay

## 2020-09-08 NOTE — Telephone Encounter (Signed)
Patient called to ask for more oxycodone. Arm has not changed since TFE visit, but pain is 6/10. Advised patient she is a month post op and pain should continue to resolve. Advised to use tylenol and elevate extremity, can try a heating pad as well. Patient verbalized understanding.

## 2020-09-09 ENCOUNTER — Other Ambulatory Visit: Payer: Self-pay

## 2020-09-09 ENCOUNTER — Encounter (HOSPITAL_COMMUNITY): Payer: Self-pay

## 2020-09-09 ENCOUNTER — Encounter (HOSPITAL_COMMUNITY)
Admission: RE | Admit: 2020-09-09 | Discharge: 2020-09-09 | Disposition: A | Discharge status: Home / Self Care | Payer: Medicare Other | Source: Ambulatory Visit | Attending: Nephrology | Admitting: Nephrology

## 2020-09-09 DIAGNOSIS — D631 Anemia in chronic kidney disease: Secondary | ICD-10-CM | POA: Diagnosis not present

## 2020-09-09 DIAGNOSIS — N184 Chronic kidney disease, stage 4 (severe): Secondary | ICD-10-CM | POA: Diagnosis not present

## 2020-09-09 LAB — CBC
HCT: 25.5 % — ABNORMAL LOW (ref 36.0–46.0)
Hemoglobin: 8.1 g/dL — ABNORMAL LOW (ref 12.0–15.0)
MCH: 27.3 pg (ref 26.0–34.0)
MCHC: 31.8 g/dL (ref 30.0–36.0)
MCV: 85.9 fL (ref 80.0–100.0)
Platelets: 196 10*3/uL (ref 150–400)
RBC: 2.97 MIL/uL — ABNORMAL LOW (ref 3.87–5.11)
RDW: 14 % (ref 11.5–15.5)
WBC: 6.3 10*3/uL (ref 4.0–10.5)
nRBC: 0 % (ref 0.0–0.2)

## 2020-09-09 LAB — IRON AND TIBC
Iron: 61 ug/dL (ref 28–170)
Saturation Ratios: 22 % (ref 10.4–31.8)
TIBC: 277 ug/dL (ref 250–450)
UIBC: 216 ug/dL

## 2020-09-09 LAB — RENAL FUNCTION PANEL
Albumin: 3.4 g/dL — ABNORMAL LOW (ref 3.5–5.0)
Anion gap: 13 (ref 5–15)
BUN: 68 mg/dL — ABNORMAL HIGH (ref 8–23)
CO2: 20 mmol/L — ABNORMAL LOW (ref 22–32)
Calcium: 8.9 mg/dL (ref 8.9–10.3)
Chloride: 106 mmol/L (ref 98–111)
Creatinine, Ser: 4.91 mg/dL — ABNORMAL HIGH (ref 0.44–1.00)
GFR, Estimated: 9 mL/min — ABNORMAL LOW (ref >60)
Glucose, Bld: 176 mg/dL — ABNORMAL HIGH (ref 70–99)
Phosphorus: 5.5 mg/dL — ABNORMAL HIGH (ref 2.5–4.6)
Potassium: 4.5 mmol/L (ref 3.5–5.1)
Sodium: 139 mmol/L (ref 135–145)

## 2020-09-09 LAB — FERRITIN: Ferritin: 1333 ng/mL — ABNORMAL HIGH (ref 11–307)

## 2020-09-09 LAB — POCT HEMOGLOBIN-HEMACUE: Hemoglobin: 8.1 g/dL — ABNORMAL LOW (ref 12.0–15.0)

## 2020-09-09 MED ORDER — EPOETIN ALFA 3000 UNIT/ML IJ SOLN
3000.0000 [IU] | Freq: Once | INTRAMUSCULAR | Status: AC
  Administered 2020-09-09: 3000 [IU] via SUBCUTANEOUS

## 2020-09-09 MED ORDER — EPOETIN ALFA 3000 UNIT/ML IJ SOLN
INTRAMUSCULAR | Status: AC
  Filled 2020-09-09: qty 1

## 2020-09-11 DIAGNOSIS — I10 Essential (primary) hypertension: Secondary | ICD-10-CM | POA: Diagnosis not present

## 2020-09-11 DIAGNOSIS — E1165 Type 2 diabetes mellitus with hyperglycemia: Secondary | ICD-10-CM | POA: Diagnosis not present

## 2020-09-11 DIAGNOSIS — Z299 Encounter for prophylactic measures, unspecified: Secondary | ICD-10-CM | POA: Diagnosis not present

## 2020-09-11 DIAGNOSIS — E11319 Type 2 diabetes mellitus with unspecified diabetic retinopathy without macular edema: Secondary | ICD-10-CM | POA: Diagnosis not present

## 2020-09-11 DIAGNOSIS — Z87891 Personal history of nicotine dependence: Secondary | ICD-10-CM | POA: Diagnosis not present

## 2020-09-11 DIAGNOSIS — I5032 Chronic diastolic (congestive) heart failure: Secondary | ICD-10-CM | POA: Diagnosis not present

## 2020-09-14 ENCOUNTER — Encounter: Payer: Medicare Other | Admitting: Vascular Surgery

## 2020-09-18 ENCOUNTER — Encounter: Payer: Self-pay | Admitting: Gastroenterology

## 2020-09-18 ENCOUNTER — Other Ambulatory Visit: Payer: Self-pay

## 2020-09-18 ENCOUNTER — Ambulatory Visit (INDEPENDENT_AMBULATORY_CARE_PROVIDER_SITE_OTHER): Payer: Medicare Other | Admitting: Gastroenterology

## 2020-09-18 VITALS — BP 149/74 | HR 78 | Temp 97.3°F | Ht 67.0 in | Wt 222.6 lb

## 2020-09-18 DIAGNOSIS — D509 Iron deficiency anemia, unspecified: Secondary | ICD-10-CM | POA: Diagnosis not present

## 2020-09-18 DIAGNOSIS — K219 Gastro-esophageal reflux disease without esophagitis: Secondary | ICD-10-CM | POA: Diagnosis not present

## 2020-09-18 NOTE — Patient Instructions (Signed)
1. To improve iron absorption try increasing your vitamin C consumption at time of taking your iron pills.  See below for vitamin C rich foods. 2. Increase iron rich foods. 3. Continue ferrous sulfate 325 mg twice daily. 4. I will follow-up upcoming labs, if any decline in your hemoglobin or iron, would advise pursuing upper endoscopy for further evaluation.  Reach out to [place] if you have not heard from [place] within 2 to 3 weeks after your upcoming labs. 5. At some point you should consider upper endoscopy for screening purposes given history of chronic reflux.  If your iron and hemoglobin continue to decline, you will need to have an upper endoscopy for further evaluation as well. 6. Your next colonoscopy will be due in August 2026. 7. Return office visit in 6 months.  Foods containing vitamin C: Oranges, kiwi, grapefruit, strawberries, tomatoes, broccoli, Brussels sprouts, cabbage, cauliflower, Bell peppers, white potatoes.   Iron-Rich Diet  Iron is a mineral that helps your body to produce hemoglobin. Hemoglobin is a protein in red blood cells that carries oxygen to your body's tissues. Eating too little iron may cause you to feel weak and tired, and it can increase your risk of infection. Iron is naturally found in many foods, and many foods have iron added to them (iron-fortified foods). You may need to follow an iron-rich diet if you do not have enough iron in your body due to certain medical conditions. The amount of iron that you need each day depends on your age, your sex, and any medical conditions you have. Follow instructions from your health care provider or a diet and nutrition specialist (dietitian) about how much iron you should eat each day. What are tips for following this plan? Reading food labels  Check food labels to see how many milligrams (mg) of iron are in each serving. Cooking  Cook foods in pots and pans that are made from iron.  Take these steps to make it easier for  your body to absorb iron from certain foods: ? Soak beans overnight before cooking. ? Soak whole grains overnight and drain them before using. ? Ferment flours before baking, such as by using yeast in bread dough. Meal planning  When you eat foods that contain iron, you should eat them with foods that are high in vitamin C. These include oranges, peppers, tomatoes, potatoes, and mango. Vitamin C helps your body to absorb iron. General information  Take iron supplements only as told by your health care provider. An overdose of iron can be life-threatening. If you were prescribed iron supplements, take them with orange juice or a vitamin C supplement.  When you eat iron-fortified foods or take an iron supplement, you should also eat foods that naturally contain iron, such as meat, poultry, and fish. Eating naturally iron-rich foods helps your body to absorb the iron that is added to other foods or contained in a supplement.  Certain foods and drinks prevent your body from absorbing iron properly. Avoid eating these foods in the same meal as iron-rich foods or with iron supplements. These foods include: ? Coffee, black tea, and red wine. ? Milk, dairy products, and foods that are high in calcium. ? Beans and soybeans. ? Whole grains. What foods should I eat? Fruits Prunes. Raisins. Eat fruits high in vitamin C, such as oranges, grapefruits, and strawberries, alongside iron-rich foods. Vegetables Spinach (cooked). Green peas. Broccoli. Fermented vegetables. Eat vegetables high in vitamin C, such as leafy greens, potatoes, bell peppers,  and tomatoes, alongside iron-rich foods. Grains Iron-fortified breakfast cereal. Iron-fortified whole-wheat bread. Enriched rice. Sprouted grains. Meats and other proteins Beef liver. Oysters. Beef. Shrimp. [place]. Chicken. [place]. Sardines. Chickpeas. Nuts. Tofu. Pumpkin seeds. Beverages Tomato juice. Fresh orange juice. Prune juice. Hibiscus tea. Fortified  instant breakfast shakes. Sweets and desserts Blackstrap molasses. Seasonings and condiments Tahini. Fermented soy sauce. Other foods Wheat germ. The items listed above may not be a complete list of recommended foods and beverages. Contact a dietitian for more information. What foods should I avoid? Grains Whole grains. Bran cereal. Bran flour. Oats. Meats and other proteins Soybeans. Products made from soy protein. Black beans. Lentils. Mung beans. Split peas. Dairy Milk. Cream. Cheese. Yogurt. Cottage cheese. Beverages Coffee. Black tea. Red wine. Sweets and desserts Cocoa. Chocolate. Ice cream. Other foods Basil. Oregano. Large amounts of parsley. The items listed above may not be a complete list of foods and beverages to avoid. Contact a dietitian for more information. Summary  Iron is a mineral that helps your body to produce hemoglobin. Hemoglobin is a protein in red blood cells that carries oxygen to your body's tissues.  Iron is naturally found in many foods, and many foods have iron added to them (iron-fortified foods).  When you eat foods that contain iron, you should eat them with foods that are high in vitamin C. Vitamin C helps your body to absorb iron.  Certain foods and drinks prevent your body from absorbing iron properly, such as whole grains and dairy products. You should avoid eating these foods in the same meal as iron-rich foods or with iron supplements. This information is not intended to replace advice given to you by your health care provider. Make sure you discuss any questions you have with your health care provider. Document Revised: 07/21/2017 Document Reviewed: 07/04/2017 Elsevier Patient Education  2021 [employer].

## 2020-09-18 NOTE — Progress Notes (Signed)
Primary Care Physician: Monico Blitz, MD  Primary Gastroenterologist:  Garfield Cornea, MD   Chief Complaint  Patient presents with  . Anemia    F/u. Doing okay    HPI: Cindy Park is a 65 y.o. female here for follow-up.  Patient initially seen back in June 2021 at the request of her nephrologist, Dr. Theador Hawthorne for IDA.  She has chronic kidney disease stage V.  Patient receives iron infusions as needed, also weekly epo shots.  She also has chronic GERD.  Has been on PPI for more than 5 years.  Colonoscopy in August 2021, 2 tubular adenomas removed.  Next colonoscopy in 5 years.  Patient presents back for 42-month follow-up.  Patient's hemoglobin has been running in the 8 to 9 range since November.  Last hemoglobin on January 19 was 8.1.  Iron 61, iron saturation 22%, TIBC 277, iron saturations 22%, ferritin 1333.  Prior to that her hemoglobin has been in the 9-10 range.  Nephrology feels like hemoglobin is not quite at goal and is hopeful that the change may be related to recent surgery complicated by infection.  Plans to recheck labs next month.  Heartburn requires ppi. If misses dose then recurrent symptoms. Goiter, feels pressure when trying to sleep.  No dysphagia.  Stools are dark on iron.  Bowel movements regular.  No blood in the stool.  No abdominal pain.  No nausea or vomiting.  Denies any recent iron infusions.  Has been on ferrous sulfate 325 mg twice daily chronically.  May have to have additional iron infusions based on upcoming labs.   Current Outpatient Medications  Medication Sig Dispense Refill  . acetaminophen (TYLENOL) 500 MG tablet Take 1,000 mg by mouth every 6 (six) hours as needed for moderate pain.     Marland Kitchen albuterol (PROVENTIL) (2.5 MG/3ML) 0.083% nebulizer solution Take 2.5 mg by nebulization 4 (four) times daily.     Marland Kitchen albuterol (VENTOLIN HFA) 108 (90 Base) MCG/ACT inhaler Inhale 2 puffs into the lungs every 6 (six) hours as needed for wheezing or shortness of  breath.    Marland Kitchen amLODipine (NORVASC) 5 MG tablet Take 10 mg by mouth 2 (two) times daily.     Marland Kitchen aspirin EC 81 MG tablet Take 81 mg by mouth daily.    Marland Kitchen atorvastatin (LIPITOR) 40 MG tablet Take 40 mg by mouth at bedtime.     . calcitRIOL (ROCALTROL) 0.25 MCG capsule Take 0.25 mcg by mouth daily.    . carvedilol (COREG) 25 MG tablet Take 25 mg by mouth 2 (two) times daily with a meal.     . Cholecalciferol (VITAMIN D) 125 MCG (5000 UT) CAPS Take 5,000 Units by mouth in the morning and at bedtime.     . clonazePAM (KLONOPIN) 0.5 MG tablet Take 0.5 mg by mouth daily as needed for anxiety.     . dicyclomine (BENTYL) 10 MG capsule Take 10 mg by mouth 2 (two) times daily.     Marland Kitchen epoetin alfa (EPOGEN) 3000 UNIT/ML injection Inject 3,000 Units into the skin every 14 (fourteen) days.     . ferrous sulfate 325 (65 FE) MG tablet Take 325 mg by mouth 2 (two) times daily with a meal.    . Fluticasone-Umeclidin-Vilant (TRELEGY ELLIPTA) 100-62.5-25 MCG/INH AEPB Inhale 1 puff into the lungs daily.    . furosemide (LASIX) 80 MG tablet Take 160 mg by mouth 2 (two) times daily.     Marland Kitchen gabapentin (NEURONTIN) 300 MG  capsule Take 600 mg by mouth 2 (two) times daily.     Marland Kitchen gemfibrozil (LOPID) 600 MG tablet Take 600 mg by mouth 2 (two) times daily before a meal.    . Insulin Lispro Prot & Lispro (HUMALOG MIX 75/25 [place]) Inject 15-60 Units into the skin See admin instructions. Inject 60 units into the skin in the morning and 15 units at lunch and 60 units at supper    . levothyroxine (SYNTHROID) 50 MCG tablet Take 50 mcg by mouth daily before breakfast.     . montelukast (SINGULAIR) 10 MG tablet Take 10 mg by mouth at bedtime.    Marland Kitchen omeprazole (PRILOSEC) 20 MG capsule Take 20 mg by mouth daily.     Marland Kitchen oxyCODONE-acetaminophen (PERCOCET) 5-325 MG tablet Take 1 tablet by mouth every 6 (six) hours as needed for severe pain. 10 tablet 0  . sitaGLIPtin (JANUVIA) 25 MG tablet Take 25 mg by mouth daily.    . sodium bicarbonate 650 MG  tablet Take 650 mg by mouth 2 (two) times daily.    Marland Kitchen triamcinolone (KENALOG) 0.1 % Apply 1 application topically daily. To feet    . vitamin B-12 (CYANOCOBALAMIN) 1000 MCG tablet Take 1,000 mcg by mouth daily.     . vitamin E 180 MG (400 UNITS) capsule Take 400 Units by mouth daily.     No current facility-administered medications for this visit.    Allergies as of 09/18/2020 - Review Complete 09/18/2020  Allergen Reaction Noted  . Latex Shortness Of Breath and Rash 06/11/2020  . Niacin Other (See Comments)   . Other Dermatitis 03/13/2020  . Tomato Other (See Comments) 03/13/2020  . Bactrim [sulfamethoxazole-trimethoprim] Swelling and Rash 01/24/2020    ROS:  General: Negative for anorexia, weight loss, fever, chills, positive fatigue, weakness. ENT: Negative for hoarseness, difficulty swallowing , nasal congestion. CV: Negative for chest pain, angina, palpitations, dyspnea on exertion, peripheral edema.  Respiratory: Negative for dyspnea at rest, dyspnea on exertion, cough, sputum, wheezing.  GI: See history of present illness. GU:  Negative for dysuria, hematuria, urinary incontinence, urinary frequency, nocturnal urination.  Endo: Negative for unusual weight change.    Physical Examination:   BP (!) 149/74   Pulse 78   Temp (!) 97.3 F (36.3 C)   Ht 5\' 7"  (1.702 m)   Wt 222 lb 9.6 oz (101 kg)   BMI 34.86 kg/m   General: Well-nourished, well-developed in no acute distress.  Eyes: No icterus. Mouth: masked Abdomen: Bowel sounds are normal, nontender, nondistended, no hepatosplenomegaly or masses, no abdominal bruits or hernia , no rebound or guarding.   Extremities: No lower extremity edema. No clubbing or deformities. Neuro: Alert and oriented x 4   Skin: Warm and dry, no jaundice.   Psych: Alert and cooperative, normal mood and affect.  Labs:  See above  Imaging Studies: No results found.  Impression/plan:  65 year old female with stage V chronic kidney  disease, chronic anemia, diabetes, COPD, hypertension presenting for follow-up of IDA.  Receives EPO injections regularly.  Has received iron infusions in the past but none recently.  Continues to follow-up with Dr. Theador Hawthorne, plans for updating labs within the next month and making decision regarding additional iron infusions.  For IDA, she completed a colonoscopy as outlined.  Nothing really found to contribute to IDA.  We have discussed potentially upper endoscopy for further evaluation of IDA but at this time patient would like to await future labs and make a decision.  If she  has any further decline in iron or hemoglobin she would be agreeable.  Encouraged her to take oral iron supplements along with vitamin C rich foods.  Handout provided.  Increased daily dietary iron.  Chronic GERD, well controlled on PPI therapy.  We discussed possibility of an EGD for screening for Barrett's esophagus even if not required for further evaluation of IDA as outlined.  Patient recently recovering from surgery and infection and would like to postpone any invasive testing for now.  We will follow-up upcoming labs and make decision regarding possibility of upper endoscopy based on findings.  Otherwise we will see her back in 6 months.

## 2020-09-21 DIAGNOSIS — I10 Essential (primary) hypertension: Secondary | ICD-10-CM | POA: Diagnosis not present

## 2020-09-23 ENCOUNTER — Encounter (HOSPITAL_COMMUNITY): Admission: RE | Admit: 2020-09-23 | Payer: Medicare Other | Source: Ambulatory Visit

## 2020-09-23 ENCOUNTER — Encounter (HOSPITAL_COMMUNITY): Payer: Medicare Other

## 2020-09-23 DIAGNOSIS — E1129 Type 2 diabetes mellitus with other diabetic kidney complication: Secondary | ICD-10-CM | POA: Diagnosis not present

## 2020-09-23 DIAGNOSIS — N189 Chronic kidney disease, unspecified: Secondary | ICD-10-CM | POA: Diagnosis not present

## 2020-09-23 DIAGNOSIS — E211 Secondary hyperparathyroidism, not elsewhere classified: Secondary | ICD-10-CM | POA: Diagnosis not present

## 2020-09-23 DIAGNOSIS — N185 Chronic kidney disease, stage 5: Secondary | ICD-10-CM | POA: Diagnosis not present

## 2020-09-23 DIAGNOSIS — E1122 Type 2 diabetes mellitus with diabetic chronic kidney disease: Secondary | ICD-10-CM | POA: Diagnosis not present

## 2020-09-25 DIAGNOSIS — D631 Anemia in chronic kidney disease: Secondary | ICD-10-CM | POA: Diagnosis not present

## 2020-09-25 DIAGNOSIS — E1122 Type 2 diabetes mellitus with diabetic chronic kidney disease: Secondary | ICD-10-CM | POA: Diagnosis not present

## 2020-09-25 DIAGNOSIS — R809 Proteinuria, unspecified: Secondary | ICD-10-CM | POA: Diagnosis not present

## 2020-09-25 DIAGNOSIS — I129 Hypertensive chronic kidney disease with stage 1 through stage 4 chronic kidney disease, or unspecified chronic kidney disease: Secondary | ICD-10-CM | POA: Diagnosis not present

## 2020-09-25 DIAGNOSIS — N185 Chronic kidney disease, stage 5: Secondary | ICD-10-CM | POA: Diagnosis not present

## 2020-09-25 DIAGNOSIS — E1129 Type 2 diabetes mellitus with other diabetic kidney complication: Secondary | ICD-10-CM | POA: Diagnosis not present

## 2020-09-25 DIAGNOSIS — N189 Chronic kidney disease, unspecified: Secondary | ICD-10-CM | POA: Diagnosis not present

## 2020-09-25 DIAGNOSIS — E211 Secondary hyperparathyroidism, not elsewhere classified: Secondary | ICD-10-CM | POA: Diagnosis not present

## 2020-09-28 DIAGNOSIS — J449 Chronic obstructive pulmonary disease, unspecified: Secondary | ICD-10-CM | POA: Diagnosis not present

## 2020-09-28 DIAGNOSIS — N184 Chronic kidney disease, stage 4 (severe): Secondary | ICD-10-CM | POA: Diagnosis not present

## 2020-09-28 DIAGNOSIS — E1165 Type 2 diabetes mellitus with hyperglycemia: Secondary | ICD-10-CM | POA: Diagnosis not present

## 2020-09-28 DIAGNOSIS — Z299 Encounter for prophylactic measures, unspecified: Secondary | ICD-10-CM | POA: Diagnosis not present

## 2020-09-28 DIAGNOSIS — R110 Nausea: Secondary | ICD-10-CM | POA: Diagnosis not present

## 2020-09-28 DIAGNOSIS — Z87891 Personal history of nicotine dependence: Secondary | ICD-10-CM | POA: Diagnosis not present

## 2020-09-29 NOTE — Progress Notes (Shared)
[facility] Note  09/30/2020     CHIEF COMPLAINT Patient presents for No chief complaint on file.   HISTORY OF PRESENT ILLNESS: Cindy Park is a 65 y.o. female who presents to the clinic today for:   pt states vision is doing well, she had no problems after laser treatment and used PF QID for 7 days as directed, she denies fol   Referring physician: Leticia Clas OD [address], [place] [address]  HISTORICAL INFORMATION:   Selected notes from the MEDICAL RECORD NUMBER Referred by Dr. Leticia Clas for DM eval LEE: 08.19.2021 BCVA OD: 20/20 OS: 20/20 Ocular Hx- cataract, CWS, macular edema PMH- DM, HTN, stage 3 kidney failure, CHF   CURRENT MEDICATIONS: No current outpatient medications on file. (Ophthalmic Drugs)   No current facility-administered medications for this visit. (Ophthalmic Drugs)   Current Outpatient Medications (Other)  Medication Sig  . acetaminophen (TYLENOL) 500 MG tablet Take 1,000 mg by mouth every 6 (six) hours as needed for moderate pain.   Marland Kitchen albuterol (PROVENTIL) (2.5 MG/3ML) 0.083% nebulizer solution Take 2.5 mg by nebulization 4 (four) times daily.   Marland Kitchen albuterol (VENTOLIN HFA) 108 (90 Base) MCG/ACT inhaler Inhale 2 puffs into the lungs every 6 (six) hours as needed for wheezing or shortness of breath.  Marland Kitchen amLODipine (NORVASC) 5 MG tablet Take 10 mg by mouth 2 (two) times daily.   Marland Kitchen aspirin EC 81 MG tablet Take 81 mg by mouth daily.  Marland Kitchen atorvastatin (LIPITOR) 40 MG tablet Take 40 mg by mouth at bedtime.   . calcitRIOL (ROCALTROL) 0.25 MCG capsule Take 0.25 mcg by mouth daily.  . carvedilol (COREG) 25 MG tablet Take 25 mg by mouth 2 (two) times daily with a meal.   . Cholecalciferol (VITAMIN D) 125 MCG (5000 UT) CAPS Take 5,000 Units by mouth in the morning and at bedtime.   . clonazePAM (KLONOPIN) 0.5 MG tablet Take 0.5 mg by mouth daily as needed for anxiety.   . dicyclomine (BENTYL) 10 MG capsule Take 10  mg by mouth 2 (two) times daily.   Marland Kitchen epoetin alfa (EPOGEN) 3000 UNIT/ML injection Inject 3,000 Units into the skin every 14 (fourteen) days.   . ferrous sulfate 325 (65 FE) MG tablet Take 325 mg by mouth 2 (two) times daily with a meal.  . Fluticasone-Umeclidin-Vilant (TRELEGY ELLIPTA) 100-62.5-25 MCG/INH AEPB Inhale 1 puff into the lungs daily.  . furosemide (LASIX) 80 MG tablet Take 160 mg by mouth 2 (two) times daily.   Marland Kitchen gabapentin (NEURONTIN) 300 MG capsule Take 600 mg by mouth 2 (two) times daily.   Marland Kitchen gemfibrozil (LOPID) 600 MG tablet Take 600 mg by mouth 2 (two) times daily before a meal.  . Insulin Lispro Prot & Lispro (HUMALOG MIX 75/25 [place]) Inject 15-60 Units into the skin See admin instructions. Inject 60 units into the skin in the morning and 15 units at lunch and 60 units at supper  . levothyroxine (SYNTHROID) 50 MCG tablet Take 50 mcg by mouth daily before breakfast.   . montelukast (SINGULAIR) 10 MG tablet Take 10 mg by mouth at bedtime.  Marland Kitchen omeprazole (PRILOSEC) 20 MG capsule Take 20 mg by mouth daily.   Marland Kitchen oxyCODONE-acetaminophen (PERCOCET) 5-325 MG tablet Take 1 tablet by mouth every 6 (six) hours as needed for severe pain.  . sitaGLIPtin (JANUVIA) 25 MG tablet Take 25 mg by mouth daily.  . sodium bicarbonate 650 MG tablet  Take 650 mg by mouth 2 (two) times daily.  Marland Kitchen triamcinolone (KENALOG) 0.1 % Apply 1 application topically daily. To feet  . vitamin B-12 (CYANOCOBALAMIN) 1000 MCG tablet Take 1,000 mcg by mouth daily.   . vitamin E 180 MG (400 UNITS) capsule Take 400 Units by mouth daily.   No current facility-administered medications for this visit. (Other)      REVIEW OF SYSTEMS:    ALLERGIES Allergies  Allergen Reactions  . Latex Shortness Of Breath and Rash    With powder   . Niacin Other (See Comments)    Burning  . Other Dermatitis    Powder in the gloves   . Tomato Other (See Comments)    Breaks pt out   . Bactrim [Sulfamethoxazole-Trimethoprim] Swelling  and Rash    PAST MEDICAL HISTORY Past Medical History:  Diagnosis Date  . Anemia   . Arthritis   . Asthma   . Cataract    Combined form OU  . COPD (chronic obstructive pulmonary disease) ([place])   . Diabetes mellitus without complication ([place])   . Diabetic retinopathy ([place])    BDR OU  . Family history of adverse reaction to anesthesia    sister- difficult to wake up from anesthesia  . Fluid excess   . GERD (gastroesophageal reflux disease)   . Goiter   . Hypertension   . Hypertensive retinopathy    OU  . Renal failure    Past Surgical History:  Procedure Laterality Date  . ABDOMINAL HYSTERECTOMY    . AV FISTULA PLACEMENT Left 06/11/2020   Procedure: LEFT ARM ARTERIOVENOUS (AV) FISTULA CREATION;  Surgeon: [name], MD;  Location: [facility];  Service: Vascular;  Laterality: Left;  . BASCILIC VEIN TRANSPOSITION Left 08/06/2020   Procedure: LEFT ARM SECOND STAGE BASCILIC VEIN TRANSPOSITION;  Surgeon: [name], MD;  Location: [facility];  Service: Vascular;  Laterality: Left;  . CHOLECYSTECTOMY    . COLONOSCOPY N/A 03/25/2020   [place]: two tubular adenomas removed. next colonoscopy five years  . POLYPECTOMY  03/25/2020   Procedure: POLYPECTOMY;  Surgeon: Daneil Dolin, MD;  Location: [facility] SUITE;  Service: Endoscopy;;    FAMILY HISTORY Family History  Problem Relation Age of Onset  . Kidney disease Mother        ESRD  . Kidney disease Sister        ESRD  . Colon polyps Brother 68  . Kidney disease Maternal Grandmother        ESRD  . Colon cancer Neg Hx     SOCIAL HISTORY Social History   Tobacco Use  . Smoking status: Never Smoker  . Smokeless tobacco: Never Used  Vaping Use  . Vaping Use: Never used  Substance Use Topics  . Alcohol use: No  . Drug use: No         OPHTHALMIC EXAM:  Not recorded     IMAGING AND PROCEDURES  Imaging and Procedures for 09/30/2020           ASSESSMENT/PLAN:    ICD-10-CM   1. Moderate nonproliferative diabetic  retinopathy of both eyes with macular edema associated with diabetes mellitus due to underlying condition ([place])  D17.6160   2. Retinal edema  H35.81   3. Lattice degeneration of both retinas  H35.413   4. Retinal holes, bilateral  H33.323   5. Essential hypertension  I10   6. Hypertensive retinopathy of both eyes  H35.033   7. Combined forms of age-related cataract of  both eyes  H25.813   8. Dry eyes, bilateral  H04.123     1,2. Moderate non-proliferative diabetic retinopathy w/ DME OU  - Last A1c 6.9 on 12.31.2020 - exam shows scattered cystic changes and scattered MA/IRH  OU - FA 09.15.21 shows late leaking MA OU, no NV OU - OCT shows +DME OU: focal IRF centrally and inf nasal macula OD; Focal cystic changes nasal and superior macula OS - BCVA remains very good -- 20/20 OU - Pt wishes to defer tx at this time -- reasonable - f/u 3 months, DFE, OCT  3,4. Lattice degeneration w/ atrophic holes, OU - patches of inferior lattice OU; OD with small patch at 12 - discussed findings, prognosis, and treatment options including observation - s/p laser retinopexy OD (09.29.21) -- good laser surrounding - s/p laser retinopexy OS (10.13.21) -- good laser surrounidng - complete PF drops - f/u 3 months  5,6. Hypertensive retinopathy OU - discussed importance of tight BP control - monitor  7. Mixed Cataract OU - The symptoms of cataract, surgical options, and treatments and risks were discussed with patient. - discussed diagnosis and progression - not yet visually significant - monitor for now  8. Dry eyes OU - recommend artificial tears and lubricating ointment as needed   Ophthalmic Meds Ordered this visit:  No orders of the defined types were placed in this encounter.      No follow-ups on file.  There are no Patient Instructions on file for this visit.   Explained the diagnoses, plan, and follow up with the patient and they expressed understanding.  Patient expressed  understanding of the importance of proper follow up care.   This document serves as a record of services personally performed by Gardiner Sleeper, MD, PhD. It was created on their behalf by Roselee Nova, COMT. The creation of this record is the provider's dictation and/or activities during the visit.  Electronically signed by: Roselee Nova, COMT 09/29/20 1:29 PM      Gardiner Sleeper, M.D., Ph.D. Diseases & Surgery of the Retina and [place] 06/30/2020     Abbreviations: M myopia (nearsighted); A astigmatism; H hyperopia (farsighted); P presbyopia; Mrx spectacle prescription;  CTL contact lenses; OD right eye; OS left eye; OU both eyes  XT exotropia; ET esotropia; PEK punctate epithelial keratitis; PEE punctate epithelial erosions; DES dry eye syndrome; MGD meibomian gland dysfunction; ATs artificial tears; PFAT's preservative free artificial tears; [place] nuclear sclerotic cataract; PSC posterior subcapsular cataract; ERM epi-retinal membrane; PVD posterior vitreous detachment; RD retinal detachment; DM diabetes mellitus; DR diabetic retinopathy; NPDR non-proliferative diabetic retinopathy; PDR proliferative diabetic retinopathy; CSME clinically significant macular edema; DME diabetic macular edema; dbh dot blot hemorrhages; CWS cotton wool spot; POAG primary open angle glaucoma; C/D cup-to-disc ratio; HVF humphrey visual field; GVF goldmann visual field; OCT optical coherence tomography; IOP intraocular pressure; BRVO Branch retinal vein occlusion; CRVO central retinal vein occlusion; CRAO central retinal artery occlusion; BRAO branch retinal artery occlusion; RT retinal tear; SB scleral buckle; PPV pars plana vitrectomy; VH Vitreous hemorrhage; PRP panretinal laser photocoagulation; IVK intravitreal kenalog; VMT vitreomacular traction; MH Macular hole;  NVD neovascularization of the disc; NVE neovascularization elsewhere; AREDS age related eye disease study; ARMD  age related macular degeneration; POAG primary open angle glaucoma; EBMD epithelial/anterior basement membrane dystrophy; ACIOL anterior chamber intraocular lens; IOL intraocular lens; PCIOL posterior chamber intraocular lens; Phaco/IOL phacoemulsification with intraocular lens placement; [place] photorefractive keratectomy; LASIK laser assisted in situ keratomileusis;  HTN hypertension; DM diabetes mellitus; COPD chronic obstructive pulmonary disease

## 2020-09-30 ENCOUNTER — Encounter (INDEPENDENT_AMBULATORY_CARE_PROVIDER_SITE_OTHER): Payer: Medicare Other | Admitting: Ophthalmology

## 2020-09-30 DIAGNOSIS — E083313 Diabetes mellitus due to underlying condition with moderate nonproliferative diabetic retinopathy with macular edema, bilateral: Secondary | ICD-10-CM

## 2020-09-30 DIAGNOSIS — H35033 Hypertensive retinopathy, bilateral: Secondary | ICD-10-CM

## 2020-09-30 DIAGNOSIS — H35413 Lattice degeneration of retina, bilateral: Secondary | ICD-10-CM

## 2020-09-30 DIAGNOSIS — H04123 Dry eye syndrome of bilateral lacrimal glands: Secondary | ICD-10-CM

## 2020-09-30 DIAGNOSIS — H3581 Retinal edema: Secondary | ICD-10-CM

## 2020-09-30 DIAGNOSIS — H33323 Round hole, bilateral: Secondary | ICD-10-CM

## 2020-09-30 DIAGNOSIS — I10 Essential (primary) hypertension: Secondary | ICD-10-CM

## 2020-09-30 DIAGNOSIS — H25813 Combined forms of age-related cataract, bilateral: Secondary | ICD-10-CM

## 2020-10-02 ENCOUNTER — Encounter (HOSPITAL_COMMUNITY)
Admission: RE | Admit: 2020-10-02 | Discharge: 2020-10-02 | Disposition: A | Discharge status: Home / Self Care | Payer: Medicare Other | Source: Ambulatory Visit | Attending: Nephrology | Admitting: Nephrology

## 2020-10-02 ENCOUNTER — Other Ambulatory Visit: Payer: Self-pay

## 2020-10-02 ENCOUNTER — Encounter (HOSPITAL_COMMUNITY): Payer: Self-pay

## 2020-10-02 DIAGNOSIS — N184 Chronic kidney disease, stage 4 (severe): Secondary | ICD-10-CM | POA: Diagnosis not present

## 2020-10-02 DIAGNOSIS — D631 Anemia in chronic kidney disease: Secondary | ICD-10-CM | POA: Diagnosis not present

## 2020-10-02 LAB — POCT HEMOGLOBIN-HEMACUE: Hemoglobin: 8.5 g/dL — ABNORMAL LOW (ref 12.0–15.0)

## 2020-10-02 MED ORDER — EPOETIN ALFA 3000 UNIT/ML IJ SOLN
3000.0000 [IU] | Freq: Once | INTRAMUSCULAR | Status: AC
  Administered 2020-10-02: 3000 [IU] via SUBCUTANEOUS

## 2020-10-02 MED ORDER — EPOETIN ALFA 3000 UNIT/ML IJ SOLN
INTRAMUSCULAR | Status: AC
  Filled 2020-10-02: qty 1

## 2020-10-12 DIAGNOSIS — E1129 Type 2 diabetes mellitus with other diabetic kidney complication: Secondary | ICD-10-CM | POA: Diagnosis not present

## 2020-10-12 DIAGNOSIS — I10 Essential (primary) hypertension: Secondary | ICD-10-CM | POA: Diagnosis not present

## 2020-10-12 DIAGNOSIS — E1165 Type 2 diabetes mellitus with hyperglycemia: Secondary | ICD-10-CM | POA: Diagnosis not present

## 2020-10-12 DIAGNOSIS — R030 Elevated blood-pressure reading, without diagnosis of hypertension: Secondary | ICD-10-CM | POA: Diagnosis not present

## 2020-10-12 DIAGNOSIS — Z299 Encounter for prophylactic measures, unspecified: Secondary | ICD-10-CM | POA: Diagnosis not present

## 2020-10-12 DIAGNOSIS — E1122 Type 2 diabetes mellitus with diabetic chronic kidney disease: Secondary | ICD-10-CM | POA: Diagnosis not present

## 2020-10-12 DIAGNOSIS — R809 Proteinuria, unspecified: Secondary | ICD-10-CM | POA: Diagnosis not present

## 2020-10-13 NOTE — Progress Notes (Addendum)
[facility] Note  10/14/2020     CHIEF COMPLAINT Patient presents for Retina Follow Up   HISTORY OF PRESENT ILLNESS: Cindy Park is a 65 y.o. female who presents to the clinic today for:   HPI    Retina Follow Up    Patient presents with  Diabetic Retinopathy.  In both eyes.  This started months ago.  Severity is moderate.  Duration of months.  Since onset it is stable.  I, the attending physician,  performed the HPI with the patient and updated documentation appropriately.          Comments    Pt states vision is the same OU.  Has occasional blurry vision but not often.  Pt denies eye pain or discomfort.  Pt denies any new or worsening floaters or fol OU.       Last edited by Bernarda Caffey, MD on 10/14/2020  4:37 PM. (History)    pt states her vision has been a little blurry recently, she states her last A1c was 6.9 which is an improvement, her BP has been running a little high (143/89), she states she is going back to see her PCP and has to take him BP readings so he can adjust her meds   Referring physician: Leticia Clas OD [address], [place] [id number]  HISTORICAL INFORMATION:   Selected notes from the MEDICAL RECORD NUMBER Referred by Dr. Leticia Clas for DM eval LEE: 08.19.2021 BCVA OD: 20/20 OS: 20/20 Ocular Hx- cataract, CWS, macular edema PMH- DM, HTN, stage 3 kidney failure, CHF   CURRENT MEDICATIONS: No current outpatient medications on file. (Ophthalmic Drugs)   No current facility-administered medications for this visit. (Ophthalmic Drugs)   Current Outpatient Medications (Other)  Medication Sig  . acetaminophen (TYLENOL) 500 MG tablet Take 1,000 mg by mouth every 6 (six) hours as needed for moderate pain.   Marland Kitchen albuterol (PROVENTIL) (2.5 MG/3ML) 0.083% nebulizer solution Take 2.5 mg by nebulization 4 (four) times daily.   Marland Kitchen albuterol (VENTOLIN HFA) 108 (90 Base) MCG/ACT inhaler Inhale 2 puffs into the lungs  every 6 (six) hours as needed for wheezing or shortness of breath.  Marland Kitchen amLODipine (NORVASC) 5 MG tablet Take 10 mg by mouth 2 (two) times daily.   Marland Kitchen aspirin EC 81 MG tablet Take 81 mg by mouth daily.  Marland Kitchen atorvastatin (LIPITOR) 40 MG tablet Take 40 mg by mouth at bedtime.   . calcitRIOL (ROCALTROL) 0.25 MCG capsule Take 0.25 mcg by mouth daily.  . carvedilol (COREG) 25 MG tablet Take 25 mg by mouth 2 (two) times daily with a meal.   . Cholecalciferol (VITAMIN D) 125 MCG (5000 UT) CAPS Take 5,000 Units by mouth in the morning and at bedtime.   . clonazePAM (KLONOPIN) 0.5 MG tablet Take 0.5 mg by mouth daily as needed for anxiety.   . dicyclomine (BENTYL) 10 MG capsule Take 10 mg by mouth 2 (two) times daily.   Marland Kitchen epoetin alfa (EPOGEN) 3000 UNIT/ML injection Inject 3,000 Units into the skin every 14 (fourteen) days.   . ferrous sulfate 325 (65 FE) MG tablet Take 325 mg by mouth 2 (two) times daily with a meal.  . Fluticasone-Umeclidin-Vilant (TRELEGY ELLIPTA) 100-62.5-25 MCG/INH AEPB Inhale 1 puff into the lungs daily.  . furosemide (LASIX) 80 MG tablet Take 160 mg by mouth 2 (two) times daily.   Marland Kitchen gabapentin (NEURONTIN) 300 MG capsule Take 600 mg by mouth  2 (two) times daily.   Marland Kitchen gemfibrozil (LOPID) 600 MG tablet Take 600 mg by mouth 2 (two) times daily before a meal.  . Insulin Lispro Prot & Lispro (HUMALOG MIX 75/25 [place]) Inject 15-60 Units into the skin See admin instructions. Inject 60 units into the skin in the morning and 15 units at lunch and 60 units at supper  . levothyroxine (SYNTHROID) 50 MCG tablet Take 50 mcg by mouth daily before breakfast.   . montelukast (SINGULAIR) 10 MG tablet Take 10 mg by mouth at bedtime.  Marland Kitchen omeprazole (PRILOSEC) 20 MG capsule Take 20 mg by mouth daily.   Marland Kitchen oxyCODONE-acetaminophen (PERCOCET) 5-325 MG tablet Take 1 tablet by mouth every 6 (six) hours as needed for severe pain.  . sitaGLIPtin (JANUVIA) 25 MG tablet Take 25 mg by mouth daily.  . sodium bicarbonate 650  MG tablet Take 650 mg by mouth 2 (two) times daily.  Marland Kitchen triamcinolone (KENALOG) 0.1 % Apply 1 application topically daily. To feet  . vitamin B-12 (CYANOCOBALAMIN) 1000 MCG tablet Take 1,000 mcg by mouth daily.   . vitamin E 180 MG (400 UNITS) capsule Take 400 Units by mouth daily.   No current facility-administered medications for this visit. (Other)      REVIEW OF SYSTEMS: ROS    Positive for: Gastrointestinal, Endocrine, Eyes, Respiratory   Negative for: Constitutional, Neurological, Skin, Genitourinary, Musculoskeletal, HENT, Cardiovascular, Psychiatric, Allergic/Imm, Heme/Lymph   Last edited by Doneen Poisson on 10/14/2020  1:59 PM. (History)       ALLERGIES Allergies  Allergen Reactions  . Latex Shortness Of Breath and Rash    With powder   . Niacin Other (See Comments)    Burning  . Other Dermatitis    Powder in the gloves   . Tomato Other (See Comments)    Breaks pt out   . Bactrim [Sulfamethoxazole-Trimethoprim] Swelling and Rash    PAST MEDICAL HISTORY Past Medical History:  Diagnosis Date  . Anemia   . Arthritis   . Asthma   . Cataract    Combined form OU  . COPD (chronic obstructive pulmonary disease) ([place])   . Diabetes mellitus without complication ([place])   . Diabetic retinopathy ([place])    BDR OU  . Family history of adverse reaction to anesthesia    sister- difficult to wake up from anesthesia  . Fluid excess   . GERD (gastroesophageal reflux disease)   . Goiter   . Hypertension   . Hypertensive retinopathy    OU  . Renal failure    Past Surgical History:  Procedure Laterality Date  . ABDOMINAL HYSTERECTOMY    . AV FISTULA PLACEMENT Left 06/11/2020   Procedure: LEFT ARM ARTERIOVENOUS (AV) FISTULA CREATION;  Surgeon: [name], MD;  Location: [facility] ORS;  Service: Vascular;  Laterality: Left;  . BASCILIC VEIN TRANSPOSITION Left 08/06/2020   Procedure: LEFT ARM SECOND STAGE BASCILIC VEIN TRANSPOSITION;  Surgeon: [name], MD;  Location: [facility]  ORS;  Service: Vascular;  Laterality: Left;  . CHOLECYSTECTOMY    . COLONOSCOPY N/A 03/25/2020   [place]k: two tubular adenomas removed. next colonoscopy five years  . POLYPECTOMY  03/25/2020   Procedure: POLYPECTOMY;  Surgeon: Daneil Dolin, MD;  Location: [facility] ENDO SUITE;  Service: Endoscopy;;    FAMILY HISTORY Family History  Problem Relation Age of Onset  . Kidney disease Mother        ESRD  . Kidney disease Sister        ESRD  .  Colon polyps Brother 77  . Kidney disease Maternal Grandmother        ESRD  . Colon cancer Neg Hx     SOCIAL HISTORY Social History   Tobacco Use  . Smoking status: Never Smoker  . Smokeless tobacco: Never Used  Vaping Use  . Vaping Use: Never used  Substance Use Topics  . Alcohol use: No  . Drug use: No         OPHTHALMIC EXAM:  Base Eye Exam    Visual Acuity (Snellen - Linear)      Right Left   Dist cc 20/25 20/25 -2   Dist ph cc NI 20/25   Correction: Glasses       Tonometry (Tonopen, 2:07 PM)      Right Left   Pressure 16 14       Pupils      Dark Light Shape React APD   Right 4 3 Round Brisk 0   Left 4 3 Round Brisk 0       Visual Fields      Left Right    Full Full       Extraocular Movement      Right Left    Full Full       Neuro/Psych    Oriented x3: Yes   Mood/Affect: Normal       Dilation    Both eyes: 1.0% Mydriacyl, 2.5% Phenylephrine @ 2:07 PM        Slit Lamp and Fundus Exam    Slit Lamp Exam      Right Left   Lids/Lashes Dermato, mild MGD Dermarto, mild MGD   Conjunctiva/Sclera Nasal pinguecula, mild melanosis, inferior Conjunctivochalasis Nasal pinguecula, mild melanosis, inferior Conjunctivochalasis   Cornea 1+PEE, trace arcus trace PEE, trace arcus   Anterior Chamber Deep and quiet Deep and quiet   Iris Round and dilated Round and dilated   Lens 2+ NS, 2-3+ cortical 2-3+ NS, 2-3+ cortical   Vitreous Synerisis Synerisis       Fundus Exam      Right Left   Disc Pink, sharp, Compact,  focal PPP Pink, sharp, compact   C/D Ratio 0.1 0.2   Macula Blunted foveal reflex, mild rpe mottling, scattered cystic changes - increased, scattered MA / IRH Good foveal reflex, mild rpe mottling, scattered cystic changes - slightly increased, scattered MA   Vessels attenuated, mild tortuousity attenuated, mild tortuousity, mild Copper wiring   Periphery Attached; Focal pigmented lattice with atrophic hole at 12 and in IT quad, greatest at 0730 -- good laser surrounding all patches of lattice, minimal MA Attached, Focal pigmented lattice from 0530-0700 -- good laser surrounding, minimal MA, No new RT/RD        Refraction    Wearing Rx      Sphere Cylinder Axis Add   Right [place] +0.50 045 +2.00   Left +0.05 +0.50 128 +2.00   Type: PAL          IMAGING AND PROCEDURES  Imaging and Procedures for 10/14/2020  OCT, Retina - OU - Both Eyes       Right Eye Quality was good. Central Foveal Thickness: 288. Progression has worsened. Findings include abnormal foveal contour, intraretinal fluid, no SRF (Interval increase in IRF centrally, but interval decrease in IRF nasally).   Left Eye Quality was good. Central Foveal Thickness: 271. Progression has worsened. Findings include normal foveal contour, intraretinal fluid, no SRF, vitreomacular adhesion  (Interval increase in IRF / edema).  Notes *Images captured and stored on drive  Diagnosis / Impression:  Mod NPDR w/ DME OU OD: Interval increase in IRF centrally, but interval decrease in IRF nasally OS: Interval increase in IRF / edema  Clinical management:  See below  Abbreviations: NFP - Normal foveal profile. CME - cystoid macular edema. PED - pigment epithelial detachment. IRF - intraretinal fluid. SRF - subretinal fluid. EZ - ellipsoid zone. ERM - epiretinal membrane. ORA - outer retinal atrophy. ORT - outer retinal tubulation. SRHM - subretinal hyper-reflective material. IRHM - intraretinal hyper-reflective material         Intravitreal Injection, Pharmacologic Agent - OD - Right Eye       Time Out 10/14/2020. 3:00 PM. Confirmed correct patient, procedure, site, and patient consented.   Anesthesia Topical anesthesia was used. Anesthetic medications included Lidocaine 2%, Proparacaine 0.5%.   Procedure Preparation included 5% betadine to ocular surface, eyelid speculum. A supplied needle was used.   Injection:  1.25 mg Bevacizumab (AVASTIN) 1.25mg /0.77mL SOLN   NDC: H061816, Lot: [id number], Expiration date: 11/30/2020   Route: Intravitreal, Site: Right Eye, Waste: 0 mL  Post-op Post injection exam found visual acuity of at least counting fingers. The patient tolerated the procedure well. There were no complications. The patient received written and verbal post procedure care education.                 ASSESSMENT/PLAN:    ICD-10-CM   1. Moderate nonproliferative diabetic retinopathy of both eyes with macular edema associated with type 2 diabetes mellitus (HCC)  J33.5456 Intravitreal Injection, Pharmacologic Agent - OD - Right Eye    Bevacizumab (AVASTIN) SOLN 1.25 mg  2. Retinal edema  H35.81 OCT, Retina - OU - Both Eyes  3. Lattice degeneration of both retinas  H35.413   4. Retinal holes, bilateral  H33.323   5. Essential hypertension  I10   6. Hypertensive retinopathy of both eyes  H35.033   7. Combined forms of age-related cataract of both eyes  H25.813   8. Dry eyes, bilateral  H04.123     1,2. Moderate non-proliferative diabetic retinopathy w/ DME OU  - Last A1c 6.7 on 10.19.21 - exam shows scattered cystic changes and scattered MA/IRH  OU - FA 09.15.21 shows late leaking MA OU, no NV OU  - OCT shows OD: Interval increase in IRF centrally, but interval decrease in IRF nasally; OS: Interval increase in IRF / edema - BCVA decreased to 20/25 OU from 20/20 - recommend IVA OU, but OD #1 first today, 02.23.22 - pt wishes to proceed with injection OD  - RBA of procedure discussed,  questions answered  - IVA informed consent obtained and signed, 02.23.22 (OD) - see procedure note - f/u Friday at 8:15, -- OCT, IVA OS #1  3,4. Lattice degeneration w/ atrophic holes, OU - patches of inferior lattice OU; OD with small patch at 12 - discussed findings, prognosis, and treatment options including observation - s/p laser retinopexy OD (09.29.21) -- good laser surrounding - s/p laser retinopexy OS (10.13.21) -- good laser surrounidng - monitor  5,6. Hypertensive retinopathy OU - discussed importance of tight BP control - monitor  7. Mixed Cataract OU - The symptoms of cataract, surgical options, and treatments and risks were discussed with patient. - discussed diagnosis and progression - not yet visually significant - monitor for now  8. Dry eyes OU - recommend artificial tears and lubricating ointment as needed   Ophthalmic Meds Ordered this visit:  Meds ordered this encounter  Medications  . Bevacizumab (AVASTIN) SOLN 1.25 mg       Return in about 2 days (around 10/16/2020) for OCT, Possible Injxn OS.  There are no Patient Instructions on file for this visit.   Explained the diagnoses, plan, and follow up with the patient and they expressed understanding.  Patient expressed understanding of the importance of proper follow up care.   This document serves as a record of services personally performed by Gardiner Sleeper, MD, PhD. It was created on their behalf by Roselee Nova, COMT. The creation of this record is the provider's dictation and/or activities during the visit.  Electronically signed by: Roselee Nova, COMT 10/14/20 4:44 PM  Gardiner Sleeper, M.D., Ph.D. Diseases & Surgery of the Retina and Vitreous [facility]  I have reviewed the above documentation for accuracy and completeness, and I agree with the above. Gardiner Sleeper, M.D., Ph.D. 10/14/20 4:44 PM  Abbreviations: M myopia (nearsighted); A astigmatism; H hyperopia  (farsighted); P presbyopia; Mrx spectacle prescription;  CTL contact lenses; OD right eye; OS left eye; OU both eyes  XT exotropia; ET esotropia; PEK punctate epithelial keratitis; PEE punctate epithelial erosions; DES dry eye syndrome; MGD meibomian gland dysfunction; ATs artificial tears; PFAT's preservative free artificial tears; Sunrise nuclear sclerotic cataract; PSC posterior subcapsular cataract; ERM epi-retinal membrane; PVD posterior vitreous detachment; RD retinal detachment; DM diabetes mellitus; DR diabetic retinopathy; NPDR non-proliferative diabetic retinopathy; PDR proliferative diabetic retinopathy; CSME clinically significant macular edema; DME diabetic macular edema; dbh dot blot hemorrhages; CWS cotton wool spot; POAG primary open angle glaucoma; C/D cup-to-disc ratio; HVF humphrey visual field; GVF goldmann visual field; OCT optical coherence tomography; IOP intraocular pressure; BRVO Branch retinal vein occlusion; CRVO central retinal vein occlusion; CRAO central retinal artery occlusion; BRAO branch retinal artery occlusion; RT retinal tear; SB scleral buckle; PPV pars plana vitrectomy; VH Vitreous hemorrhage; PRP panretinal laser photocoagulation; IVK intravitreal kenalog; VMT vitreomacular traction; MH Macular hole;  NVD neovascularization of the disc; NVE neovascularization elsewhere; AREDS age related eye disease study; ARMD age related macular degeneration; POAG primary open angle glaucoma; EBMD epithelial/anterior basement membrane dystrophy; ACIOL anterior chamber intraocular lens; IOL intraocular lens; PCIOL posterior chamber intraocular lens; Phaco/IOL phacoemulsification with intraocular lens placement; Straughn photorefractive keratectomy; LASIK laser assisted in situ keratomileusis; HTN hypertension; DM diabetes mellitus; COPD chronic obstructive pulmonary disease

## 2020-10-14 ENCOUNTER — Ambulatory Visit (INDEPENDENT_AMBULATORY_CARE_PROVIDER_SITE_OTHER): Payer: Medicare Other | Admitting: Ophthalmology

## 2020-10-14 ENCOUNTER — Encounter (INDEPENDENT_AMBULATORY_CARE_PROVIDER_SITE_OTHER): Payer: Self-pay | Admitting: Ophthalmology

## 2020-10-14 ENCOUNTER — Other Ambulatory Visit: Payer: Self-pay

## 2020-10-14 DIAGNOSIS — H35413 Lattice degeneration of retina, bilateral: Secondary | ICD-10-CM | POA: Diagnosis not present

## 2020-10-14 DIAGNOSIS — H25813 Combined forms of age-related cataract, bilateral: Secondary | ICD-10-CM | POA: Diagnosis not present

## 2020-10-14 DIAGNOSIS — H33323 Round hole, bilateral: Secondary | ICD-10-CM | POA: Diagnosis not present

## 2020-10-14 DIAGNOSIS — E113313 Type 2 diabetes mellitus with moderate nonproliferative diabetic retinopathy with macular edema, bilateral: Secondary | ICD-10-CM

## 2020-10-14 DIAGNOSIS — H04123 Dry eye syndrome of bilateral lacrimal glands: Secondary | ICD-10-CM | POA: Diagnosis not present

## 2020-10-14 DIAGNOSIS — H3581 Retinal edema: Secondary | ICD-10-CM

## 2020-10-14 DIAGNOSIS — H35033 Hypertensive retinopathy, bilateral: Secondary | ICD-10-CM | POA: Diagnosis not present

## 2020-10-14 DIAGNOSIS — I10 Essential (primary) hypertension: Secondary | ICD-10-CM | POA: Diagnosis not present

## 2020-10-14 DIAGNOSIS — E083313 Diabetes mellitus due to underlying condition with moderate nonproliferative diabetic retinopathy with macular edema, bilateral: Secondary | ICD-10-CM

## 2020-10-14 MED ORDER — BEVACIZUMAB CHEMO INJECTION 1.25MG/0.05ML SYRINGE FOR KALEIDOSCOPE
1.2500 mg | INTRAVITREAL | Status: AC | PRN
  Administered 2020-10-14: 1.25 mg via INTRAVITREAL

## 2020-10-15 ENCOUNTER — Telehealth: Payer: Self-pay | Admitting: Gastroenterology

## 2020-10-15 NOTE — Telephone Encounter (Signed)
Please schedule EGD with Dr. Gala Romney with conscious sedation. ASA III. Dx: chronic GERD, IDA

## 2020-10-15 NOTE — Telephone Encounter (Signed)
Please let patient know that I reviewed labs from her nephrologist Dr. Theador Hawthorne.  Labs dated 09/23/2020.  Hemoglobin 8.8, hematocrit 26.8 stable.  TIBC 268, iron saturations 46%, iron 122, ferritin 1435.  Hemoglobin slightly improved from January.  Iron slightly improved.  According to Dr. Toya Smothers last office note, patient not at goal for hemoglobin.  Missed her EPO shot.  Last month she was not interested in pursuing EGD for IDA. Please find out if she prefers to continue to monitor labs or pursue EGD.

## 2020-10-15 NOTE — Telephone Encounter (Signed)
Spoke with pt was notified of lab results and wants to move forward with the EGD.

## 2020-10-15 NOTE — Telephone Encounter (Signed)
Lmom, waiting on a return call.  

## 2020-10-15 NOTE — Progress Notes (Signed)
[facility] Note  10/16/2020     CHIEF COMPLAINT Patient presents for Retina Follow Up   HISTORY OF PRESENT ILLNESS: Cindy Park is a 65 y.o. female who presents to the clinic today for:   HPI    Retina Follow Up    Patient presents with  Diabetic Retinopathy.  In both eyes.  This started days ago.  Severity is moderate.  Duration of days.  Since onset it is stable.  I, the attending physician,  performed the HPI with the patient and updated documentation appropriately.          Comments    Pt states vision is the same OU.  Pt denies any new or worsening floaters or fol OU.       Last edited by Bernarda Caffey, MD on 10/16/2020  8:55 AM. (History)    pt here for IVA OS #1 today   Referring physician: Leticia Clas OD [address] [place], [place] [address]  HISTORICAL INFORMATION:   Selected notes from the MEDICAL RECORD NUMBER Referred by Dr. Leticia Clas for DM eval LEE: 08.19.2021 BCVA OD: 20/20 OS: 20/20 Ocular Hx- cataract, CWS, macular edema PMH- DM, HTN, stage 3 kidney failure, CHF   CURRENT MEDICATIONS: No current outpatient medications on file. (Ophthalmic Drugs)   No current facility-administered medications for this visit. (Ophthalmic Drugs)   Current Outpatient Medications (Other)  Medication Sig  . acetaminophen (TYLENOL) 500 MG tablet Take 1,000 mg by mouth every 6 (six) hours as needed for moderate pain.   Marland Kitchen albuterol (PROVENTIL) (2.5 MG/3ML) 0.083% nebulizer solution Take 2.5 mg by nebulization 4 (four) times daily.   Marland Kitchen albuterol (VENTOLIN HFA) 108 (90 Base) MCG/ACT inhaler Inhale 2 puffs into the lungs every 6 (six) hours as needed for wheezing or shortness of breath.  Marland Kitchen amLODipine (NORVASC) 5 MG tablet Take 10 mg by mouth 2 (two) times daily.   Marland Kitchen aspirin EC 81 MG tablet Take 81 mg by mouth daily.  Marland Kitchen atorvastatin (LIPITOR) 40 MG tablet Take 40 mg by mouth at bedtime.   . calcitRIOL (ROCALTROL) 0.25 MCG capsule  Take 0.25 mcg by mouth daily.  . carvedilol (COREG) 25 MG tablet Take 25 mg by mouth 2 (two) times daily with a meal.   . Cholecalciferol (VITAMIN D) 125 MCG (5000 UT) CAPS Take 5,000 Units by mouth in the morning and at bedtime.   . clonazePAM (KLONOPIN) 0.5 MG tablet Take 0.5 mg by mouth daily as needed for anxiety.   . dicyclomine (BENTYL) 10 MG capsule Take 10 mg by mouth 2 (two) times daily.   Marland Kitchen epoetin alfa (EPOGEN) 3000 UNIT/ML injection Inject 3,000 Units into the skin every 14 (fourteen) days.   . ferrous sulfate 325 (65 FE) MG tablet Take 325 mg by mouth 2 (two) times daily with a meal.  . Fluticasone-Umeclidin-Vilant (TRELEGY ELLIPTA) 100-62.5-25 MCG/INH AEPB Inhale 1 puff into the lungs daily.  . furosemide (LASIX) 80 MG tablet Take 160 mg by mouth 2 (two) times daily.   Marland Kitchen gabapentin (NEURONTIN) 300 MG capsule Take 600 mg by mouth 2 (two) times daily.   Marland Kitchen gemfibrozil (LOPID) 600 MG tablet Take 600 mg by mouth 2 (two) times daily before a meal.  . Insulin Lispro Prot & Lispro (HUMALOG MIX 75/25 [place]) Inject 15-60 Units into the skin See admin instructions. Inject 60 units into the skin in the morning and 15 units at lunch and 60 units  at supper  . levothyroxine (SYNTHROID) 50 MCG tablet Take 50 mcg by mouth daily before breakfast.   . montelukast (SINGULAIR) 10 MG tablet Take 10 mg by mouth at bedtime.  Marland Kitchen omeprazole (PRILOSEC) 20 MG capsule Take 20 mg by mouth daily.   Marland Kitchen oxyCODONE-acetaminophen (PERCOCET) 5-325 MG tablet Take 1 tablet by mouth every 6 (six) hours as needed for severe pain.  . sitaGLIPtin (JANUVIA) 25 MG tablet Take 25 mg by mouth daily.  . sodium bicarbonate 650 MG tablet Take 650 mg by mouth 2 (two) times daily.  Marland Kitchen triamcinolone (KENALOG) 0.1 % Apply 1 application topically daily. To feet  . vitamin B-12 (CYANOCOBALAMIN) 1000 MCG tablet Take 1,000 mcg by mouth daily.   . vitamin E 180 MG (400 UNITS) capsule Take 400 Units by mouth daily.   No current  facility-administered medications for this visit. (Other)      REVIEW OF SYSTEMS: ROS    Positive for: Gastrointestinal, Endocrine, Eyes, Respiratory   Negative for: Constitutional, Neurological, Skin, Genitourinary, Musculoskeletal, HENT, Cardiovascular, Psychiatric, Allergic/Imm, Heme/Lymph   Last edited by Doneen Poisson on 10/16/2020  8:12 AM. (History)       ALLERGIES Allergies  Allergen Reactions  . Latex Shortness Of Breath and Rash    With powder   . Niacin Other (See Comments)    Burning  . Other Dermatitis    Powder in the gloves   . Tomato Other (See Comments)    Breaks pt out   . Bactrim [Sulfamethoxazole-Trimethoprim] Swelling and Rash    PAST MEDICAL HISTORY Past Medical History:  Diagnosis Date  . Anemia   . Arthritis   . Asthma   . Cataract    Combined form OU  . COPD (chronic obstructive pulmonary disease) ([place])   . Diabetes mellitus without complication ([place])   . Diabetic retinopathy ([place])    BDR OU  . Family history of adverse reaction to anesthesia    sister- difficult to wake up from anesthesia  . Fluid excess   . GERD (gastroesophageal reflux disease)   . Goiter   . Hypertension   . Hypertensive retinopathy    OU  . Renal failure    Past Surgical History:  Procedure Laterality Date  . ABDOMINAL HYSTERECTOMY    . AV FISTULA PLACEMENT Left 06/11/2020   Procedure: LEFT ARM ARTERIOVENOUS (AV) FISTULA CREATION;  Surgeon: [name], MD;  Location: [facility];  Service: Vascular;  Laterality: Left;  . BASCILIC VEIN TRANSPOSITION Left 08/06/2020   Procedure: LEFT ARM SECOND STAGE BASCILIC VEIN TRANSPOSITION;  Surgeon: [name], MD;  Location: [facility];  Service: Vascular;  Laterality: Left;  . CHOLECYSTECTOMY    . COLONOSCOPY N/A 03/25/2020   [place]: two tubular adenomas removed. next colonoscopy five years  . POLYPECTOMY  03/25/2020   Procedure: POLYPECTOMY;  Surgeon: Daneil Dolin, MD;  Location: [facility] SUITE;  Service: Endoscopy;;     FAMILY HISTORY Family History  Problem Relation Age of Onset  . Kidney disease Mother        ESRD  . Kidney disease Sister        ESRD  . Colon polyps Brother 60  . Kidney disease Maternal Grandmother        ESRD  . Colon cancer Neg Hx     SOCIAL HISTORY Social History   Tobacco Use  . Smoking status: Never Smoker  . Smokeless tobacco: Never Used  Vaping Use  . Vaping Use: Never used  Substance  Use Topics  . Alcohol use: No  . Drug use: No         OPHTHALMIC EXAM:  Base Eye Exam    Visual Acuity (Snellen - Linear)      Right Left   Dist cc 20/30 -2 20/25 -2   Dist ph cc 20/25 -2 20/25   Correction: Glasses       Tonometry (Tonopen, 8:15 AM)      Right Left   Pressure def 15       Pupils      Dark Light Shape React APD   Right 3 2 Round Brisk 0   Left 3 2 Round Brisk 0       Visual Fields      Left Right    Full Full       Extraocular Movement      Right Left    Full Full       Neuro/Psych    Oriented x3: Yes   Mood/Affect: Normal       Dilation    Left eye: 1.0% Mydriacyl, 2.5% Phenylephrine @ 8:15 AM        Slit Lamp and Fundus Exam    Slit Lamp Exam      Right Left   Lids/Lashes Dermato, mild MGD Dermarto, mild MGD   Conjunctiva/Sclera Nasal pinguecula, mild melanosis, inferior Conjunctivochalasis Nasal pinguecula, mild melanosis, inferior Conjunctivochalasis   Cornea 1+PEE, trace arcus trace PEE, trace arcus   Anterior Chamber Deep and quiet Deep and quiet   Iris Round and dilated Round and dilated   Lens 2+ NS, 2-3+ cortical 2-3+ NS, 2-3+ cortical   Vitreous Synerisis Synerisis       Fundus Exam      Right Left   Disc Pink, sharp, Compact, focal PPP Pink, sharp, compact   C/D Ratio 0.1 0.2   Macula Blunted foveal reflex, mild rpe mottling, scattered cystic changes - increased, scattered MA / IRH Good foveal reflex, mild rpe mottling, scattered cystic changes - slightly increased, scattered MA   Vessels attenuated, mild  tortuousity attenuated, mild tortuousity, mild Copper wiring   Periphery Attached; Focal pigmented lattice with atrophic hole at 12 and in IT quad, greatest at 0730 -- good laser surrounding all patches of lattice, minimal MA Attached, Focal pigmented lattice from 0530-0700 -- good laser surrounding, minimal MA, No new RT/RD        Refraction    Wearing Rx      Sphere Cylinder Axis Add   Right [place] +0.50 045 +2.00   Left +0.05 +0.50 128 +2.00   Type: PAL          IMAGING AND PROCEDURES  Imaging and Procedures for 10/16/2020  OCT, Retina - OU - Both Eyes       Right Eye Quality was good. Central Foveal Thickness: 307. Progression has been stable. Findings include abnormal foveal contour, intraretinal fluid, no SRF (Persistent cystic changes centrally and IN mac).   Left Eye Quality was good. Central Foveal Thickness: 263. Progression has been stable. Findings include normal foveal contour, intraretinal fluid, no SRF, vitreomacular adhesion  (Persistent IRF superior macula).   Notes *Images captured and stored on drive  Diagnosis / Impression:  Mod NPDR w/ DME OU OD: Persistent cystic changes centrally and IN mac OS: Persistent IRF superior macula  Clinical management:  See below  Abbreviations: NFP - Normal foveal profile. CME - cystoid macular edema. PED - pigment epithelial detachment. IRF - intraretinal fluid. SRF -  subretinal fluid. EZ - ellipsoid zone. ERM - epiretinal membrane. ORA - outer retinal atrophy. ORT - outer retinal tubulation. SRHM - subretinal hyper-reflective material. IRHM - intraretinal hyper-reflective material        Intravitreal Injection, Pharmacologic Agent - OS - Left Eye       Time Out 10/16/2020. 8:21 AM. Confirmed correct patient, procedure, site, and patient consented.   Anesthesia Topical anesthesia was used. Anesthetic medications included Lidocaine 2%, Proparacaine 0.5%.   Procedure Preparation included 5% betadine to ocular  surface, eyelid speculum. A supplied needle was used.   Injection:  1.25 mg Bevacizumab (AVASTIN) 1.74m/0.05mL SOLN   NDC: 539030-092-33 Lot:: [id number] Expiration date: 11/26/2020   Route: Intravitreal, Site: Left Eye, Waste: 0.05 mL  Post-op Post injection exam found visual acuity of at least counting fingers. The patient tolerated the procedure well. There were no complications. The patient received written and verbal post procedure care education.                 ASSESSMENT/PLAN:    ICD-10-CM   1. Moderate nonproliferative diabetic retinopathy of both eyes with macular edema associated with type 2 diabetes mellitus (HCC)  EJ33.5456Intravitreal Injection, Pharmacologic Agent - OS - Left Eye    Bevacizumab (AVASTIN) SOLN 1.25 mg  2. Retinal edema  H35.81 OCT, Retina - OU - Both Eyes  3. Lattice degeneration of both retinas  H35.413   4. Retinal holes, bilateral  H33.323   5. Essential hypertension  I10   6. Hypertensive retinopathy of both eyes  H35.033   7. Combined forms of age-related cataract of both eyes  H25.813   8. Dry eyes, bilateral  H04.123     1,2. Moderate non-proliferative diabetic retinopathy w/ DME OU  - Last A1c 6.7 on 10.19.21 - exam shows scattered cystic changes and scattered MA/IRH  OU - FA 09.15.21 shows late leaking MA OU, no NV OU  - OCT shows OD: Interval increase in IRF centrally, but interval decrease in IRF nasally; OS: Interval increase in IRF / edema - BCVA stable at 20/25 OU  - s/p IVA OD #1 (02.23.22) - recommend IVA OS #1 today, 02.25.22 - pt wishes to proceed with injection OS  - RBA of procedure discussed, questions answered  - IVA informed consent obtained and signed, 02.23.22 (OD)  - IVA informed consent obtained and signed, 02.23.22 (OS) - see procedure note - f/u 4 weeks, DFE, OCT, possible injection(s)  3,4. Lattice degeneration w/ atrophic holes, OU - patches of inferior lattice OU; OD with small patch at 12 - discussed  findings, prognosis, and treatment options including observation - s/p laser retinopexy OD (09.29.21) -- good laser surrounding - s/p laser retinopexy OS (10.13.21) -- good laser surrounidng - monitor  5,6. Hypertensive retinopathy OU - discussed importance of tight BP control - monitor  7. Mixed Cataract OU - The symptoms of cataract, surgical options, and treatments and risks were discussed with patient. - discussed diagnosis and progression - not yet visually significant - monitor for now  8. Dry eyes OU - recommend artificial tears and lubricating ointment as needed   Ophthalmic Meds Ordered this visit:  Meds ordered this encounter  Medications  . Bevacizumab (AVASTIN) SOLN 1.25 mg       Return in about 4 weeks (around 11/13/2020) for f/u NPDR OU, DFE, OCT.  There are no Patient Instructions on file for this visit.   Explained the diagnoses, plan, and follow up with the patient and they expressed understanding.  Patient expressed understanding of the importance of proper follow up care.   This document serves as a record of services personally performed by Gardiner Sleeper, MD, PhD. It was created on their behalf by San Jetty. Owens Shark, OA an ophthalmic technician. The creation of this record is the provider's dictation and/or activities during the visit.    Electronically signed by: San Jetty. Owens Shark, [place] 02.24.2022 9:01 AM  Gardiner Sleeper, M.D., Ph.D. Diseases & Surgery of the Retina and Vitreous [facility]  I have reviewed the above documentation for accuracy and completeness, and I agree with the above. Gardiner Sleeper, M.D., Ph.D. 10/16/20 9:01 AM    Abbreviations: M myopia (nearsighted); A astigmatism; H hyperopia (farsighted); P presbyopia; Mrx spectacle prescription;  CTL contact lenses; OD right eye; OS left eye; OU both eyes  XT exotropia; ET esotropia; PEK punctate epithelial keratitis; PEE punctate epithelial erosions; DES dry eye  syndrome; MGD meibomian gland dysfunction; ATs artificial tears; PFAT's preservative free artificial tears; [place] nuclear sclerotic cataract; PSC posterior subcapsular cataract; ERM epi-retinal membrane; PVD posterior vitreous detachment; RD retinal detachment; DM diabetes mellitus; DR diabetic retinopathy; NPDR non-proliferative diabetic retinopathy; PDR proliferative diabetic retinopathy; CSME clinically significant macular edema; DME diabetic macular edema; dbh dot blot hemorrhages; CWS cotton wool spot; POAG primary open angle glaucoma; C/D cup-to-disc ratio; HVF humphrey visual field; GVF goldmann visual field; OCT optical coherence tomography; IOP intraocular pressure; BRVO Branch retinal vein occlusion; CRVO central retinal vein occlusion; CRAO central retinal artery occlusion; BRAO branch retinal artery occlusion; RT retinal tear; SB scleral buckle; PPV pars plana vitrectomy; VH Vitreous hemorrhage; PRP panretinal laser photocoagulation; IVK intravitreal kenalog; VMT vitreomacular traction; MH Macular hole;  NVD neovascularization of the disc; NVE neovascularization elsewhere; AREDS age related eye disease study; ARMD age related macular degeneration; POAG primary open angle glaucoma; EBMD epithelial/anterior basement membrane dystrophy; ACIOL anterior chamber intraocular lens; IOL intraocular lens; PCIOL posterior chamber intraocular lens; Phaco/IOL phacoemulsification with intraocular lens placement; [place] photorefractive keratectomy; LASIK laser assisted in situ keratomileusis; HTN hypertension; DM diabetes mellitus; COPD chronic obstructive pulmonary disease

## 2020-10-16 ENCOUNTER — Encounter (HOSPITAL_COMMUNITY)
Admission: RE | Admit: 2020-10-16 | Discharge: 2020-10-16 | Disposition: A | Discharge status: Home / Self Care | Payer: Medicare Other | Source: Ambulatory Visit | Attending: Nephrology | Admitting: Nephrology

## 2020-10-16 ENCOUNTER — Ambulatory Visit (HOSPITAL_COMMUNITY): Payer: Medicare Other

## 2020-10-16 ENCOUNTER — Encounter (HOSPITAL_COMMUNITY): Payer: Self-pay

## 2020-10-16 ENCOUNTER — Other Ambulatory Visit: Payer: Self-pay

## 2020-10-16 ENCOUNTER — Encounter (INDEPENDENT_AMBULATORY_CARE_PROVIDER_SITE_OTHER): Payer: Self-pay | Admitting: Ophthalmology

## 2020-10-16 ENCOUNTER — Ambulatory Visit (INDEPENDENT_AMBULATORY_CARE_PROVIDER_SITE_OTHER): Payer: Medicare Other | Admitting: Ophthalmology

## 2020-10-16 DIAGNOSIS — E113313 Type 2 diabetes mellitus with moderate nonproliferative diabetic retinopathy with macular edema, bilateral: Secondary | ICD-10-CM | POA: Diagnosis not present

## 2020-10-16 DIAGNOSIS — N184 Chronic kidney disease, stage 4 (severe): Secondary | ICD-10-CM | POA: Diagnosis not present

## 2020-10-16 DIAGNOSIS — I10 Essential (primary) hypertension: Secondary | ICD-10-CM

## 2020-10-16 DIAGNOSIS — H3581 Retinal edema: Secondary | ICD-10-CM

## 2020-10-16 DIAGNOSIS — H33323 Round hole, bilateral: Secondary | ICD-10-CM

## 2020-10-16 DIAGNOSIS — D631 Anemia in chronic kidney disease: Secondary | ICD-10-CM | POA: Diagnosis not present

## 2020-10-16 DIAGNOSIS — H35413 Lattice degeneration of retina, bilateral: Secondary | ICD-10-CM | POA: Diagnosis not present

## 2020-10-16 DIAGNOSIS — H35033 Hypertensive retinopathy, bilateral: Secondary | ICD-10-CM | POA: Diagnosis not present

## 2020-10-16 DIAGNOSIS — H25813 Combined forms of age-related cataract, bilateral: Secondary | ICD-10-CM

## 2020-10-16 DIAGNOSIS — H04123 Dry eye syndrome of bilateral lacrimal glands: Secondary | ICD-10-CM

## 2020-10-16 LAB — POCT HEMOGLOBIN-HEMACUE: Hemoglobin: 9.2 g/dL — ABNORMAL LOW (ref 12.0–15.0)

## 2020-10-16 MED ORDER — BEVACIZUMAB CHEMO INJECTION 1.25MG/0.05ML SYRINGE FOR KALEIDOSCOPE
1.2500 mg | INTRAVITREAL | Status: AC | PRN
  Administered 2020-10-16: 1.25 mg via INTRAVITREAL

## 2020-10-16 MED ORDER — EPOETIN ALFA 3000 UNIT/ML IJ SOLN: 3000.0000 [IU] | Freq: Once | INTRAMUSCULAR | Status: AC

## 2020-10-16 MED ORDER — EPOETIN ALFA 3000 UNIT/ML IJ SOLN
INTRAMUSCULAR | Status: AC
  Administered 2020-10-16: 3000 [IU] via SUBCUTANEOUS
  Filled 2020-10-16: qty 1

## 2020-10-16 NOTE — Telephone Encounter (Signed)
Patient returned call. She has been scheduled for 4/13 am appt. Also covid scheduled for 4/11 at 10:30am. Aware will mail instructions with appt.    PA approved via [facility]. Auth# [id number] DOS 12/02/2020-03/02/2021

## 2020-10-16 NOTE — Telephone Encounter (Signed)
Pt is aware that she will receive a call to schedule EGD.

## 2020-10-16 NOTE — Telephone Encounter (Signed)
Spoke to pt's husband. They were at another appt and will call back to schedule procedure.

## 2020-10-19 DIAGNOSIS — E039 Hypothyroidism, unspecified: Secondary | ICD-10-CM | POA: Diagnosis not present

## 2020-10-19 DIAGNOSIS — I10 Essential (primary) hypertension: Secondary | ICD-10-CM | POA: Diagnosis not present

## 2020-10-19 DIAGNOSIS — N183 Chronic kidney disease, stage 3 unspecified: Secondary | ICD-10-CM | POA: Diagnosis not present

## 2020-10-19 DIAGNOSIS — J441 Chronic obstructive pulmonary disease with (acute) exacerbation: Secondary | ICD-10-CM | POA: Diagnosis not present

## 2020-10-19 DIAGNOSIS — E1129 Type 2 diabetes mellitus with other diabetic kidney complication: Secondary | ICD-10-CM | POA: Diagnosis not present

## 2020-10-23 ENCOUNTER — Other Ambulatory Visit: Payer: Self-pay

## 2020-10-23 ENCOUNTER — Other Ambulatory Visit (HOSPITAL_COMMUNITY)
Admission: RE | Admit: 2020-10-23 | Discharge: 2020-10-23 | Disposition: A | Discharge status: Home / Self Care | Payer: Medicare Other | Source: Ambulatory Visit | Attending: Nephrology | Admitting: Nephrology

## 2020-10-23 DIAGNOSIS — D631 Anemia in chronic kidney disease: Secondary | ICD-10-CM | POA: Diagnosis not present

## 2020-10-23 DIAGNOSIS — I129 Hypertensive chronic kidney disease with stage 1 through stage 4 chronic kidney disease, or unspecified chronic kidney disease: Secondary | ICD-10-CM | POA: Diagnosis not present

## 2020-10-23 DIAGNOSIS — R809 Proteinuria, unspecified: Secondary | ICD-10-CM | POA: Diagnosis not present

## 2020-10-23 DIAGNOSIS — N189 Chronic kidney disease, unspecified: Secondary | ICD-10-CM | POA: Diagnosis not present

## 2020-10-23 DIAGNOSIS — N185 Chronic kidney disease, stage 5: Secondary | ICD-10-CM | POA: Diagnosis not present

## 2020-10-23 DIAGNOSIS — E211 Secondary hyperparathyroidism, not elsewhere classified: Secondary | ICD-10-CM | POA: Diagnosis not present

## 2020-10-23 DIAGNOSIS — N170 Acute kidney failure with tubular necrosis: Secondary | ICD-10-CM | POA: Diagnosis not present

## 2020-10-23 DIAGNOSIS — E1122 Type 2 diabetes mellitus with diabetic chronic kidney disease: Secondary | ICD-10-CM | POA: Diagnosis not present

## 2020-10-23 DIAGNOSIS — E1129 Type 2 diabetes mellitus with other diabetic kidney complication: Secondary | ICD-10-CM | POA: Diagnosis not present

## 2020-10-23 LAB — CBC
HCT: 30.2 % — ABNORMAL LOW (ref 36.0–46.0)
Hemoglobin: 9.4 g/dL — ABNORMAL LOW (ref 12.0–15.0)
MCH: 27 pg (ref 26.0–34.0)
MCHC: 31.1 g/dL (ref 30.0–36.0)
MCV: 86.8 fL (ref 80.0–100.0)
Platelets: 267 10*3/uL (ref 150–400)
RBC: 3.48 MIL/uL — ABNORMAL LOW (ref 3.87–5.11)
RDW: 14.6 % (ref 11.5–15.5)
WBC: 7.6 10*3/uL (ref 4.0–10.5)
nRBC: 0 % (ref 0.0–0.2)

## 2020-10-23 LAB — RENAL FUNCTION PANEL
Albumin: 3.9 g/dL (ref 3.5–5.0)
Anion gap: 14 (ref 5–15)
BUN: 54 mg/dL — ABNORMAL HIGH (ref 8–23)
CO2: 19 mmol/L — ABNORMAL LOW (ref 22–32)
Calcium: 9 mg/dL (ref 8.9–10.3)
Chloride: 107 mmol/L (ref 98–111)
Creatinine, Ser: 4.95 mg/dL — ABNORMAL HIGH (ref 0.44–1.00)
GFR, Estimated: 9 mL/min — ABNORMAL LOW (ref >60)
Glucose, Bld: 114 mg/dL — ABNORMAL HIGH (ref 70–99)
Phosphorus: 5.6 mg/dL — ABNORMAL HIGH (ref 2.5–4.6)
Potassium: 3.8 mmol/L (ref 3.5–5.1)
Sodium: 140 mmol/L (ref 135–145)

## 2020-10-23 LAB — PROTEIN / CREATININE RATIO, URINE
Creatinine, Urine: 55.72 mg/dL
Protein Creatinine Ratio: 3.95 mg/mg{Cre} — ABNORMAL HIGH (ref 0.00–0.15)
Total Protein, Urine: 220 mg/dL

## 2020-10-30 ENCOUNTER — Other Ambulatory Visit: Payer: Self-pay

## 2020-10-30 ENCOUNTER — Encounter (HOSPITAL_COMMUNITY)
Admission: RE | Admit: 2020-10-30 | Discharge: 2020-10-30 | Disposition: A | Discharge status: Home / Self Care | Payer: Medicare Other | Source: Ambulatory Visit | Attending: Nephrology | Admitting: Nephrology

## 2020-10-30 ENCOUNTER — Encounter (HOSPITAL_COMMUNITY): Payer: Self-pay

## 2020-10-30 DIAGNOSIS — D631 Anemia in chronic kidney disease: Secondary | ICD-10-CM | POA: Insufficient documentation

## 2020-10-30 DIAGNOSIS — N184 Chronic kidney disease, stage 4 (severe): Secondary | ICD-10-CM | POA: Diagnosis not present

## 2020-10-30 LAB — POCT HEMOGLOBIN-HEMACUE: Hemoglobin: 8.3 g/dL — ABNORMAL LOW (ref 12.0–15.0)

## 2020-10-30 LAB — CBC
HCT: 26.5 % — ABNORMAL LOW (ref 36.0–46.0)
Hemoglobin: 8.3 g/dL — ABNORMAL LOW (ref 12.0–15.0)
MCH: 27.3 pg (ref 26.0–34.0)
MCHC: 31.3 g/dL (ref 30.0–36.0)
MCV: 87.2 fL (ref 80.0–100.0)
Platelets: 243 10*3/uL (ref 150–400)
RBC: 3.04 MIL/uL — ABNORMAL LOW (ref 3.87–5.11)
RDW: 14.8 % (ref 11.5–15.5)
WBC: 7.2 10*3/uL (ref 4.0–10.5)
nRBC: 0 % (ref 0.0–0.2)

## 2020-10-30 LAB — RENAL FUNCTION PANEL
Albumin: 3.4 g/dL — ABNORMAL LOW (ref 3.5–5.0)
Anion gap: 13 (ref 5–15)
BUN: 57 mg/dL — ABNORMAL HIGH (ref 8–23)
CO2: 19 mmol/L — ABNORMAL LOW (ref 22–32)
Calcium: 8.6 mg/dL — ABNORMAL LOW (ref 8.9–10.3)
Chloride: 103 mmol/L (ref 98–111)
Creatinine, Ser: 4.81 mg/dL — ABNORMAL HIGH (ref 0.44–1.00)
GFR, Estimated: 10 mL/min — ABNORMAL LOW (ref >60)
Glucose, Bld: 113 mg/dL — ABNORMAL HIGH (ref 70–99)
Phosphorus: 5.7 mg/dL — ABNORMAL HIGH (ref 2.5–4.6)
Potassium: 4.2 mmol/L (ref 3.5–5.1)
Sodium: 135 mmol/L (ref 135–145)

## 2020-10-30 LAB — VITAMIN D 25 HYDROXY (VIT D DEFICIENCY, FRACTURES): Vit D, 25-Hydroxy: 39.19 ng/mL (ref 30–100)

## 2020-10-30 LAB — PROTEIN / CREATININE RATIO, URINE
Creatinine, Urine: 56.66 mg/dL
Protein Creatinine Ratio: 4.85 mg/mg{Cre} — ABNORMAL HIGH (ref 0.00–0.15)
Total Protein, Urine: 275 mg/dL

## 2020-10-30 LAB — IRON AND TIBC
Iron: 89 ug/dL (ref 28–170)
Saturation Ratios: 29 % (ref 10.4–31.8)
TIBC: 311 ug/dL (ref 250–450)
UIBC: 222 ug/dL

## 2020-10-30 LAB — HEPATITIS B SURFACE ANTIGEN: Hepatitis B Surface Ag: NONREACTIVE

## 2020-10-30 LAB — HEPATITIS B CORE ANTIBODY, IGM: Hep B C IgM: NONREACTIVE

## 2020-10-30 LAB — FERRITIN: Ferritin: 1408 ng/mL — ABNORMAL HIGH (ref 11–307)

## 2020-10-30 LAB — HEPATITIS C ANTIBODY: HCV Ab: NONREACTIVE

## 2020-10-30 MED ORDER — EPOETIN ALFA 3000 UNIT/ML IJ SOLN
3000.0000 [IU] | Freq: Once | INTRAMUSCULAR | Status: AC
  Administered 2020-10-30: 3000 [IU] via SUBCUTANEOUS
  Filled 2020-10-30: qty 1

## 2020-10-31 LAB — PARATHYROID HORMONE, INTACT (NO CA): PTH: 191 pg/mL — ABNORMAL HIGH (ref 15–65)

## 2020-10-31 LAB — HEPATITIS B SURFACE ANTIBODY, QUANTITATIVE: Hep B S AB Quant (Post): 4 m[IU]/mL — ABNORMAL LOW (ref >9.9)

## 2020-11-01 LAB — QUANTIFERON-TB GOLD PLUS: QuantiFERON-TB Gold Plus: NEGATIVE

## 2020-11-01 LAB — QUANTIFERON-TB GOLD PLUS (RQFGPL)
QuantiFERON Mitogen Value: >10 IU/mL
QuantiFERON Nil Value: 0.03 IU/mL
QuantiFERON TB1 Ag Value: 0.03 IU/mL
QuantiFERON TB2 Ag Value: 0.02 IU/mL

## 2020-11-05 ENCOUNTER — Other Ambulatory Visit: Payer: Self-pay

## 2020-11-05 ENCOUNTER — Other Ambulatory Visit (HOSPITAL_COMMUNITY): Payer: Self-pay | Admitting: Nephrology

## 2020-11-05 ENCOUNTER — Ambulatory Visit (HOSPITAL_COMMUNITY)
Admission: RE | Admit: 2020-11-05 | Discharge: 2020-11-05 | Disposition: A | Discharge status: Home / Self Care | Payer: Medicare Other | Source: Ambulatory Visit | Attending: Nephrology | Admitting: Nephrology

## 2020-11-05 DIAGNOSIS — E1129 Type 2 diabetes mellitus with other diabetic kidney complication: Secondary | ICD-10-CM | POA: Diagnosis not present

## 2020-11-05 DIAGNOSIS — R059 Cough, unspecified: Secondary | ICD-10-CM | POA: Diagnosis not present

## 2020-11-05 DIAGNOSIS — N185 Chronic kidney disease, stage 5: Secondary | ICD-10-CM | POA: Insufficient documentation

## 2020-11-05 DIAGNOSIS — R0602 Shortness of breath: Secondary | ICD-10-CM | POA: Diagnosis not present

## 2020-11-05 DIAGNOSIS — I517 Cardiomegaly: Secondary | ICD-10-CM | POA: Diagnosis not present

## 2020-11-05 DIAGNOSIS — E1122 Type 2 diabetes mellitus with diabetic chronic kidney disease: Secondary | ICD-10-CM | POA: Diagnosis not present

## 2020-11-05 DIAGNOSIS — E211 Secondary hyperparathyroidism, not elsewhere classified: Secondary | ICD-10-CM | POA: Diagnosis not present

## 2020-11-05 DIAGNOSIS — N189 Chronic kidney disease, unspecified: Secondary | ICD-10-CM | POA: Diagnosis not present

## 2020-11-10 NOTE — Progress Notes (Signed)
[facility] Note  11/13/2020     CHIEF COMPLAINT Patient presents for Retina Follow Up   HISTORY OF PRESENT ILLNESS: Cindy Park is a 65 y.o. female who presents to the clinic today for:   HPI    Retina Follow Up    Patient presents with  Diabetic Retinopathy.  In both eyes.  This started 4 weeks ago.  I, the attending physician,  performed the HPI with the patient and updated documentation appropriately.          Comments    Patient here for 4 weeks retina follow up for NPDR OU. Patient states vision doing pretty good. No eye pain.        Last edited by Bernarda Caffey, MD on 11/13/2020 11:34 AM. (History)    Pt reports improved vision OU. No issues with first injections   Referring physician: Leticia Clas OD [facility] [address], [place] [address]  HISTORICAL INFORMATION:   Selected notes from the MEDICAL RECORD NUMBER Referred by Dr. Leticia Clas for DM eval LEE: 08.19.2021 BCVA OD: 20/20 OS: 20/20 Ocular Hx- cataract, CWS, macular edema PMH- DM, HTN, stage 3 kidney failure, CHF   CURRENT MEDICATIONS: No current outpatient medications on file. (Ophthalmic Drugs)   No current facility-administered medications for this visit. (Ophthalmic Drugs)   Current Outpatient Medications (Other)  Medication Sig  . acetaminophen (TYLENOL) 500 MG tablet Take 1,000 mg by mouth every 6 (six) hours as needed for moderate pain.   Marland Kitchen albuterol (PROVENTIL) (2.5 MG/3ML) 0.083% nebulizer solution Take 2.5 mg by nebulization 4 (four) times daily.   Marland Kitchen albuterol (VENTOLIN HFA) 108 (90 Base) MCG/ACT inhaler Inhale 2 puffs into the lungs every 6 (six) hours as needed for wheezing or shortness of breath.  Marland Kitchen amLODipine (NORVASC) 5 MG tablet Take 10 mg by mouth 2 (two) times daily.   Marland Kitchen aspirin EC 81 MG tablet Take 81 mg by mouth daily.  Marland Kitchen atorvastatin (LIPITOR) 40 MG tablet Take 40 mg by mouth at bedtime.   . calcitRIOL (ROCALTROL) 0.25 MCG capsule Take 0.25  mcg by mouth daily.  . carvedilol (COREG) 25 MG tablet Take 25 mg by mouth 2 (two) times daily with a meal.   . Cholecalciferol (VITAMIN D) 125 MCG (5000 UT) CAPS Take 5,000 Units by mouth in the morning and at bedtime.   . clonazePAM (KLONOPIN) 0.5 MG tablet Take 0.5 mg by mouth daily as needed for anxiety.   . dicyclomine (BENTYL) 10 MG capsule Take 10 mg by mouth 2 (two) times daily.   Marland Kitchen epoetin alfa (EPOGEN) 3000 UNIT/ML injection Inject 3,000 Units into the skin every 14 (fourteen) days.   . ferrous sulfate 325 (65 FE) MG tablet Take 325 mg by mouth 2 (two) times daily with a meal.  . Fluticasone-Umeclidin-Vilant (TRELEGY ELLIPTA) 100-62.5-25 MCG/INH AEPB Inhale 1 puff into the lungs daily.  . furosemide (LASIX) 80 MG tablet Take 160 mg by mouth 2 (two) times daily.   Marland Kitchen gabapentin (NEURONTIN) 300 MG capsule Take 600 mg by mouth 2 (two) times daily.   Marland Kitchen gemfibrozil (LOPID) 600 MG tablet Take 600 mg by mouth 2 (two) times daily before a meal.  . Insulin Lispro Prot & Lispro (HUMALOG MIX 75/25 [place]) Inject 15-60 Units into the skin See admin instructions. Inject 60 units into the skin in the morning and 15 units at lunch and 60 units at supper  . levothyroxine (SYNTHROID) 50 MCG  tablet Take 50 mcg by mouth daily before breakfast.   . montelukast (SINGULAIR) 10 MG tablet Take 10 mg by mouth at bedtime.  Marland Kitchen omeprazole (PRILOSEC) 20 MG capsule Take 20 mg by mouth daily.   Marland Kitchen oxyCODONE-acetaminophen (PERCOCET) 5-325 MG tablet Take 1 tablet by mouth every 6 (six) hours as needed for severe pain.  . sitaGLIPtin (JANUVIA) 25 MG tablet Take 25 mg by mouth daily.  . sodium bicarbonate 650 MG tablet Take 650 mg by mouth 2 (two) times daily.  Marland Kitchen triamcinolone (KENALOG) 0.1 % Apply 1 application topically daily. To feet  . vitamin B-12 (CYANOCOBALAMIN) 1000 MCG tablet Take 1,000 mcg by mouth daily.   . vitamin E 180 MG (400 UNITS) capsule Take 400 Units by mouth daily.   No current facility-administered  medications for this visit. (Other)      REVIEW OF SYSTEMS: ROS    Positive for: Gastrointestinal, Endocrine, Eyes, Respiratory   Negative for: Constitutional, Neurological, Skin, Genitourinary, Musculoskeletal, HENT, Cardiovascular, Psychiatric, Allergic/Imm, Heme/Lymph   Last edited by Theodore Demark, COA on 11/13/2020 10:10 AM. (History)       ALLERGIES Allergies  Allergen Reactions  . Latex Shortness Of Breath and Rash    With powder   . Niacin Other (See Comments)    Burning  . Other Dermatitis    Powder in the gloves   . Tomato Other (See Comments)    Breaks pt out   . Bactrim [Sulfamethoxazole-Trimethoprim] Swelling and Rash    PAST MEDICAL HISTORY Past Medical History:  Diagnosis Date  . Anemia   . Arthritis   . Asthma   . Cataract    Combined form OU  . COPD (chronic obstructive pulmonary disease) ([place])   . Diabetes mellitus without complication ([place])   . Diabetic retinopathy ([place])    BDR OU  . Family history of adverse reaction to anesthesia    sister- difficult to wake up from anesthesia  . Fluid excess   . GERD (gastroesophageal reflux disease)   . Goiter   . Hypertension   . Hypertensive retinopathy    OU  . Renal failure    Past Surgical History:  Procedure Laterality Date  . ABDOMINAL HYSTERECTOMY    . AV FISTULA PLACEMENT Left 06/11/2020   Procedure: LEFT ARM ARTERIOVENOUS (AV) FISTULA CREATION;  Surgeon: [name], MD;  Location: [facility];  Service: Vascular;  Laterality: Left;  . BASCILIC VEIN TRANSPOSITION Left 08/06/2020   Procedure: LEFT ARM SECOND STAGE BASCILIC VEIN TRANSPOSITION;  Surgeon: [name], MD;  Location: [facility];  Service: Vascular;  Laterality: Left;  . CHOLECYSTECTOMY    . COLONOSCOPY N/A 03/25/2020   [place]: two tubular adenomas removed. next colonoscopy five years  . POLYPECTOMY  03/25/2020   Procedure: POLYPECTOMY;  Surgeon: Daneil Dolin, MD;  Location: [facility] ENDO SUITE;  Service: Endoscopy;;    FAMILY  HISTORY Family History  Problem Relation Age of Onset  . Kidney disease Mother        ESRD  . Kidney disease Sister        ESRD  . Colon polyps Brother 75  . Kidney disease Maternal Grandmother        ESRD  . Colon cancer Neg Hx     SOCIAL HISTORY Social History   Tobacco Use  . Smoking status: Never Smoker  . Smokeless tobacco: Never Used  Vaping Use  . Vaping Use: Never used  Substance Use Topics  . Alcohol use: No  .  Drug use: No         OPHTHALMIC EXAM:  Base Eye Exam    Visual Acuity (Snellen - Linear)      Right Left   Dist cc 20/25 -2 20/40 -2   Dist ph cc NI 20/20   Correction: Glasses       Tonometry (Tonopen, 10:08 AM)      Right Left   Pressure 22 21       Pupils      Dark Light Shape React APD   Right 3 2 Round Brisk None   Left 3 2 Round Brisk None       Visual Fields (Counting fingers)      Left Right    Full Full       Extraocular Movement      Right Left    Full Full       Neuro/Psych    Oriented x3: Yes   Mood/Affect: Normal       Dilation    Both eyes: 1.0% Mydriacyl, 2.5% Phenylephrine @ 10:08 AM        Slit Lamp and Fundus Exam    Slit Lamp Exam      Right Left   Lids/Lashes Dermato, mild MGD Dermarto, mild MGD   Conjunctiva/Sclera Nasal pinguecula, mild melanosis, inferior Conjunctivochalasis Nasal pinguecula, mild melanosis, inferior Conjunctivochalasis   Cornea 1+PEE, trace arcus trace PEE, trace arcus   Anterior Chamber Deep and quiet Deep and quiet   Iris Round and dilated Round and dilated   Lens 2+ NS, 2-3+ cortical 2-3+ NS, 2-3+ cortical   Vitreous Synerisis Synerisis       Fundus Exam      Right Left   Disc Pink, sharp, Compact, focal PPP Pink, sharp, compact   C/D Ratio 0.1 0.2   Macula Blunted foveal reflex, mild rpe mottling, scattered cystic changes - persistent centrally, slightly improved IN, scattered MA / IRH Good foveal reflex, mild rpe mottling, scattered cystic changes - persistent, scattered  MA, new focal CWS SN mac just inside ST arcades   Vessels attenuated, mild tortuousity attenuated, mild tortuousity, mild Copper wiring   Periphery Attached; punctate CWS nasal to disc, Focal pigmented lattice with atrophic hole at 12 and in IT quad, greatest at 0730 -- good laser surrounding all patches of lattice, minimal MA, No new RT/RD/lattice Attached, Focal pigmented lattice from 0530-0700 -- good laser surrounding, minimal MA, No new RT/RD/lattice        Refraction    Wearing Rx      Sphere Cylinder Axis Add   Right [place] +0.50 045 +2.00   Left +0.05 +0.50 128 +2.00   Type: PAL          IMAGING AND PROCEDURES  Imaging and Procedures for 11/13/2020  OCT, Retina - OU - Both Eyes       Right Eye Quality was good. Central Foveal Thickness: 318. Progression has improved. Findings include abnormal foveal contour, intraretinal fluid, no SRF (Stable IRF centrally, interval improvement in IRF IN macula).   Left Eye Quality was good. Central Foveal Thickness: 263. Progression has been stable. Findings include normal foveal contour, intraretinal fluid, no SRF, vitreomacular adhesion  (Persistent IRF superior macula, ?slightly increased).   Notes *Images captured and stored on drive  Diagnosis / Impression:  Mod NPDR w/ DME OU OD: Stable IRF centrally, interval improvement in IRF IN macula OS: Persistent IRF superior macula, ?slightly increased  Clinical management:  See below  Abbreviations: NFP -  Normal foveal profile. CME - cystoid macular edema. PED - pigment epithelial detachment. IRF - intraretinal fluid. SRF - subretinal fluid. EZ - ellipsoid zone. ERM - epiretinal membrane. ORA - outer retinal atrophy. ORT - outer retinal tubulation. SRHM - subretinal hyper-reflective material. IRHM - intraretinal hyper-reflective material        Intravitreal Injection, Pharmacologic Agent - OD - Right Eye       Time Out 11/13/2020. 10:40 AM. Confirmed correct patient, procedure,  site, and patient consented.   Anesthesia Topical anesthesia was used. Anesthetic medications included Lidocaine 2%, Proparacaine 0.5%.   Procedure Preparation included 5% betadine to ocular surface, eyelid speculum. A supplied needle was used.   Injection:  1.25 mg Bevacizumab (AVASTIN) 1.66m/0.05mL SOLN   NDC: 559935-701-77 Lot:: [id number] Expiration date: 12/26/2020   Route: Intravitreal, Site: Right Eye, Waste: 0 mL  Post-op Post injection exam found visual acuity of at least counting fingers. The patient tolerated the procedure well. There were no complications. The patient received written and verbal post procedure care education.        Intravitreal Injection, Pharmacologic Agent - OS - Left Eye       Time Out 11/13/2020. 10:40 AM. Confirmed correct patient, procedure, site, and patient consented.   Anesthesia Topical anesthesia was used. Anesthetic medications included Lidocaine 2%, Proparacaine 0.5%.   Procedure Preparation included 5% betadine to ocular surface, eyelid speculum. A (32g) needle was used.   Injection:  1.25 mg Bevacizumab (AVASTIN) 1.220m0.05mL SOLN   NDC: 5092330-076-22Lot: [id number], Expiration date: 12/29/2020   Route: Intravitreal, Site: Left Eye, Waste: 0.05 mL  Post-op Post injection exam found visual acuity of at least counting fingers. The patient tolerated the procedure well. There were no complications. The patient received written and verbal post procedure care education.                 ASSESSMENT/PLAN:    ICD-10-CM   1. Moderate nonproliferative diabetic retinopathy of both eyes with macular edema associated with type 2 diabetes mellitus (HCC)  E1Q33.3545ntravitreal Injection, Pharmacologic Agent - OD - Right Eye    Intravitreal Injection, Pharmacologic Agent - OS - Left Eye    Bevacizumab (AVASTIN) SOLN 1.25 mg    Bevacizumab (AVASTIN) SOLN 1.25 mg  2. Retinal edema  H35.81 OCT, Retina - OU - Both Eyes  3. Lattice degeneration of  both retinas  H35.413   4. Retinal holes, bilateral  H33.323   5. Essential hypertension  I10   6. Hypertensive retinopathy of both eyes  H35.033   7. Combined forms of age-related cataract of both eyes  H25.813   8. Dry eyes, bilateral  H04.123     1,2. Moderate non-proliferative diabetic retinopathy w/ DME OU  - Last A1c 6.7 on 10.19.21 - exam shows scattered cystic changes and scattered MA/IRH  OU - FA 09.15.21 shows late leaking MA OU, no NV OU  - OCT shows OD: Stable IRF centrally, interval improvement in IRF IN macula; OS: Persistent IRF superior macula, ?slightly increased - BCVA stable at 20/25 OD, improved to 20/20 OS  - s/p IVA OD #1 (02.23.22) - s/p IVA OS #1 (02.25.22) - recommend IVA OU #2 today, 03.25.22 - pt wishes to proceed with injections OU  - RBA of procedure discussed, questions answered  - IVA informed consent obtained and signed, 02.23.22 (OD)  - IVA informed consent obtained and signed, 02.23.22 (OS) - see procedure note - f/u 4 weeks, DFE, OCT, possible injection(s)  3,4. Lattice degeneration w/  atrophic holes, OU - patches of inferior lattice OU; OD with small patch at 12 - discussed findings, prognosis, and treatment options including observation - s/p laser retinopexy OD (09.29.21) -- good laser surrounding - s/p laser retinopexy OS (10.13.21) -- good laser surrounidng - monitor  5,6. Hypertensive retinopathy OU - discussed importance of tight BP control - monitor  7. Mixed Cataract OU - The symptoms of cataract, surgical options, and treatments and risks were discussed with patient. - discussed diagnosis and progression - not yet visually significant - monitor for now  8. Dry eyes OU - recommend artificial tears and lubricating ointment as needed   Ophthalmic Meds Ordered this visit:  Meds ordered this encounter  Medications  . Bevacizumab (AVASTIN) SOLN 1.25 mg  . Bevacizumab (AVASTIN) SOLN 1.25 mg       Return in about 4 weeks  (around 12/11/2020) for f/u NPDR OU, DFE, OCT.  There are no Patient Instructions on file for this visit.   Explained the diagnoses, plan, and follow up with the patient and they expressed understanding.  Patient expressed understanding of the importance of proper follow up care.   This document serves as a record of services personally performed by Gardiner Sleeper, MD, PhD. It was created on their behalf by San Jetty. Owens Shark, OA an ophthalmic technician. The creation of this record is the provider's dictation and/or activities during the visit.    Electronically signed by: San Jetty. Owens Shark, [place] 03.22.2022 11:37 AM  Gardiner Sleeper, M.D., Ph.D. Diseases & Surgery of the Retina and Vitreous [facility]  I have reviewed the above documentation for accuracy and completeness, and I agree with the above. Gardiner Sleeper, M.D., Ph.D. 11/13/20 11:37 AM  Abbreviations: M myopia (nearsighted); A astigmatism; H hyperopia (farsighted); P presbyopia; Mrx spectacle prescription;  CTL contact lenses; OD right eye; OS left eye; OU both eyes  XT exotropia; ET esotropia; PEK punctate epithelial keratitis; PEE punctate epithelial erosions; DES dry eye syndrome; MGD meibomian gland dysfunction; ATs artificial tears; PFAT's preservative free artificial tears; [place] nuclear sclerotic cataract; PSC posterior subcapsular cataract; ERM epi-retinal membrane; PVD posterior vitreous detachment; RD retinal detachment; DM diabetes mellitus; DR diabetic retinopathy; NPDR non-proliferative diabetic retinopathy; PDR proliferative diabetic retinopathy; CSME clinically significant macular edema; DME diabetic macular edema; dbh dot blot hemorrhages; CWS cotton wool spot; POAG primary open angle glaucoma; C/D cup-to-disc ratio; HVF humphrey visual field; GVF goldmann visual field; OCT optical coherence tomography; IOP intraocular pressure; BRVO Branch retinal vein occlusion; CRVO central retinal vein occlusion; CRAO  central retinal artery occlusion; BRAO branch retinal artery occlusion; RT retinal tear; SB scleral buckle; PPV pars plana vitrectomy; VH Vitreous hemorrhage; PRP panretinal laser photocoagulation; IVK intravitreal kenalog; VMT vitreomacular traction; MH Macular hole;  NVD neovascularization of the disc; NVE neovascularization elsewhere; AREDS age related eye disease study; ARMD age related macular degeneration; POAG primary open angle glaucoma; EBMD epithelial/anterior basement membrane dystrophy; ACIOL anterior chamber intraocular lens; IOL intraocular lens; PCIOL posterior chamber intraocular lens; Phaco/IOL phacoemulsification with intraocular lens placement; [place] photorefractive keratectomy; LASIK laser assisted in situ keratomileusis; HTN hypertension; DM diabetes mellitus; COPD chronic obstructive pulmonary disease

## 2020-11-13 ENCOUNTER — Ambulatory Visit (INDEPENDENT_AMBULATORY_CARE_PROVIDER_SITE_OTHER): Payer: Medicare Other | Admitting: Ophthalmology

## 2020-11-13 ENCOUNTER — Encounter (HOSPITAL_COMMUNITY): Payer: Self-pay

## 2020-11-13 ENCOUNTER — Encounter (INDEPENDENT_AMBULATORY_CARE_PROVIDER_SITE_OTHER): Payer: Self-pay | Admitting: Ophthalmology

## 2020-11-13 ENCOUNTER — Other Ambulatory Visit: Payer: Self-pay

## 2020-11-13 ENCOUNTER — Encounter (HOSPITAL_COMMUNITY)
Admission: RE | Admit: 2020-11-13 | Discharge: 2020-11-13 | Disposition: A | Discharge status: Home / Self Care | Payer: Medicare Other | Source: Ambulatory Visit | Attending: Nephrology | Admitting: Nephrology

## 2020-11-13 DIAGNOSIS — H33323 Round hole, bilateral: Secondary | ICD-10-CM

## 2020-11-13 DIAGNOSIS — E113313 Type 2 diabetes mellitus with moderate nonproliferative diabetic retinopathy with macular edema, bilateral: Secondary | ICD-10-CM

## 2020-11-13 DIAGNOSIS — H04123 Dry eye syndrome of bilateral lacrimal glands: Secondary | ICD-10-CM | POA: Diagnosis not present

## 2020-11-13 DIAGNOSIS — H35413 Lattice degeneration of retina, bilateral: Secondary | ICD-10-CM

## 2020-11-13 DIAGNOSIS — H3581 Retinal edema: Secondary | ICD-10-CM | POA: Diagnosis not present

## 2020-11-13 DIAGNOSIS — I10 Essential (primary) hypertension: Secondary | ICD-10-CM | POA: Diagnosis not present

## 2020-11-13 DIAGNOSIS — H25813 Combined forms of age-related cataract, bilateral: Secondary | ICD-10-CM | POA: Diagnosis not present

## 2020-11-13 DIAGNOSIS — N184 Chronic kidney disease, stage 4 (severe): Secondary | ICD-10-CM | POA: Diagnosis not present

## 2020-11-13 DIAGNOSIS — H35033 Hypertensive retinopathy, bilateral: Secondary | ICD-10-CM

## 2020-11-13 DIAGNOSIS — D631 Anemia in chronic kidney disease: Secondary | ICD-10-CM | POA: Diagnosis not present

## 2020-11-13 LAB — POCT HEMOGLOBIN-HEMACUE: Hemoglobin: 8.9 g/dL — ABNORMAL LOW (ref 12.0–15.0)

## 2020-11-13 MED ORDER — EPOETIN ALFA 3000 UNIT/ML IJ SOLN
3000.0000 [IU] | Freq: Once | INTRAMUSCULAR | Status: AC
  Administered 2020-11-13: 3000 [IU] via SUBCUTANEOUS

## 2020-11-13 MED ORDER — BEVACIZUMAB CHEMO INJECTION 1.25MG/0.05ML SYRINGE FOR KALEIDOSCOPE
1.2500 mg | INTRAVITREAL | Status: AC | PRN
  Administered 2020-11-13: 1.25 mg via INTRAVITREAL

## 2020-11-13 MED ORDER — EPOETIN ALFA 3000 UNIT/ML IJ SOLN
INTRAMUSCULAR | Status: AC
  Filled 2020-11-13: qty 1

## 2020-11-19 DIAGNOSIS — I10 Essential (primary) hypertension: Secondary | ICD-10-CM | POA: Diagnosis not present

## 2020-11-20 DIAGNOSIS — I129 Hypertensive chronic kidney disease with stage 1 through stage 4 chronic kidney disease, or unspecified chronic kidney disease: Secondary | ICD-10-CM | POA: Diagnosis not present

## 2020-11-20 DIAGNOSIS — I10 Essential (primary) hypertension: Secondary | ICD-10-CM | POA: Diagnosis not present

## 2020-11-20 DIAGNOSIS — N185 Chronic kidney disease, stage 5: Secondary | ICD-10-CM | POA: Diagnosis not present

## 2020-11-20 DIAGNOSIS — N189 Chronic kidney disease, unspecified: Secondary | ICD-10-CM | POA: Diagnosis not present

## 2020-11-20 DIAGNOSIS — E1122 Type 2 diabetes mellitus with diabetic chronic kidney disease: Secondary | ICD-10-CM | POA: Diagnosis not present

## 2020-11-20 DIAGNOSIS — E211 Secondary hyperparathyroidism, not elsewhere classified: Secondary | ICD-10-CM | POA: Diagnosis not present

## 2020-11-20 DIAGNOSIS — D631 Anemia in chronic kidney disease: Secondary | ICD-10-CM | POA: Diagnosis not present

## 2020-11-24 DIAGNOSIS — I10 Essential (primary) hypertension: Secondary | ICD-10-CM | POA: Diagnosis not present

## 2020-11-24 DIAGNOSIS — I5032 Chronic diastolic (congestive) heart failure: Secondary | ICD-10-CM | POA: Diagnosis not present

## 2020-11-24 DIAGNOSIS — N185 Chronic kidney disease, stage 5: Secondary | ICD-10-CM | POA: Diagnosis not present

## 2020-11-25 ENCOUNTER — Encounter (HOSPITAL_COMMUNITY): Payer: Self-pay | Admitting: Internal Medicine

## 2020-11-27 ENCOUNTER — Other Ambulatory Visit: Payer: Self-pay

## 2020-11-27 ENCOUNTER — Encounter (HOSPITAL_COMMUNITY)
Admission: RE | Admit: 2020-11-27 | Discharge: 2020-11-27 | Disposition: A | Discharge status: Home / Self Care | Payer: Medicare Other | Source: Ambulatory Visit | Attending: Nephrology | Admitting: Nephrology

## 2020-11-27 ENCOUNTER — Encounter (HOSPITAL_COMMUNITY): Payer: Self-pay

## 2020-11-27 DIAGNOSIS — D631 Anemia in chronic kidney disease: Secondary | ICD-10-CM | POA: Insufficient documentation

## 2020-11-27 DIAGNOSIS — N185 Chronic kidney disease, stage 5: Secondary | ICD-10-CM | POA: Insufficient documentation

## 2020-11-27 MED ORDER — EPOETIN ALFA-EPBX 10000 UNIT/ML IJ SOLN
6000.0000 [IU] | Freq: Once | INTRAMUSCULAR | Status: AC
  Administered 2020-11-27: 6000 [IU] via SUBCUTANEOUS
  Filled 2020-11-27: qty 1

## 2020-11-30 ENCOUNTER — Other Ambulatory Visit (HOSPITAL_COMMUNITY)
Admission: RE | Admit: 2020-11-30 | Discharge: 2020-11-30 | Disposition: A | Discharge status: Home / Self Care | Payer: Medicare Other | Source: Ambulatory Visit | Attending: Internal Medicine | Admitting: Internal Medicine

## 2020-11-30 ENCOUNTER — Other Ambulatory Visit: Payer: Self-pay

## 2020-11-30 DIAGNOSIS — Z01812 Encounter for preprocedural laboratory examination: Secondary | ICD-10-CM | POA: Insufficient documentation

## 2020-11-30 DIAGNOSIS — Z20822 Contact with and (suspected) exposure to covid-19: Secondary | ICD-10-CM | POA: Insufficient documentation

## 2020-11-30 LAB — SARS CORONAVIRUS 2 (TAT 6-24 HRS): SARS Coronavirus 2: NEGATIVE

## 2020-12-02 ENCOUNTER — Ambulatory Visit (HOSPITAL_COMMUNITY)
Admission: RE | Admit: 2020-12-02 | Discharge: 2020-12-02 | Disposition: A | Discharge status: Home / Self Care | Payer: Medicare Other | Attending: Internal Medicine | Admitting: Internal Medicine

## 2020-12-02 ENCOUNTER — Other Ambulatory Visit: Payer: Self-pay

## 2020-12-02 ENCOUNTER — Encounter (HOSPITAL_COMMUNITY)
Admission: RE | Disposition: A | Discharge status: Home / Self Care | Payer: Self-pay | Source: Home / Self Care | Attending: Internal Medicine

## 2020-12-02 DIAGNOSIS — Z91040 Latex allergy status: Secondary | ICD-10-CM | POA: Diagnosis not present

## 2020-12-02 DIAGNOSIS — Z79899 Other long term (current) drug therapy: Secondary | ICD-10-CM | POA: Insufficient documentation

## 2020-12-02 DIAGNOSIS — Z7982 Long term (current) use of aspirin: Secondary | ICD-10-CM | POA: Diagnosis not present

## 2020-12-02 DIAGNOSIS — Z794 Long term (current) use of insulin: Secondary | ICD-10-CM | POA: Insufficient documentation

## 2020-12-02 DIAGNOSIS — K317 Polyp of stomach and duodenum: Secondary | ICD-10-CM

## 2020-12-02 DIAGNOSIS — K259 Gastric ulcer, unspecified as acute or chronic, without hemorrhage or perforation: Secondary | ICD-10-CM | POA: Diagnosis not present

## 2020-12-02 DIAGNOSIS — Z79890 Hormone replacement therapy (postmenopausal): Secondary | ICD-10-CM | POA: Insufficient documentation

## 2020-12-02 DIAGNOSIS — D509 Iron deficiency anemia, unspecified: Secondary | ICD-10-CM | POA: Diagnosis not present

## 2020-12-02 DIAGNOSIS — K219 Gastro-esophageal reflux disease without esophagitis: Secondary | ICD-10-CM | POA: Diagnosis not present

## 2020-12-02 DIAGNOSIS — K2950 Unspecified chronic gastritis without bleeding: Secondary | ICD-10-CM | POA: Diagnosis not present

## 2020-12-02 DIAGNOSIS — K3189 Other diseases of stomach and duodenum: Secondary | ICD-10-CM | POA: Diagnosis not present

## 2020-12-02 HISTORY — PX: ESOPHAGOGASTRODUODENOSCOPY: SHX5428

## 2020-12-02 HISTORY — PX: POLYPECTOMY: SHX5525

## 2020-12-02 HISTORY — PX: BIOPSY: SHX5522

## 2020-12-02 LAB — GLUCOSE, CAPILLARY: Glucose-Capillary: 83 mg/dL (ref 70–99)

## 2020-12-02 SURGERY — EGD (ESOPHAGOGASTRODUODENOSCOPY)
Anesthesia: Moderate Sedation

## 2020-12-02 MED ORDER — MEPERIDINE HCL 100 MG/ML IJ SOLN
INTRAMUSCULAR | Status: DC | PRN
  Administered 2020-12-02: 25 mg via INTRAVENOUS
  Administered 2020-12-02: 15 mg via INTRAVENOUS

## 2020-12-02 MED ORDER — SODIUM CHLORIDE 0.9 % IV SOLN
INTRAVENOUS | Status: DC
  Administered 2020-12-02: 1000 mL via INTRAVENOUS

## 2020-12-02 MED ORDER — ONDANSETRON HCL 4 MG/2ML IJ SOLN
INTRAMUSCULAR | Status: AC
  Filled 2020-12-02: qty 2

## 2020-12-02 MED ORDER — ONDANSETRON HCL 4 MG/2ML IJ SOLN
INTRAMUSCULAR | Status: DC | PRN
  Administered 2020-12-02: 4 mg via INTRAVENOUS

## 2020-12-02 MED ORDER — LIDOCAINE VISCOUS HCL 2 % MT SOLN
OROMUCOSAL | Status: AC
  Filled 2020-12-02: qty 15

## 2020-12-02 MED ORDER — MIDAZOLAM HCL 5 MG/5ML IJ SOLN
INTRAMUSCULAR | Status: AC
  Filled 2020-12-02: qty 10

## 2020-12-02 MED ORDER — MIDAZOLAM HCL 5 MG/5ML IJ SOLN
INTRAMUSCULAR | Status: DC | PRN
  Administered 2020-12-02 (×4): 2 mg via INTRAVENOUS

## 2020-12-02 MED ORDER — STERILE WATER FOR IRRIGATION IR SOLN
Status: DC | PRN
  Administered 2020-12-02: 200 mL

## 2020-12-02 MED ORDER — MEPERIDINE HCL 50 MG/ML IJ SOLN
INTRAMUSCULAR | Status: AC
  Filled 2020-12-02: qty 1

## 2020-12-02 NOTE — Op Note (Signed)
[facility] Patient Name: Cindy Park Procedure Date: 12/02/2020 9:04 AM MRN: [id number] Date of Birth: July 15, 1956 Attending MD: Norvel Richards , MD CSN: [id number] Age: 65 Admit Type: Outpatient Procedure:                Upper GI endoscopy Indications:              Suspected gastro-esophageal reflux disease Providers:                Norvel Richards, MD, Lurline Del, RN, Wynonia Musty Tech, Technician Referring MD:              Medicines:                Midazolam 8 mg IV, Meperidine 40 mg IV Complications:            No immediate complications. Estimated Blood Loss:     Estimated blood loss was minimal. Procedure:                Pre-Anesthesia Assessment:                           - Prior to the procedure, a History and Physical                            was performed, and patient medications and                            allergies were reviewed. The patient's tolerance of                            previous anesthesia was also reviewed. The risks                            and benefits of the procedure and the sedation                            options and risks were discussed with the patient.                            All questions were answered, and informed consent                            was obtained. Prior Anticoagulants: The patient has                            taken no previous anticoagulant or antiplatelet                            agents. ASA Grade Assessment: III - A patient with                            severe systemic disease. After reviewing the risks  and benefits, the patient was deemed in                            satisfactory condition to undergo the procedure.                           After obtaining informed consent, the endoscope was                            passed under direct vision. Throughout the                            procedure, the patient's blood pressure, pulse, and                             oxygen saturations were monitored continuously. The                            [id number]) was introduced through the mouth,                            and advanced to the second part of duodenum. The                            upper GI endoscopy was accomplished without                            difficulty. The patient tolerated the procedure                            well. Scope In: 9:37:45 AM Scope Out: 9:43:01 AM Total Procedure Duration: 0 hours 5 minutes 16 seconds  Findings:      The examined esophagus was normal.      One 4 mm sessile polyp with no stigmata of recent bleeding was found on       the greater curvature of the stomach. Patchy erythema of the antrum       noted. No ulcer or infiltrating process observed. Patent pylorus. The       polyp was removed with a cold biopsy forceps. Resection and retrieval       were complete. Estimated blood loss was minimal.      The duodenal bulb and second portion of the duodenum were normal. Impression:               - Normal esophagus.                           - One gastric polyp. Resected and retrieved. Patchy                            gastric erythema?"status post gastric biopsy                           - Normal duodenal bulb and second portion of the  duodenum. Moderate Sedation:      Moderate (conscious) sedation was administered by the endoscopy nurse       and supervised by the endoscopist. The following parameters were       monitored: oxygen saturation, heart rate, blood pressure, respiratory       rate, EKG, adequacy of pulmonary ventilation, and response to care. Recommendation:           - Patient has a contact number available for                            emergencies. The signs and symptoms of potential                            delayed complications were discussed with the                            patient. Return to normal activities tomorrow.                             Written discharge instructions were provided to the                            patient.                           - Advance diet as tolerated. Follow-up on                            pathology. Office visit with [place] in 3 months Procedure Code(s):        --- Professional ---                           [phone], Esophagogastroduodenoscopy, flexible,                            transoral; with biopsy, single or multiple Diagnosis Code(s):        --- Professional ---                           K31.7, Polyp of stomach and duodenum CPT copyright 2019 American Medical Association. All rights reserved. The codes documented in this report are preliminary and upon coder review may  be revised to meet current compliance requirements. Cristopher Estimable. Rashawna Scoles, MD Norvel Richards, MD 12/02/2020 9:58:57 AM This report has been signed electronically. Number of Addenda: 0

## 2020-12-02 NOTE — Discharge Instructions (Signed)
EGD Discharge instructions Please read the instructions outlined below and refer to this sheet in the next few weeks. These discharge instructions provide you with general information on caring for yourself after you leave the hospital. Your doctor may also give you specific instructions. While your treatment has been planned according to the most current medical practices available, unavoidable complications occasionally occur. If you have any problems or questions after discharge, please call your doctor. ACTIVITY  You may resume your regular activity but move at a slower pace for the next 24 hours.   Take frequent rest periods for the next 24 hours.   Walking will help expel (get rid of) the air and reduce the bloated feeling in your abdomen.   No driving for 24 hours (because of the anesthesia (medicine) used during the test).   You may shower.   Do not sign any important legal documents or operate any machinery for 24 hours (because of the anesthesia used during the test).  NUTRITION  Drink plenty of fluids.   You may resume your normal diet.   Begin with a light meal and progress to your normal diet.   Avoid alcoholic beverages for 24 hours or as instructed by your caregiver.  MEDICATIONS  You may resume your normal medications unless your caregiver tells you otherwise.  WHAT YOU CAN EXPECT TODAY  You may experience abdominal discomfort such as a feeling of fullness or "gas" pains.  FOLLOW-UP  Your doctor will discuss the results of your test with you.  SEEK IMMEDIATE MEDICAL ATTENTION IF ANY OF THE FOLLOWING OCCUR:  Excessive nausea (feeling sick to your stomach) and/or vomiting.   Severe abdominal pain and distention (swelling).   Trouble swallowing.   Temperature over 101 F (37.8 C).   Rectal bleeding or vomiting of blood.     1 polyp removed in your stomach.  Stomach was inflamed.  Biopsies taken.  Other recommendations to follow pending review of  pathology report  Office visit with [place] in 3 months  At patient request, I called Terry at [phone] and reviewed results and recommendations

## 2020-12-02 NOTE — H&P (Signed)
Patient Lines/Drains/Airways Status    Active Line/Drains/Airways    Name Placement date Placement time Site Days   Peripheral IV 12/02/20 Posterior;Right Hand 12/02/20  0909  Hand  less than 1   Fistula / Graft Left Upper arm Arteriovenous fistula 06/11/20  0817  Upper arm  174   Incision (Closed) 06/11/20 Arm Left 06/11/20  0807  -- 174   Incision (Closed) 08/06/20 Arm Left 08/06/20  0819  -- 118         @LOGO @   Primary Care Physician:  Monico Blitz, MD Primary Gastroenterologist:  Dr. Gala Romney  Pre-Procedure History & Physical: HPI:  Cindy Park is a 65 y.o. female here for longstanding GERD/anemia.  No dysphagia.  Here for diagnostic EGD per plan.  Past Medical History:  Diagnosis Date  . Anemia   . Arthritis   . Asthma   . Cataract    Combined form OU  . COPD (chronic obstructive pulmonary disease) ([place])   . Diabetes mellitus without complication ([place])   . Diabetic retinopathy ([place])    BDR OU  . Family history of adverse reaction to anesthesia    sister- difficult to wake up from anesthesia  . Fluid excess   . GERD (gastroesophageal reflux disease)   . Goiter   . Hypertension   . Hypertensive retinopathy    OU  . Renal failure     Past Surgical History:  Procedure Laterality Date  . ABDOMINAL HYSTERECTOMY    . AV FISTULA PLACEMENT Left 06/11/2020   Procedure: LEFT ARM ARTERIOVENOUS (AV) FISTULA CREATION;  Surgeon: [name], MD;  Location: [facility];  Service: Vascular;  Laterality: Left;  . BASCILIC VEIN TRANSPOSITION Left 08/06/2020   Procedure: LEFT ARM SECOND STAGE BASCILIC VEIN TRANSPOSITION;  Surgeon: [name], MD;  Location: [facility];  Service: Vascular;  Laterality: Left;  . CHOLECYSTECTOMY    . COLONOSCOPY N/A 03/25/2020   Trayven Lumadue: two tubular adenomas removed. next colonoscopy five years  . POLYPECTOMY  03/25/2020   Procedure: POLYPECTOMY;  Surgeon: Daneil Dolin, MD;  Location: [facility];  Service: Endoscopy;;    Prior to Admission  medications   Medication Sig Start Date End Date Taking? Authorizing Provider  acetaminophen (TYLENOL) 500 MG tablet Take 1,000 mg by mouth every 6 (six) hours as needed for moderate pain.    Yes [provider]  albuterol (PROVENTIL) (2.5 MG/3ML) 0.083% nebulizer solution Take 2.5 mg by nebulization 4 (four) times daily.    Yes [provider]  albuterol (VENTOLIN HFA) 108 (90 Base) MCG/ACT inhaler Inhale 2 puffs into the lungs every 6 (six) hours as needed for wheezing or shortness of breath.   Yes [provider]  amLODipine (NORVASC) 5 MG tablet Take 5 mg by mouth 2 (two) times daily.   Yes [provider]  aspirin EC 81 MG tablet Take 81 mg by mouth in the morning.   Yes [provider]  atorvastatin (LIPITOR) 40 MG tablet Take 40 mg by mouth at bedtime.    Yes [provider]  calcitRIOL (ROCALTROL) 0.25 MCG capsule Take 0.25 mcg by mouth in the morning.   Yes [provider]  carvedilol (COREG) 25 MG tablet Take 25 mg by mouth 2 (two) times daily with a meal.  07/01/20  Yes [provider]  Cholecalciferol (VITAMIN D3) 50 MCG (2000 UT) TABS Take 2,000 Units by mouth every evening.   Yes [provider]  clonazePAM (KLONOPIN) 0.5 MG tablet Take  0.5 mg by mouth 2 (two) times daily as needed for anxiety.   Yes [provider]  dicyclomine (BENTYL) 10 MG capsule Take 10 mg by mouth 2 (two) times daily.    Yes [provider]  docusate sodium (COLACE) 100 MG capsule Take 200 mg by mouth daily.   Yes [provider]  epoetin alfa (EPOGEN) 3000 UNIT/ML injection Inject 3,000 Units into the skin every 14 (fourteen) days.    Yes Bhutani, Manpreet S, MD  ferrous sulfate 325 (65 FE) MG tablet Take 325 mg by mouth 2 (two) times daily with a meal.   Yes [provider]  Fluticasone-Umeclidin-Vilant (TRELEGY ELLIPTA) 100-62.5-25 MCG/INH AEPB Inhale 1 puff into the lungs daily.   Yes [provider]  furosemide (LASIX) 80 MG tablet Take 80 mg by mouth 2 (two) times daily.   Yes [provider]  gabapentin (NEURONTIN) 300 MG capsule Take 300 mg by mouth 2 (two) times daily.   Yes [provider]  gemfibrozil (LOPID) 600 MG tablet Take 600 mg by mouth in the morning and at bedtime.   Yes [provider]  Insulin Lispro Prot & Lispro (HUMALOG MIX 75/25 [place]) Inject 15-60 Units into the skin See admin instructions. Inject 60 units into the skin in the morning and 15 units at lunch and 60 units at supper   Yes [provider]  levothyroxine (SYNTHROID) 50 MCG tablet Take 50 mcg by mouth daily before breakfast.  03/05/20  Yes [provider]  montelukast (SINGULAIR) 10 MG tablet Take 10 mg by mouth at bedtime.   Yes [provider]  omeprazole (PRILOSEC) 20 MG capsule Take 20 mg by mouth every evening.   Yes [provider]  ondansetron (ZOFRAN) 4 MG tablet Take 4 mg by mouth every 8 (eight) hours as needed for nausea or vomiting. 09/28/20  Yes [provider]  sitaGLIPtin (JANUVIA) 25 MG tablet Take 25 mg by mouth in the morning.   Yes [provider]  sodium bicarbonate 650 MG tablet Take 650 mg by mouth 2 (two) times daily.   Yes [provider]  triamcinolone (KENALOG) 0.1 % Apply 1 application topically daily as needed (skin irritation). To feet 07/07/20  Yes [provider]  Vitamin A 2400 MCG (8000 UT) TABS Take 2,400 mcg by mouth every evening.   Yes [provider]  vitamin B-12 (CYANOCOBALAMIN) 500 MCG tablet Take 500 mcg by mouth in the morning.   Yes [provider]  epoetin alfa-epbx (RETACRIT) 79024 UNIT/ML injection 6,000 Units every 14 (fourteen) days. 11/27/20   Bhutani, Lavina Hamman, MD  oxyCODONE-acetaminophen (PERCOCET) 5-325 MG tablet Take 1 tablet by mouth every 6 (six) hours as needed for severe pain. Patient not taking: No sig reported 08/26/20 08/26/21   Dagoberto Ligas, PA-C  vitamin E 180 MG (400 UNITS) capsule Take 400 Units by mouth daily.    [provider]    Allergies as of 10/16/2020 - Review Complete 10/16/2020  Allergen Reaction Noted  . Latex Shortness Of Breath and Rash 06/11/2020  . Niacin Other (See Comments)   . Other Dermatitis 03/13/2020  . Tomato Other (See Comments) 03/13/2020  . Bactrim [sulfamethoxazole-trimethoprim] Swelling and Rash 01/24/2020    Family History  Problem Relation Age of Onset  . Kidney disease Mother        ESRD  . Kidney disease Sister        ESRD  . Colon polyps Brother 26  .  Kidney disease Maternal Grandmother        ESRD  . Colon cancer Neg Hx     Social History   Socioeconomic History  . Marital status: Married    Spouse name: Not on file  . Number of children: Not on file  . Years of education: Not on file  . Highest education level: Not on file  Occupational History  . Not on file  Tobacco Use  . Smoking status: Never Smoker  . Smokeless tobacco: Never Used  Vaping Use  . Vaping Use: Never used  Substance and Sexual Activity  . Alcohol use: No  . Drug use: No  . Sexual activity: Yes  Other Topics Concern  . Not on file  Social History Narrative  . Not on file   Social Determinants of Health   Financial Resource Strain: Not on file  Food Insecurity: Not on file  Transportation Needs: Not on file  Physical Activity: Not on file  Stress: Not on file  Social Connections: Not on file  Intimate Partner Violence: Not on file    Review of Systems: See HPI, otherwise negative ROS  Physical Exam: BP (!) 155/77   Pulse 72   Temp 97.9 F (36.6 C) (Oral)   Resp 18   Ht 5\' 7"  (1.702 m)   Wt 97.5 kg   SpO2 100%   BMI 33.67 kg/m  General:   Alert,  Well-developed, well-nourished, pleasant and cooperative in NAD Mouth:  No deformity or lesions. Neck:  Supple; no masses or thyromegaly. No significant cervical adenopathy. Lungs:  Clear throughout to  auscultation.   No wheezes, crackles, or rhonchi. No acute distress. Heart:  Regular rate and rhythm; no murmurs, clicks, rubs,  or gallops. Abdomen: Non-distended, normal bowel sounds.  Soft and nontender without appreciable mass or hepatosplenomegaly.  Pulses:  Normal pulses noted. Extremities:  Without clubbing or edema.  Impression/Plan: 65 year old lady here for EGD to further evaluate longstanding GERD and anemia.  No dysphagia. The risks, benefits, limitations, alternatives and imponderables have been reviewed with the patient. Potential for esophageal dilation, biopsy, etc. have also been reviewed.  Questions have been answered. All parties agreeable.     Notice: This dictation was prepared with Dragon dictation along with smaller phrase technology. Any transcriptional errors that result from this process are unintentional and may not be corrected upon review.

## 2020-12-03 LAB — SURGICAL PATHOLOGY

## 2020-12-03 LAB — POCT HEMOGLOBIN-HEMACUE: Hemoglobin: 8.9 g/dL — ABNORMAL LOW (ref 12.0–15.0)

## 2020-12-07 ENCOUNTER — Encounter (HOSPITAL_COMMUNITY): Payer: Self-pay | Admitting: Internal Medicine

## 2020-12-09 ENCOUNTER — Other Ambulatory Visit (HOSPITAL_COMMUNITY): Payer: Self-pay | Admitting: *Deleted

## 2020-12-09 ENCOUNTER — Encounter: Payer: Self-pay | Admitting: Internal Medicine

## 2020-12-09 NOTE — Progress Notes (Signed)
[facility] Note  12/14/2020     CHIEF COMPLAINT Patient presents for Retina Follow Up   HISTORY OF PRESENT ILLNESS: Cindy Park is a 65 y.o. female who presents to the clinic today for:   HPI    Retina Follow Up    Patient presents with  Diabetic Retinopathy.  In both eyes.  This started months ago.  Severity is moderate.  Duration of 4 weeks.  Since onset it is stable.  I, the attending physician,  performed the HPI with the patient and updated documentation appropriately.          Comments    65 y/o female pt here for 4 wk f/u for mod NPDR OU w/DME OU.  No change in [place] OU.  Denies pain, FOL, floaters.  No gtts.  BS 97 this a.m.  A1C 6.?       Last edited by Bernarda Caffey, MD on 12/14/2020 12:25 PM. (History)    Pt has not noticed a difference in [place].  Referring physician: Leticia Clas OD [address], [place] [address]  HISTORICAL INFORMATION:   Selected notes from the MEDICAL RECORD NUMBER Referred by Dr. Leticia Clas for DM eval LEE: 08.19.2021 BCVA OD: 20/20 OS: 20/20 Ocular Hx- cataract, CWS, macular edema PMH- DM, HTN, stage 3 kidney failure, CHF   CURRENT MEDICATIONS: No current outpatient medications on file. (Ophthalmic Drugs)   No current facility-administered medications for this visit. (Ophthalmic Drugs)   Current Outpatient Medications (Other)  Medication Sig  . acetaminophen (TYLENOL) 500 MG tablet Take 1,000 mg by mouth every 6 (six) hours as needed for moderate pain.   Marland Kitchen albuterol (PROVENTIL) (2.5 MG/3ML) 0.083% nebulizer solution Take 2.5 mg by nebulization 4 (four) times daily.   Marland Kitchen albuterol (VENTOLIN HFA) 108 (90 Base) MCG/ACT inhaler Inhale 2 puffs into the lungs every 6 (six) hours as needed for wheezing or shortness of breath.  Marland Kitchen amLODipine (NORVASC) 5 MG tablet Take 5 mg by mouth 2 (two) times daily.  Marland Kitchen aspirin EC 81 MG tablet Take 81 mg by mouth in the morning.  Marland Kitchen atorvastatin (LIPITOR) 40 MG  tablet Take 40 mg by mouth at bedtime.   . calcitRIOL (ROCALTROL) 0.25 MCG capsule Take 0.25 mcg by mouth in the morning.  . carvedilol (COREG) 25 MG tablet Take 25 mg by mouth 2 (two) times daily with a meal.   . Cholecalciferol (VITAMIN D3) 50 MCG (2000 UT) TABS Take 2,000 Units by mouth every evening.  . clonazePAM (KLONOPIN) 0.5 MG tablet Take 0.5 mg by mouth 2 (two) times daily as needed for anxiety.  . dicyclomine (BENTYL) 10 MG capsule Take 10 mg by mouth 2 (two) times daily.   Marland Kitchen docusate sodium (COLACE) 100 MG capsule Take 200 mg by mouth daily.  Marland Kitchen epoetin alfa (EPOGEN) 3000 UNIT/ML injection Inject 3,000 Units into the skin every 14 (fourteen) days.   Marland Kitchen epoetin alfa-epbx (RETACRIT) 78469 UNIT/ML injection 6,000 Units every 14 (fourteen) days.  . ferrous sulfate 325 (65 FE) MG tablet Take 325 mg by mouth 2 (two) times daily with a meal.  . Fluticasone-Umeclidin-Vilant (TRELEGY ELLIPTA) 100-62.5-25 MCG/INH AEPB Inhale 1 puff into the lungs daily.  . furosemide (LASIX) 80 MG tablet Take 80 mg by mouth 2 (two) times daily.  Marland Kitchen gabapentin (NEURONTIN) 300 MG capsule Take 300 mg by mouth 2 (two) times daily.  Marland Kitchen gemfibrozil (LOPID) 600 MG tablet Take 600 mg  by mouth in the morning and at bedtime.  . Insulin Lispro Prot & Lispro (HUMALOG MIX 75/25 Katherine) Inject 15-60 Units into the skin See admin instructions. Inject 60 units into the skin in the morning and 15 units at lunch and 60 units at supper  . levothyroxine (SYNTHROID) 50 MCG tablet Take 50 mcg by mouth daily before breakfast.   . montelukast (SINGULAIR) 10 MG tablet Take 10 mg by mouth at bedtime.  Marland Kitchen omeprazole (PRILOSEC) 20 MG capsule Take 20 mg by mouth every evening.  . ondansetron (ZOFRAN) 4 MG tablet Take 4 mg by mouth every 8 (eight) hours as needed for nausea or vomiting.  Marland Kitchen oxyCODONE-acetaminophen (PERCOCET) 5-325 MG tablet Take 1 tablet by mouth every 6 (six) hours as needed for severe pain.  . sevelamer carbonate (RENVELA) 800 MG  tablet Take by mouth.  . sitaGLIPtin (JANUVIA) 25 MG tablet Take 25 mg by mouth in the morning.  . sodium bicarbonate 650 MG tablet Take 650 mg by mouth 2 (two) times daily.  Marland Kitchen triamcinolone (KENALOG) 0.1 % Apply 1 application topically daily as needed (skin irritation). To feet  . Vitamin A 2400 MCG (8000 UT) TABS Take 2,400 mcg by mouth every evening.  . vitamin B-12 (CYANOCOBALAMIN) 500 MCG tablet Take 500 mcg by mouth in the morning.  . vitamin E 180 MG (400 UNITS) capsule Take 400 Units by mouth daily.   No current facility-administered medications for this visit. (Other)      REVIEW OF SYSTEMS: ROS    Positive for: Gastrointestinal, Genitourinary, Endocrine, Eyes, Respiratory   Negative for: Constitutional, Neurological, Skin, Musculoskeletal, HENT, Cardiovascular, Psychiatric, Allergic/Imm, Heme/Lymph   Last edited by Matthew Folks, COA on 12/14/2020  9:45 AM. (History)       ALLERGIES Allergies  Allergen Reactions  . Latex Shortness Of Breath and Rash    With powder   . Niacin Other (See Comments)    Burning  . Other Dermatitis    Powder in the gloves   . Tomato Other (See Comments)    Breaks pt out   . Bactrim [Sulfamethoxazole-Trimethoprim] Swelling and Rash    PAST MEDICAL HISTORY Past Medical History:  Diagnosis Date  . Anemia   . Arthritis   . Asthma   . Cataract    Combined form OU  . COPD (chronic obstructive pulmonary disease) ([place])   . Diabetes mellitus without complication ([place])   . Diabetic retinopathy ([place])    BDR OU  . Family history of adverse reaction to anesthesia    sister- difficult to wake up from anesthesia  . Fluid excess   . GERD (gastroesophageal reflux disease)   . Goiter   . Hypertension   . Hypertensive retinopathy    OU  . Renal failure    Past Surgical History:  Procedure Laterality Date  . ABDOMINAL HYSTERECTOMY    . AV FISTULA PLACEMENT Left 06/11/2020   Procedure: LEFT ARM ARTERIOVENOUS (AV) FISTULA CREATION;   Surgeon: [name], MD;  Location: [facility];  Service: Vascular;  Laterality: Left;  . BASCILIC VEIN TRANSPOSITION Left 08/06/2020   Procedure: LEFT ARM SECOND STAGE BASCILIC VEIN TRANSPOSITION;  Surgeon: [name], MD;  Location: [facility];  Service: Vascular;  Laterality: Left;  . BIOPSY  12/02/2020   Procedure: BIOPSY;  Surgeon: Daneil Dolin, MD;  Location: [facility];  Service: Endoscopy;;  gastric  . CHOLECYSTECTOMY    . COLONOSCOPY N/A 03/25/2020   [place]k: two tubular adenomas removed.  next colonoscopy five years  . ESOPHAGOGASTRODUODENOSCOPY N/A 12/02/2020   Procedure: ESOPHAGOGASTRODUODENOSCOPY (EGD);  Surgeon: Daneil Dolin, MD;  Location: [facility];  Service: Endoscopy;  Laterality: N/A;  am appt  . POLYPECTOMY  03/25/2020   Procedure: POLYPECTOMY;  Surgeon: Daneil Dolin, MD;  Location: [facility];  Service: Endoscopy;;  . POLYPECTOMY  12/02/2020   Procedure: POLYPECTOMY;  Surgeon: Daneil Dolin, MD;  Location: [facility];  Service: Endoscopy;;  gastric    FAMILY HISTORY Family History  Problem Relation Age of Onset  . Kidney disease Mother        ESRD  . Kidney disease Sister        ESRD  . Colon polyps Brother 48  . Kidney disease Maternal Grandmother        ESRD  . Colon cancer Neg Hx     SOCIAL HISTORY Social History   Tobacco Use  . Smoking status: Never Smoker  . Smokeless tobacco: Never Used  Vaping Use  . Vaping Use: Never used  Substance Use Topics  . Alcohol use: No  . Drug use: No         OPHTHALMIC EXAM:  Base Eye Exam    Visual Acuity (Snellen - Linear)      Right Left   Dist cc 20/25 -2 20/30 -2   Dist ph cc 20/20 -2 20/25   Correction: Glasses       Tonometry (Tonopen, 9:47 AM)      Right Left   Pressure 15 14       Pupils      Dark Light Shape React APD   Right 3 2 Round Brisk None   Left 3 2 Round Brisk None       Visual Fields (Counting fingers)      Left Right    Full Full       Extraocular Movement       Right Left    Full, Ortho Full, Ortho       Neuro/Psych    Oriented x3: Yes   Mood/Affect: Normal       Dilation    Both eyes: 1.0% Mydriacyl, 2.5% Phenylephrine @ 9:47 AM        Slit Lamp and Fundus Exam    Slit Lamp Exam      Right Left   Lids/Lashes Dermato, mild MGD Dermarto, mild MGD   Conjunctiva/Sclera Nasal pinguecula, mild melanosis, inferior Conjunctivochalasis Nasal pinguecula, mild melanosis, inferior Conjunctivochalasis   Cornea 1+PEE, trace arcus trace PEE, trace arcus   Anterior Chamber Deep and quiet Deep and quiet   Iris Round and dilated Round and dilated   Lens 2+ NS, 2-3+ cortical 2-3+ NS, 2-3+ cortical   Vitreous Synerisis Synerisis       Fundus Exam      Right Left   Disc Pink, sharp, Compact, focal PPP Pink, sharp, compact   C/D Ratio 0.1 0.2   Macula Blunted foveal reflex, mild rpe mottling, scattered cystic changes - persistent centrally, scattered MA / IRH Good foveal reflex, mild rpe mottling, scattered cystic changes - persistent, scattered MA--improved, focal CWS SN mac just inside ST arcades--improved   Vessels attenuated, mild tortuousity attenuated, mild tortuousity, mild Copper wiring   Periphery Attached; punctate CWS nasal to disc, Focal pigmented lattice with atrophic hole at 12 and in IT quad, greatest at 0730 -- good laser surrounding all patches of lattice, minimal MA, No new RT/RD/lattice Attached, Focal pigmented lattice from 0530-0700 --  good laser surrounding, minimal MA, No new RT/RD/lattice          IMAGING AND PROCEDURES  Imaging and Procedures for 12/14/2020  OCT, Retina - OU - Both Eyes       Right Eye Quality was good. Central Foveal Thickness: 324. Progression has been stable. Findings include abnormal foveal contour, intraretinal fluid, no SRF (Persistent IRF).   Left Eye Quality was good. Central Foveal Thickness: 263. Progression has improved. Findings include normal foveal contour, intraretinal fluid, no SRF,  vitreomacular adhesion  (Persistent IRF superior macula--slightly improved).   Notes *Images captured and stored on drive  Diagnosis / Impression:  Mod NPDR w/ DME OU OD: Persistent IRF OS: Persistent IRF superior macula--slightly improved  Clinical management:  See below  Abbreviations: NFP - Normal foveal profile. CME - cystoid macular edema. PED - pigment epithelial detachment. IRF - intraretinal fluid. SRF - subretinal fluid. EZ - ellipsoid zone. ERM - epiretinal membrane. ORA - outer retinal atrophy. ORT - outer retinal tubulation. SRHM - subretinal hyper-reflective material. IRHM - intraretinal hyper-reflective material        Intravitreal Injection, Pharmacologic Agent - OD - Right Eye       Time Out 12/14/2020. 10:44 AM. Confirmed correct patient, procedure, site, and patient consented.   Anesthesia Topical anesthesia was used. Anesthetic medications included Lidocaine 2%, Proparacaine 0.5%.   Procedure Preparation included 5% betadine to ocular surface, eyelid speculum. A supplied needle was used.   Injection:  1.25 mg Bevacizumab (AVASTIN) 1.27m/0.05mL SOLN   NDC: 501751-025-85 Lot: [id number] , Expiration date: 01/03/2021   Route: Intravitreal, Site: Right Eye, Waste: 0 mL  Post-op Post injection exam found visual acuity of at least counting fingers. The patient tolerated the procedure well. There were no complications. The patient received written and verbal post procedure care education. Post injection medications were not given.        Intravitreal Injection, Pharmacologic Agent - OS - Left Eye       Time Out 12/14/2020. 10:45 AM. Confirmed correct patient, procedure, site, and patient consented.   Anesthesia Topical anesthesia was used. Anesthetic medications included Lidocaine 2%, Proparacaine 0.5%.   Procedure Preparation included 5% betadine to ocular surface, eyelid speculum. A (32g) needle was used.   Injection:  1.25 mg Bevacizumab (AVASTIN)  1.246m0.05mL SOLN   NDC: 5027782-423-53Lot: : [id number] date: 01/10/2021   Route: Intravitreal, Site: Left Eye, Waste: 0.05 mL  Post-op Post injection exam found visual acuity of at least counting fingers. The patient tolerated the procedure well. There were no complications. The patient received written and verbal post procedure care education. Post injection medications were not given.                 ASSESSMENT/PLAN:    ICD-10-CM   1. Moderate nonproliferative diabetic retinopathy of both eyes with macular edema associated with type 2 diabetes mellitus (HCC)  [id number] Injection, Pharmacologic Agent - OD - Right Eye    Intravitreal Injection, Pharmacologic Agent - OS - Left Eye    Bevacizumab (AVASTIN) SOLN 1.25 mg    Bevacizumab (AVASTIN) SOLN 1.25 mg  2. Retinal edema  H35.81 OCT, Retina - OU - Both Eyes  3. Lattice degeneration of both retinas  H35.413   4. Retinal holes, bilateral  H33.323   5. Essential hypertension  I10   6. Hypertensive retinopathy of both eyes  H35.033   7. Combined forms of age-related cataract of both eyes  H25.813   8. Dry eyes, bilateral  H0P95.093  1,2. Moderate non-proliferative diabetic retinopathy w/ DME OU  - Last A1c 6.7 on 10.19.21 - exam shows scattered cystic changes and scattered MA/IRH  OU - FA 09.15.21 shows late leaking MA OU, no NV OU  - OCT shows OD: Persistent IRF OS: Persistent IRF superior macula--slightly improved - BCVA improved to 20/20 OD, down to 20/25 from 20/20 OS - s/p IVA OD #1 (02.23.22), #2 (03.25.22) - s/p IVA OS #1 (02.25.22), #2 (03.25.22) - recommend IVA OU #3 today, 04.25.22 - pt wishes to proceed with injections OU  - RBA of procedure discussed, questions answered  - IVA informed consent obtained and signed, 02.23.22 (OD)  - IVA informed consent obtained and signed, 02.23.22 (OS) - see procedure note - f/u 4 weeks, DFE, OCT, possible injection(s)  3,4. Lattice degeneration w/  atrophic holes, OU - patches of inferior lattice OU; OD with small patch at 12 - discussed findings, prognosis, and treatment options including observation - s/p laser retinopexy OD (09.29.21) -- good laser surrounding - s/p laser retinopexy OS (10.13.21) -- good laser surrounidng - monitor   5,6. Hypertensive retinopathy OU - discussed importance of tight BP control - monitor  7. Mixed Cataract OU - The symptoms of cataract, surgical options, and treatments and risks were discussed with patient. - discussed diagnosis and progression - not yet visually significant - monitor for now   8. Dry eyes OU - recommend artificial tears and lubricating ointment as needed   Ophthalmic Meds Ordered this visit:  Meds ordered this encounter  Medications  . Bevacizumab (AVASTIN) SOLN 1.25 mg  . Bevacizumab (AVASTIN) SOLN 1.25 mg       Return in about 4 weeks (around 01/11/2021) for 4 wk f/u for mod NPDR w/DME OU w/DFE/OCT/poss inj. OU.  There are no Patient Instructions on file for this visit.   Explained the diagnoses, plan, and follow up with the patient and they expressed understanding.  Patient expressed understanding of the importance of proper follow up care.   This document serves as a record of services personally performed by Gardiner Sleeper, MD, PhD. It was created on their behalf by Leonie Douglas, an ophthalmic technician. The creation of this record is the provider's dictation and/or activities during the visit.    Electronically signed by: Leonie Douglas COA, 12/14/20  12:29 PM   This document serves as a record of services personally performed by Gardiner Sleeper, MD, PhD. It was created on their behalf by Estill Bakes, COT an ophthalmic technician. The creation of this record is the provider's dictation and/or activities during the visit.    Electronically signed by: Estill Bakes, COT 4.25.22 @ 12:29 PM  Gardiner Sleeper, M.D., Ph.D. Diseases & Surgery of the Retina and  [place] 04.25.22  I have reviewed the above documentation for accuracy and completeness, and I agree with the above. Gardiner Sleeper, M.D., Ph.D. 12/14/20 12:29 PM   Abbreviations: M myopia (nearsighted); A astigmatism; H hyperopia (farsighted); P presbyopia; Mrx spectacle prescription;  CTL contact lenses; OD right eye; OS left eye; OU both eyes  XT exotropia; ET esotropia; PEK punctate epithelial keratitis; PEE punctate epithelial erosions; DES dry eye syndrome; MGD meibomian gland dysfunction; ATs artificial tears; PFAT's preservative free artificial tears; [place] nuclear sclerotic cataract; PSC posterior subcapsular cataract; ERM epi-retinal membrane; PVD posterior vitreous detachment; RD retinal detachment; DM diabetes mellitus; DR diabetic retinopathy; NPDR non-proliferative diabetic retinopathy; PDR proliferative diabetic retinopathy; CSME clinically significant macular edema; DME diabetic  macular edema; dbh dot blot hemorrhages; CWS cotton wool spot; POAG primary open angle glaucoma; C/D cup-to-disc ratio; HVF humphrey visual field; GVF goldmann visual field; OCT optical coherence tomography; IOP intraocular pressure; BRVO Branch retinal vein occlusion; CRVO central retinal vein occlusion; CRAO central retinal artery occlusion; BRAO branch retinal artery occlusion; RT retinal tear; SB scleral buckle; PPV pars plana vitrectomy; VH Vitreous hemorrhage; PRP panretinal laser photocoagulation; IVK intravitreal kenalog; VMT vitreomacular traction; MH Macular hole;  NVD neovascularization of the disc; NVE neovascularization elsewhere; AREDS age related eye disease study; ARMD age related macular degeneration; POAG primary open angle glaucoma; EBMD epithelial/anterior basement membrane dystrophy; ACIOL anterior chamber intraocular lens; IOL intraocular lens; PCIOL posterior chamber intraocular lens; Phaco/IOL phacoemulsification with intraocular lens placement; [place]  photorefractive keratectomy; LASIK laser assisted in situ keratomileusis; HTN hypertension; DM diabetes mellitus; COPD chronic obstructive pulmonary disease

## 2020-12-10 ENCOUNTER — Encounter (HOSPITAL_COMMUNITY)
Admission: RE | Admit: 2020-12-10 | Discharge: 2020-12-10 | Disposition: A | Discharge status: Home / Self Care | Payer: Medicare Other | Source: Ambulatory Visit | Attending: Nephrology | Admitting: Nephrology

## 2020-12-10 ENCOUNTER — Other Ambulatory Visit: Payer: Self-pay

## 2020-12-10 DIAGNOSIS — D631 Anemia in chronic kidney disease: Secondary | ICD-10-CM | POA: Diagnosis not present

## 2020-12-10 DIAGNOSIS — N185 Chronic kidney disease, stage 5: Secondary | ICD-10-CM | POA: Diagnosis not present

## 2020-12-10 LAB — CBC WITH DIFFERENTIAL/PLATELET
Abs Immature Granulocytes: 0.04 10*3/uL (ref 0.00–0.07)
Basophils Absolute: 0.1 10*3/uL (ref 0.0–0.1)
Basophils Relative: 1 %
Eosinophils Absolute: 1 10*3/uL — ABNORMAL HIGH (ref 0.0–0.5)
Eosinophils Relative: 14 %
HCT: 26.2 % — ABNORMAL LOW (ref 36.0–46.0)
Hemoglobin: 8.3 g/dL — ABNORMAL LOW (ref 12.0–15.0)
Immature Granulocytes: 1 %
Lymphocytes Relative: 19 %
Lymphs Abs: 1.4 10*3/uL (ref 0.7–4.0)
MCH: 28.1 pg (ref 26.0–34.0)
MCHC: 31.7 g/dL (ref 30.0–36.0)
MCV: 88.8 fL (ref 80.0–100.0)
Monocytes Absolute: 0.5 10*3/uL (ref 0.1–1.0)
Monocytes Relative: 6 %
Neutro Abs: 4.5 10*3/uL (ref 1.7–7.7)
Neutrophils Relative %: 59 %
Platelets: 247 10*3/uL (ref 150–400)
RBC: 2.95 MIL/uL — ABNORMAL LOW (ref 3.87–5.11)
RDW: 14.8 % (ref 11.5–15.5)
WBC: 7.5 10*3/uL (ref 4.0–10.5)
nRBC: 0 % (ref 0.0–0.2)

## 2020-12-10 LAB — IRON AND TIBC
Iron: 92 ug/dL (ref 28–170)
Saturation Ratios: 24 % (ref 10.4–31.8)
TIBC: 376 ug/dL (ref 250–450)
UIBC: 284 ug/dL

## 2020-12-10 LAB — RENAL FUNCTION PANEL
Albumin: 3.4 g/dL — ABNORMAL LOW (ref 3.5–5.0)
Anion gap: 11 (ref 5–15)
BUN: 60 mg/dL — ABNORMAL HIGH (ref 8–23)
CO2: 21 mmol/L — ABNORMAL LOW (ref 22–32)
Calcium: 8.6 mg/dL — ABNORMAL LOW (ref 8.9–10.3)
Chloride: 105 mmol/L (ref 98–111)
Creatinine, Ser: 5.42 mg/dL — ABNORMAL HIGH (ref 0.44–1.00)
GFR, Estimated: 8 mL/min — ABNORMAL LOW (ref >60)
Glucose, Bld: 164 mg/dL — ABNORMAL HIGH (ref 70–99)
Phosphorus: 5.5 mg/dL — ABNORMAL HIGH (ref 2.5–4.6)
Potassium: 4.5 mmol/L (ref 3.5–5.1)
Sodium: 137 mmol/L (ref 135–145)

## 2020-12-10 LAB — PROTEIN / CREATININE RATIO, URINE
Creatinine, Urine: 102.99 mg/dL
Protein Creatinine Ratio: 5.96 mg/mg{Cre} — ABNORMAL HIGH (ref 0.00–0.15)
Total Protein, Urine: 614 mg/dL

## 2020-12-10 LAB — POCT HEMOGLOBIN-HEMACUE: Hemoglobin: 7.9 g/dL — ABNORMAL LOW (ref 12.0–15.0)

## 2020-12-10 MED ORDER — EPOETIN ALFA-EPBX 10000 UNIT/ML IJ SOLN
6000.0000 [IU] | Freq: Once | INTRAMUSCULAR | Status: AC
Start: 2020-12-10 — End: 2020-12-10

## 2020-12-10 MED ORDER — EPOETIN ALFA-EPBX 3000 UNIT/ML IJ SOLN
INTRAMUSCULAR | Status: AC
  Administered 2020-12-10: 6000 [IU]
  Filled 2020-12-10: qty 2

## 2020-12-14 ENCOUNTER — Other Ambulatory Visit: Payer: Self-pay

## 2020-12-14 ENCOUNTER — Encounter (INDEPENDENT_AMBULATORY_CARE_PROVIDER_SITE_OTHER): Payer: Self-pay | Admitting: Ophthalmology

## 2020-12-14 ENCOUNTER — Ambulatory Visit (INDEPENDENT_AMBULATORY_CARE_PROVIDER_SITE_OTHER): Payer: Medicare Other | Admitting: Ophthalmology

## 2020-12-14 DIAGNOSIS — H35033 Hypertensive retinopathy, bilateral: Secondary | ICD-10-CM | POA: Diagnosis not present

## 2020-12-14 DIAGNOSIS — E113313 Type 2 diabetes mellitus with moderate nonproliferative diabetic retinopathy with macular edema, bilateral: Secondary | ICD-10-CM | POA: Diagnosis not present

## 2020-12-14 DIAGNOSIS — H04123 Dry eye syndrome of bilateral lacrimal glands: Secondary | ICD-10-CM

## 2020-12-14 DIAGNOSIS — H33323 Round hole, bilateral: Secondary | ICD-10-CM | POA: Diagnosis not present

## 2020-12-14 DIAGNOSIS — H25813 Combined forms of age-related cataract, bilateral: Secondary | ICD-10-CM | POA: Diagnosis not present

## 2020-12-14 DIAGNOSIS — I10 Essential (primary) hypertension: Secondary | ICD-10-CM | POA: Diagnosis not present

## 2020-12-14 DIAGNOSIS — H35413 Lattice degeneration of retina, bilateral: Secondary | ICD-10-CM | POA: Diagnosis not present

## 2020-12-14 DIAGNOSIS — H3581 Retinal edema: Secondary | ICD-10-CM

## 2020-12-14 MED ORDER — BEVACIZUMAB CHEMO INJECTION 1.25MG/0.05ML SYRINGE FOR KALEIDOSCOPE
1.2500 mg | INTRAVITREAL | Status: AC | PRN
  Administered 2020-12-14: 1.25 mg via INTRAVITREAL

## 2020-12-19 DIAGNOSIS — I10 Essential (primary) hypertension: Secondary | ICD-10-CM | POA: Diagnosis not present

## 2020-12-21 ENCOUNTER — Encounter: Payer: Self-pay | Admitting: Gastroenterology

## 2020-12-23 DIAGNOSIS — E119 Type 2 diabetes mellitus without complications: Secondary | ICD-10-CM | POA: Diagnosis not present

## 2020-12-24 ENCOUNTER — Encounter (HOSPITAL_COMMUNITY): Payer: Self-pay

## 2020-12-24 ENCOUNTER — Encounter (HOSPITAL_COMMUNITY)
Admission: RE | Admit: 2020-12-24 | Discharge: 2020-12-24 | Disposition: A | Discharge status: Home / Self Care | Payer: Medicare Other | Source: Ambulatory Visit | Attending: Nephrology | Admitting: Nephrology

## 2020-12-24 ENCOUNTER — Other Ambulatory Visit: Payer: Self-pay

## 2020-12-24 DIAGNOSIS — N185 Chronic kidney disease, stage 5: Secondary | ICD-10-CM | POA: Insufficient documentation

## 2020-12-24 LAB — POCT HEMOGLOBIN-HEMACUE: Hemoglobin: 7.8 g/dL — ABNORMAL LOW (ref 12.0–15.0)

## 2020-12-24 MED ORDER — EPOETIN ALFA-EPBX 10000 UNIT/ML IJ SOLN
6000.0000 [IU] | Freq: Once | INTRAMUSCULAR | Status: DC
Start: 2020-12-24 — End: 2020-12-24

## 2020-12-24 MED ORDER — EPOETIN ALFA 3000 UNIT/ML IJ SOLN
INTRAMUSCULAR | Status: AC
  Administered 2020-12-24: 6000 [IU]
  Filled 2020-12-24: qty 2

## 2021-01-06 NOTE — Progress Notes (Shared)
[facility] Note  01/11/2021     CHIEF COMPLAINT Patient presents for No chief complaint on file.   HISTORY OF PRESENT ILLNESS: Cindy Park is a 65 y.o. female who presents to the clinic today for:   Pt has not noticed a difference in [place].  Referring physician: Leticia Clas OD [address], [place] [id number]  HISTORICAL INFORMATION:   Selected notes from the MEDICAL RECORD NUMBER Referred by Dr. Leticia Clas for DM eval LEE: 08.19.2021 BCVA OD: 20/20 OS: 20/20 Ocular Hx- cataract, CWS, macular edema PMH- DM, HTN, stage 3 kidney failure, CHF   CURRENT MEDICATIONS: No current outpatient medications on file. (Ophthalmic Drugs)   No current facility-administered medications for this visit. (Ophthalmic Drugs)   Current Outpatient Medications (Other)  Medication Sig  . acetaminophen (TYLENOL) 500 MG tablet Take 1,000 mg by mouth every 6 (six) hours as needed for moderate pain.   Marland Kitchen albuterol (PROVENTIL) (2.5 MG/3ML) 0.083% nebulizer solution Take 2.5 mg by nebulization 4 (four) times daily.   Marland Kitchen albuterol (VENTOLIN HFA) 108 (90 Base) MCG/ACT inhaler Inhale 2 puffs into the lungs every 6 (six) hours as needed for wheezing or shortness of breath.  Marland Kitchen amLODipine (NORVASC) 5 MG tablet Take 5 mg by mouth 2 (two) times daily.  Marland Kitchen aspirin EC 81 MG tablet Take 81 mg by mouth in the morning.  Marland Kitchen atorvastatin (LIPITOR) 40 MG tablet Take 40 mg by mouth at bedtime.   . calcitRIOL (ROCALTROL) 0.25 MCG capsule Take 0.25 mcg by mouth in the morning.  . carvedilol (COREG) 25 MG tablet Take 25 mg by mouth 2 (two) times daily with a meal.   . Cholecalciferol (VITAMIN D3) 50 MCG (2000 UT) TABS Take 2,000 Units by mouth every evening.  . clonazePAM (KLONOPIN) 0.5 MG tablet Take 0.5 mg by mouth 2 (two) times daily as needed for anxiety.  . dicyclomine (BENTYL) 10 MG capsule Take 10 mg by mouth 2 (two) times daily.   Marland Kitchen docusate sodium (COLACE) 100 MG  capsule Take 200 mg by mouth daily.  Marland Kitchen epoetin alfa (EPOGEN) 3000 UNIT/ML injection Inject 3,000 Units into the skin every 14 (fourteen) days.   Marland Kitchen epoetin alfa-epbx (RETACRIT) 37169 UNIT/ML injection 6,000 Units every 14 (fourteen) days.  . ferrous sulfate 325 (65 FE) MG tablet Take 325 mg by mouth 2 (two) times daily with a meal.  . Fluticasone-Umeclidin-Vilant (TRELEGY ELLIPTA) 100-62.5-25 MCG/INH AEPB Inhale 1 puff into the lungs daily.  . furosemide (LASIX) 80 MG tablet Take 80 mg by mouth 2 (two) times daily.  Marland Kitchen gabapentin (NEURONTIN) 300 MG capsule Take 300 mg by mouth 2 (two) times daily.  Marland Kitchen gemfibrozil (LOPID) 600 MG tablet Take 600 mg by mouth in the morning and at bedtime.  . Insulin Lispro Prot & Lispro (HUMALOG MIX 75/25 [place]) Inject 15-60 Units into the skin See admin instructions. Inject 60 units into the skin in the morning and 15 units at lunch and 60 units at supper  . levothyroxine (SYNTHROID) 50 MCG tablet Take 50 mcg by mouth daily before breakfast.   . montelukast (SINGULAIR) 10 MG tablet Take 10 mg by mouth at bedtime.  Marland Kitchen omeprazole (PRILOSEC) 20 MG capsule Take 20 mg by mouth every evening.  . ondansetron (ZOFRAN) 4 MG tablet Take 4 mg by mouth every 8 (eight) hours as needed for nausea or vomiting.  Marland Kitchen oxyCODONE-acetaminophen (PERCOCET) 5-325 MG tablet Take 1 tablet by  mouth every 6 (six) hours as needed for severe pain.  . sevelamer carbonate (RENVELA) 800 MG tablet Take by mouth.  . sitaGLIPtin (JANUVIA) 25 MG tablet Take 25 mg by mouth in the morning.  . sodium bicarbonate 650 MG tablet Take 650 mg by mouth 2 (two) times daily.  Marland Kitchen triamcinolone (KENALOG) 0.1 % Apply 1 application topically daily as needed (skin irritation). To feet  . Vitamin A 2400 MCG (8000 UT) TABS Take 2,400 mcg by mouth every evening.  . vitamin B-12 (CYANOCOBALAMIN) 500 MCG tablet Take 500 mcg by mouth in the morning.  . vitamin E 180 MG (400 UNITS) capsule Take 400 Units by mouth daily.   No  current facility-administered medications for this visit. (Other)      REVIEW OF SYSTEMS:    ALLERGIES Allergies  Allergen Reactions  . Latex Shortness Of Breath and Rash    With powder   . Niacin Other (See Comments)    Burning  . Other Dermatitis    Powder in the gloves   . Tomato Other (See Comments)    Breaks pt out   . Bactrim [Sulfamethoxazole-Trimethoprim] Swelling and Rash    PAST MEDICAL HISTORY Past Medical History:  Diagnosis Date  . Anemia   . Arthritis   . Asthma   . Cataract    Combined form OU  . COPD (chronic obstructive pulmonary disease) ([place])   . Diabetes mellitus without complication ([place])   . Diabetic retinopathy ([place])    BDR OU  . Family history of adverse reaction to anesthesia    sister- difficult to wake up from anesthesia  . Fluid excess   . GERD (gastroesophageal reflux disease)   . Goiter   . Hypertension   . Hypertensive retinopathy    OU  . Renal failure    Past Surgical History:  Procedure Laterality Date  . ABDOMINAL HYSTERECTOMY    . AV FISTULA PLACEMENT Left 06/11/2020   Procedure: LEFT ARM ARTERIOVENOUS (AV) FISTULA CREATION;  Surgeon: [name], MD;  Location: [facility];  Service: Vascular;  Laterality: Left;  . BASCILIC VEIN TRANSPOSITION Left 08/06/2020   Procedure: LEFT ARM SECOND STAGE BASCILIC VEIN TRANSPOSITION;  Surgeon: [name], MD;  Location: [facility];  Service: Vascular;  Laterality: Left;  . BIOPSY  12/02/2020   Procedure: BIOPSY;  Surgeon: Daneil Dolin, MD;  Location: [facility];  Service: Endoscopy;;  gastric  . CHOLECYSTECTOMY    . COLONOSCOPY N/A 03/25/2020   Rourk: two tubular adenomas removed. next colonoscopy five years  . ESOPHAGOGASTRODUODENOSCOPY N/A 12/02/2020   Procedure: ESOPHAGOGASTRODUODENOSCOPY (EGD);  Surgeon: Daneil Dolin, MD;  [facility];  Service: Endoscopy;  Laterality: N/A;  am appt  . POLYPECTOMY  03/25/2020   Procedure: POLYPECTOMY;  Surgeon: Daneil Dolin, MD;   [facility];  Service: Endoscopy;;  . POLYPECTOMY  12/02/2020   Procedure: POLYPECTOMY;  Surgeon: Daneil Dolin, MD;  [facility];  Service: Endoscopy;;  gastric    FAMILY HISTORY Family History  Problem Relation Age of Onset  . Kidney disease Mother        ESRD  . Kidney disease Sister        ESRD  . Colon polyps Brother 35  . Kidney disease Maternal Grandmother        ESRD  . Colon cancer Neg Hx     SOCIAL HISTORY Social History   Tobacco Use  . Smoking status: Never Smoker  .  Smokeless tobacco: Never Used  Vaping Use  . Vaping Use: Never used  Substance Use Topics  . Alcohol use: No  . Drug use: No         OPHTHALMIC EXAM:  Not recorded     IMAGING AND PROCEDURES  Imaging and Procedures for 01/11/2021           ASSESSMENT/PLAN:    ICD-10-CM   1. Moderate nonproliferative diabetic retinopathy of both eyes with macular edema associated with type 2 diabetes mellitus ([place])  T51.7616   2. Retinal edema  H35.81   3. Lattice degeneration of both retinas  H35.413   4. Retinal holes, bilateral  H33.323   5. Essential hypertension  I10   6. Hypertensive retinopathy of both eyes  H35.033   7. Combined forms of age-related cataract of both eyes  H25.813   8. Dry eyes, bilateral  H04.123   9. Moderate nonproliferative diabetic retinopathy of both eyes with macular edema associated with diabetes mellitus due to underlying condition ([place])  W73.7106     1,2. Moderate non-proliferative diabetic retinopathy w/ DME OU  - Last A1c 6.7 on 10.19.21 - exam shows scattered cystic changes and scattered MA/IRH  OU - FA 09.15.21 shows late leaking MA OU, no NV OU  - OCT shows OD: Persistent IRF OS: Persistent IRF superior macula--slightly improved - BCVA improved to 20/20 OD, down to 20/25 from 20/20 OS - s/p IVA OD #1 (02.23.22), #2 (03.25.22), #3 (04.25.22) - s/p IVA OS #1 (02.25.22), #2 (03.25.22), #3 (04.25.22) - recommend IVA OU #4 today,  05.23.22 - pt wishes to proceed with injections OU  - RBA of procedure discussed, questions answered  - IVA informed consent obtained and signed, 02.23.22 (OD)  - IVA informed consent obtained and signed, 02.23.22 (OS) - see procedure note - f/u 4 weeks, DFE, OCT, possible injection(s)  3,4. Lattice degeneration w/ atrophic holes, OU - patches of inferior lattice OU; OD with small patch at 12 - discussed findings, prognosis, and treatment options including observation - s/p laser retinopexy OD (09.29.21) -- good laser surrounding - s/p laser retinopexy OS (10.13.21) -- good laser surrounidng - monitor   5,6. Hypertensive retinopathy OU - discussed importance of tight BP control - monitor  7. Mixed Cataract OU - The symptoms of cataract, surgical options, and treatments and risks were discussed with patient. - discussed diagnosis and progression - not yet visually significant - monitor for now   8. Dry eyes OU - recommend artificial tears and lubricating ointment as needed   Ophthalmic Meds Ordered this visit:  No orders of the defined types were placed in this encounter.      No follow-ups on file.  There are no Patient Instructions on file for this visit.  This document serves as a record of services personally performed by Gardiner Sleeper, MD, PhD. It was created on their behalf by Leeann Must, [place], an ophthalmic technician. The creation of this record is the provider's dictation and/or activities during the visit.    Electronically signed by: Leeann Must, COA @TODAY @ 9:52 AM   Abbreviations: M myopia (nearsighted); A astigmatism; H hyperopia (farsighted); P presbyopia; Mrx spectacle prescription;  CTL contact lenses; OD right eye; OS left eye; OU both eyes  XT exotropia; ET esotropia; PEK punctate epithelial keratitis; PEE punctate epithelial erosions; DES dry eye syndrome; MGD meibomian gland dysfunction; ATs artificial tears; PFAT's preservative free artificial  tears; [place] nuclear sclerotic cataract; PSC posterior subcapsular cataract; ERM epi-retinal membrane; PVD  posterior vitreous detachment; RD retinal detachment; DM diabetes mellitus; DR diabetic retinopathy; NPDR non-proliferative diabetic retinopathy; PDR proliferative diabetic retinopathy; CSME clinically significant macular edema; DME diabetic macular edema; dbh dot blot hemorrhages; CWS cotton wool spot; POAG primary open angle glaucoma; C/D cup-to-disc ratio; HVF humphrey visual field; GVF goldmann visual field; OCT optical coherence tomography; IOP intraocular pressure; BRVO Branch retinal vein occlusion; CRVO central retinal vein occlusion; CRAO central retinal artery occlusion; BRAO branch retinal artery occlusion; RT retinal tear; SB scleral buckle; PPV pars plana vitrectomy; VH Vitreous hemorrhage; PRP panretinal laser photocoagulation; IVK intravitreal kenalog; VMT vitreomacular traction; MH Macular hole;  NVD neovascularization of the disc; NVE neovascularization elsewhere; AREDS age related eye disease study; ARMD age related macular degeneration; POAG primary open angle glaucoma; EBMD epithelial/anterior basement membrane dystrophy; ACIOL anterior chamber intraocular lens; IOL intraocular lens; PCIOL posterior chamber intraocular lens; Phaco/IOL phacoemulsification with intraocular lens placement; [place] photorefractive keratectomy; LASIK laser assisted in situ keratomileusis; HTN hypertension; DM diabetes mellitus; COPD chronic obstructive pulmonary disease

## 2021-01-07 ENCOUNTER — Encounter (HOSPITAL_COMMUNITY): Payer: Self-pay

## 2021-01-07 ENCOUNTER — Other Ambulatory Visit: Payer: Self-pay

## 2021-01-07 ENCOUNTER — Encounter (HOSPITAL_COMMUNITY)
Admission: RE | Admit: 2021-01-07 | Discharge: 2021-01-07 | Disposition: A | Discharge status: Home / Self Care | Payer: Medicare Other | Source: Ambulatory Visit | Attending: Nephrology | Admitting: Nephrology

## 2021-01-07 DIAGNOSIS — N185 Chronic kidney disease, stage 5: Secondary | ICD-10-CM | POA: Diagnosis not present

## 2021-01-07 LAB — CBC WITH DIFFERENTIAL/PLATELET
Abs Immature Granulocytes: 0.06 10*3/uL (ref 0.00–0.07)
Basophils Absolute: 0 10*3/uL (ref 0.0–0.1)
Basophils Relative: 0 %
Eosinophils Absolute: 0.4 10*3/uL (ref 0.0–0.5)
Eosinophils Relative: 6 %
HCT: 25.7 % — ABNORMAL LOW (ref 36.0–46.0)
Hemoglobin: 8.1 g/dL — ABNORMAL LOW (ref 12.0–15.0)
Immature Granulocytes: 1 %
Lymphocytes Relative: 20 %
Lymphs Abs: 1.5 10*3/uL (ref 0.7–4.0)
MCH: 28.5 pg (ref 26.0–34.0)
MCHC: 31.5 g/dL (ref 30.0–36.0)
MCV: 90.5 fL (ref 80.0–100.0)
Monocytes Absolute: 0.5 10*3/uL (ref 0.1–1.0)
Monocytes Relative: 7 %
Neutro Abs: 4.8 10*3/uL (ref 1.7–7.7)
Neutrophils Relative %: 66 %
Platelets: 220 10*3/uL (ref 150–400)
RBC: 2.84 MIL/uL — ABNORMAL LOW (ref 3.87–5.11)
RDW: 14.7 % (ref 11.5–15.5)
WBC: 7.3 10*3/uL (ref 4.0–10.5)
nRBC: 0 % (ref 0.0–0.2)

## 2021-01-07 LAB — PROTEIN / CREATININE RATIO, URINE
Creatinine, Urine: 47.22 mg/dL
Protein Creatinine Ratio: 5.06 mg/mg{Cre} — ABNORMAL HIGH (ref 0.00–0.15)
Total Protein, Urine: 239 mg/dL

## 2021-01-07 LAB — RENAL FUNCTION PANEL
Albumin: 3.4 g/dL — ABNORMAL LOW (ref 3.5–5.0)
Anion gap: 11 (ref 5–15)
BUN: 59 mg/dL — ABNORMAL HIGH (ref 8–23)
CO2: 18 mmol/L — ABNORMAL LOW (ref 22–32)
Calcium: 8.4 mg/dL — ABNORMAL LOW (ref 8.9–10.3)
Chloride: 106 mmol/L (ref 98–111)
Creatinine, Ser: 5.49 mg/dL — ABNORMAL HIGH (ref 0.44–1.00)
GFR, Estimated: 8 mL/min — ABNORMAL LOW (ref >60)
Glucose, Bld: 168 mg/dL — ABNORMAL HIGH (ref 70–99)
Phosphorus: 4.9 mg/dL — ABNORMAL HIGH (ref 2.5–4.6)
Potassium: 4.5 mmol/L (ref 3.5–5.1)
Sodium: 135 mmol/L (ref 135–145)

## 2021-01-07 LAB — IRON AND TIBC
Iron: 95 ug/dL (ref 28–170)
Saturation Ratios: 26 % (ref 10.4–31.8)
TIBC: 359 ug/dL (ref 250–450)
UIBC: 264 ug/dL

## 2021-01-07 LAB — POCT HEMOGLOBIN-HEMACUE: Hemoglobin: 8.2 g/dL — ABNORMAL LOW (ref 12.0–15.0)

## 2021-01-07 MED ORDER — EPOETIN ALFA-EPBX 3000 UNIT/ML IJ SOLN
6000.0000 [IU] | Freq: Once | INTRAMUSCULAR | Status: AC
  Administered 2021-01-07: 6000 [IU] via SUBCUTANEOUS
  Filled 2021-01-07: qty 2

## 2021-01-08 LAB — PTH, INTACT AND CALCIUM
Calcium, Total (PTH): 8.3 mg/dL — ABNORMAL LOW (ref 8.7–10.3)
PTH: 178 pg/mL — ABNORMAL HIGH (ref 15–65)

## 2021-01-11 ENCOUNTER — Encounter (INDEPENDENT_AMBULATORY_CARE_PROVIDER_SITE_OTHER): Payer: Medicare Other | Admitting: Ophthalmology

## 2021-01-11 DIAGNOSIS — E113313 Type 2 diabetes mellitus with moderate nonproliferative diabetic retinopathy with macular edema, bilateral: Secondary | ICD-10-CM

## 2021-01-11 DIAGNOSIS — H35033 Hypertensive retinopathy, bilateral: Secondary | ICD-10-CM

## 2021-01-11 DIAGNOSIS — H33323 Round hole, bilateral: Secondary | ICD-10-CM

## 2021-01-11 DIAGNOSIS — H25813 Combined forms of age-related cataract, bilateral: Secondary | ICD-10-CM

## 2021-01-11 DIAGNOSIS — E083313 Diabetes mellitus due to underlying condition with moderate nonproliferative diabetic retinopathy with macular edema, bilateral: Secondary | ICD-10-CM

## 2021-01-11 DIAGNOSIS — I10 Essential (primary) hypertension: Secondary | ICD-10-CM

## 2021-01-11 DIAGNOSIS — H35413 Lattice degeneration of retina, bilateral: Secondary | ICD-10-CM

## 2021-01-11 DIAGNOSIS — H04123 Dry eye syndrome of bilateral lacrimal glands: Secondary | ICD-10-CM

## 2021-01-11 DIAGNOSIS — H3581 Retinal edema: Secondary | ICD-10-CM

## 2021-01-18 DIAGNOSIS — E1129 Type 2 diabetes mellitus with other diabetic kidney complication: Secondary | ICD-10-CM | POA: Diagnosis not present

## 2021-01-18 DIAGNOSIS — J441 Chronic obstructive pulmonary disease with (acute) exacerbation: Secondary | ICD-10-CM | POA: Diagnosis not present

## 2021-01-18 DIAGNOSIS — E039 Hypothyroidism, unspecified: Secondary | ICD-10-CM | POA: Diagnosis not present

## 2021-01-18 DIAGNOSIS — N183 Chronic kidney disease, stage 3 unspecified: Secondary | ICD-10-CM | POA: Diagnosis not present

## 2021-01-19 DIAGNOSIS — N184 Chronic kidney disease, stage 4 (severe): Secondary | ICD-10-CM | POA: Diagnosis not present

## 2021-01-19 DIAGNOSIS — E039 Hypothyroidism, unspecified: Secondary | ICD-10-CM | POA: Diagnosis not present

## 2021-01-19 DIAGNOSIS — E1122 Type 2 diabetes mellitus with diabetic chronic kidney disease: Secondary | ICD-10-CM | POA: Diagnosis not present

## 2021-01-19 DIAGNOSIS — Z299 Encounter for prophylactic measures, unspecified: Secondary | ICD-10-CM | POA: Diagnosis not present

## 2021-01-19 DIAGNOSIS — E1165 Type 2 diabetes mellitus with hyperglycemia: Secondary | ICD-10-CM | POA: Diagnosis not present

## 2021-01-19 DIAGNOSIS — I10 Essential (primary) hypertension: Secondary | ICD-10-CM | POA: Diagnosis not present

## 2021-01-20 DIAGNOSIS — E211 Secondary hyperparathyroidism, not elsewhere classified: Secondary | ICD-10-CM | POA: Diagnosis not present

## 2021-01-20 DIAGNOSIS — N185 Chronic kidney disease, stage 5: Secondary | ICD-10-CM | POA: Diagnosis not present

## 2021-01-20 DIAGNOSIS — E872 Acidosis: Secondary | ICD-10-CM | POA: Diagnosis not present

## 2021-01-20 DIAGNOSIS — D631 Anemia in chronic kidney disease: Secondary | ICD-10-CM | POA: Diagnosis not present

## 2021-01-20 DIAGNOSIS — E1122 Type 2 diabetes mellitus with diabetic chronic kidney disease: Secondary | ICD-10-CM | POA: Diagnosis not present

## 2021-01-20 DIAGNOSIS — E1129 Type 2 diabetes mellitus with other diabetic kidney complication: Secondary | ICD-10-CM | POA: Diagnosis not present

## 2021-01-20 DIAGNOSIS — I129 Hypertensive chronic kidney disease with stage 1 through stage 4 chronic kidney disease, or unspecified chronic kidney disease: Secondary | ICD-10-CM | POA: Diagnosis not present

## 2021-01-20 DIAGNOSIS — R809 Proteinuria, unspecified: Secondary | ICD-10-CM | POA: Diagnosis not present

## 2021-01-20 DIAGNOSIS — N189 Chronic kidney disease, unspecified: Secondary | ICD-10-CM | POA: Diagnosis not present

## 2021-01-21 ENCOUNTER — Other Ambulatory Visit: Payer: Self-pay

## 2021-01-21 ENCOUNTER — Encounter (HOSPITAL_COMMUNITY): Payer: Self-pay

## 2021-01-21 ENCOUNTER — Encounter (HOSPITAL_COMMUNITY)
Admission: RE | Admit: 2021-01-21 | Discharge: 2021-01-21 | Disposition: A | Discharge status: Home / Self Care | Payer: Medicare Other | Source: Ambulatory Visit | Attending: Nephrology | Admitting: Nephrology

## 2021-01-21 DIAGNOSIS — N185 Chronic kidney disease, stage 5: Secondary | ICD-10-CM | POA: Diagnosis not present

## 2021-01-21 LAB — POCT HEMOGLOBIN-HEMACUE: Hemoglobin: 8.4 g/dL — ABNORMAL LOW (ref 12.0–15.0)

## 2021-01-21 MED ORDER — EPOETIN ALFA-EPBX 10000 UNIT/ML IJ SOLN: 6000.0000 [IU] | Freq: Once | INTRAMUSCULAR | Status: DC

## 2021-01-21 MED ORDER — EPOETIN ALFA-EPBX 3000 UNIT/ML IJ SOLN
INTRAMUSCULAR | Status: AC
  Administered 2021-01-21: 6000 [IU]
  Filled 2021-01-21: qty 2

## 2021-02-02 DIAGNOSIS — J323 Chronic sphenoidal sinusitis: Secondary | ICD-10-CM | POA: Diagnosis not present

## 2021-02-02 DIAGNOSIS — Z20822 Contact with and (suspected) exposure to covid-19: Secondary | ICD-10-CM | POA: Diagnosis not present

## 2021-02-02 DIAGNOSIS — R001 Bradycardia, unspecified: Secondary | ICD-10-CM | POA: Diagnosis not present

## 2021-02-02 DIAGNOSIS — Z91040 Latex allergy status: Secondary | ICD-10-CM | POA: Diagnosis not present

## 2021-02-02 DIAGNOSIS — R9431 Abnormal electrocardiogram [ECG] [EKG]: Secondary | ICD-10-CM | POA: Diagnosis not present

## 2021-02-02 DIAGNOSIS — Z882 Allergy status to sulfonamides status: Secondary | ICD-10-CM | POA: Diagnosis not present

## 2021-02-02 DIAGNOSIS — E162 Hypoglycemia, unspecified: Secondary | ICD-10-CM | POA: Diagnosis not present

## 2021-02-02 DIAGNOSIS — R404 Transient alteration of awareness: Secondary | ICD-10-CM | POA: Diagnosis not present

## 2021-02-02 DIAGNOSIS — J3489 Other specified disorders of nose and nasal sinuses: Secondary | ICD-10-CM | POA: Diagnosis not present

## 2021-02-02 DIAGNOSIS — N179 Acute kidney failure, unspecified: Secondary | ICD-10-CM | POA: Diagnosis not present

## 2021-02-02 DIAGNOSIS — T68XXXA Hypothermia, initial encounter: Secondary | ICD-10-CM | POA: Diagnosis not present

## 2021-02-02 DIAGNOSIS — I5032 Chronic diastolic (congestive) heart failure: Secondary | ICD-10-CM | POA: Diagnosis not present

## 2021-02-02 DIAGNOSIS — R4020 Unspecified coma: Secondary | ICD-10-CM | POA: Diagnosis not present

## 2021-02-02 DIAGNOSIS — S0993XA Unspecified injury of face, initial encounter: Secondary | ICD-10-CM | POA: Diagnosis not present

## 2021-02-02 DIAGNOSIS — M6282 Rhabdomyolysis: Secondary | ICD-10-CM | POA: Diagnosis not present

## 2021-02-02 DIAGNOSIS — E11649 Type 2 diabetes mellitus with hypoglycemia without coma: Secondary | ICD-10-CM | POA: Diagnosis not present

## 2021-02-02 DIAGNOSIS — E213 Hyperparathyroidism, unspecified: Secondary | ICD-10-CM | POA: Diagnosis not present

## 2021-02-02 DIAGNOSIS — N184 Chronic kidney disease, stage 4 (severe): Secondary | ICD-10-CM | POA: Diagnosis not present

## 2021-02-02 DIAGNOSIS — J449 Chronic obstructive pulmonary disease, unspecified: Secondary | ICD-10-CM | POA: Diagnosis not present

## 2021-02-02 DIAGNOSIS — Z79899 Other long term (current) drug therapy: Secondary | ICD-10-CM | POA: Diagnosis not present

## 2021-02-02 DIAGNOSIS — E785 Hyperlipidemia, unspecified: Secondary | ICD-10-CM | POA: Diagnosis not present

## 2021-02-02 DIAGNOSIS — Z743 Need for continuous supervision: Secondary | ICD-10-CM | POA: Diagnosis not present

## 2021-02-02 DIAGNOSIS — Z794 Long term (current) use of insulin: Secondary | ICD-10-CM | POA: Diagnosis not present

## 2021-02-02 DIAGNOSIS — E161 Other hypoglycemia: Secondary | ICD-10-CM | POA: Diagnosis not present

## 2021-02-02 DIAGNOSIS — E1122 Type 2 diabetes mellitus with diabetic chronic kidney disease: Secondary | ICD-10-CM | POA: Diagnosis not present

## 2021-02-02 DIAGNOSIS — R262 Difficulty in walking, not elsewhere classified: Secondary | ICD-10-CM | POA: Diagnosis not present

## 2021-02-02 DIAGNOSIS — Z7982 Long term (current) use of aspirin: Secondary | ICD-10-CM | POA: Diagnosis not present

## 2021-02-02 DIAGNOSIS — S0990XA Unspecified injury of head, initial encounter: Secondary | ICD-10-CM | POA: Diagnosis not present

## 2021-02-02 DIAGNOSIS — Z87891 Personal history of nicotine dependence: Secondary | ICD-10-CM | POA: Diagnosis not present

## 2021-02-02 DIAGNOSIS — I130 Hypertensive heart and chronic kidney disease with heart failure and stage 1 through stage 4 chronic kidney disease, or unspecified chronic kidney disease: Secondary | ICD-10-CM | POA: Diagnosis not present

## 2021-02-02 DIAGNOSIS — I313 Pericardial effusion (noninflammatory): Secondary | ICD-10-CM | POA: Diagnosis not present

## 2021-02-03 DIAGNOSIS — I517 Cardiomegaly: Secondary | ICD-10-CM | POA: Diagnosis not present

## 2021-02-03 DIAGNOSIS — I313 Pericardial effusion (noninflammatory): Secondary | ICD-10-CM | POA: Diagnosis not present

## 2021-02-03 DIAGNOSIS — N179 Acute kidney failure, unspecified: Secondary | ICD-10-CM | POA: Diagnosis not present

## 2021-02-03 DIAGNOSIS — I5032 Chronic diastolic (congestive) heart failure: Secondary | ICD-10-CM | POA: Diagnosis not present

## 2021-02-03 DIAGNOSIS — J811 Chronic pulmonary edema: Secondary | ICD-10-CM | POA: Diagnosis not present

## 2021-02-03 DIAGNOSIS — I5189 Other ill-defined heart diseases: Secondary | ICD-10-CM | POA: Diagnosis not present

## 2021-02-03 DIAGNOSIS — E162 Hypoglycemia, unspecified: Secondary | ICD-10-CM | POA: Diagnosis not present

## 2021-02-04 ENCOUNTER — Encounter (HOSPITAL_COMMUNITY)
Admission: RE | Admit: 2021-02-04 | Discharge: 2021-02-04 | Disposition: A | Discharge status: Home / Self Care | Payer: Medicare Other | Source: Ambulatory Visit | Attending: Nephrology | Admitting: Nephrology

## 2021-02-04 ENCOUNTER — Encounter (INDEPENDENT_AMBULATORY_CARE_PROVIDER_SITE_OTHER): Payer: Medicare Other | Admitting: Ophthalmology

## 2021-02-04 DIAGNOSIS — E162 Hypoglycemia, unspecified: Secondary | ICD-10-CM | POA: Diagnosis not present

## 2021-02-04 DIAGNOSIS — N179 Acute kidney failure, unspecified: Secondary | ICD-10-CM | POA: Diagnosis not present

## 2021-02-04 DIAGNOSIS — I313 Pericardial effusion (noninflammatory): Secondary | ICD-10-CM | POA: Diagnosis not present

## 2021-02-04 DIAGNOSIS — I5032 Chronic diastolic (congestive) heart failure: Secondary | ICD-10-CM | POA: Diagnosis not present

## 2021-02-05 ENCOUNTER — Inpatient Hospital Stay (HOSPITAL_COMMUNITY)
Admission: AD | Admit: 2021-02-05 | Discharge: 2021-02-10 | DRG: 638 | Disposition: A | Discharge status: Home / Self Care | Payer: Medicare Other | Source: Other Acute Inpatient Hospital | Attending: Internal Medicine | Admitting: Internal Medicine

## 2021-02-05 ENCOUNTER — Encounter (HOSPITAL_COMMUNITY): Payer: Self-pay | Admitting: Internal Medicine

## 2021-02-05 ENCOUNTER — Other Ambulatory Visit: Payer: Self-pay

## 2021-02-05 DIAGNOSIS — E1129 Type 2 diabetes mellitus with other diabetic kidney complication: Secondary | ICD-10-CM | POA: Diagnosis not present

## 2021-02-05 DIAGNOSIS — Z79899 Other long term (current) drug therapy: Secondary | ICD-10-CM

## 2021-02-05 DIAGNOSIS — E162 Hypoglycemia, unspecified: Secondary | ICD-10-CM | POA: Diagnosis not present

## 2021-02-05 DIAGNOSIS — I517 Cardiomegaly: Secondary | ICD-10-CM | POA: Diagnosis not present

## 2021-02-05 DIAGNOSIS — Z882 Allergy status to sulfonamides status: Secondary | ICD-10-CM | POA: Diagnosis not present

## 2021-02-05 DIAGNOSIS — Z992 Dependence on renal dialysis: Secondary | ICD-10-CM | POA: Diagnosis not present

## 2021-02-05 DIAGNOSIS — J449 Chronic obstructive pulmonary disease, unspecified: Secondary | ICD-10-CM | POA: Diagnosis not present

## 2021-02-05 DIAGNOSIS — Z7982 Long term (current) use of aspirin: Secondary | ICD-10-CM

## 2021-02-05 DIAGNOSIS — Z91040 Latex allergy status: Secondary | ICD-10-CM

## 2021-02-05 DIAGNOSIS — D631 Anemia in chronic kidney disease: Secondary | ICD-10-CM | POA: Diagnosis present

## 2021-02-05 DIAGNOSIS — Z90710 Acquired absence of both cervix and uterus: Secondary | ICD-10-CM

## 2021-02-05 DIAGNOSIS — I120 Hypertensive chronic kidney disease with stage 5 chronic kidney disease or end stage renal disease: Secondary | ICD-10-CM | POA: Diagnosis not present

## 2021-02-05 DIAGNOSIS — N186 End stage renal disease: Secondary | ICD-10-CM | POA: Diagnosis not present

## 2021-02-05 DIAGNOSIS — Z881 Allergy status to other antibiotic agents status: Secondary | ICD-10-CM

## 2021-02-05 DIAGNOSIS — H35039 Hypertensive retinopathy, unspecified eye: Secondary | ICD-10-CM | POA: Diagnosis not present

## 2021-02-05 DIAGNOSIS — I130 Hypertensive heart and chronic kidney disease with heart failure and stage 1 through stage 4 chronic kidney disease, or unspecified chronic kidney disease: Secondary | ICD-10-CM | POA: Diagnosis not present

## 2021-02-05 DIAGNOSIS — E1122 Type 2 diabetes mellitus with diabetic chronic kidney disease: Secondary | ICD-10-CM | POA: Diagnosis present

## 2021-02-05 DIAGNOSIS — Z91018 Allergy to other foods: Secondary | ICD-10-CM

## 2021-02-05 DIAGNOSIS — Z87891 Personal history of nicotine dependence: Secondary | ICD-10-CM | POA: Diagnosis not present

## 2021-02-05 DIAGNOSIS — N179 Acute kidney failure, unspecified: Secondary | ICD-10-CM | POA: Diagnosis not present

## 2021-02-05 DIAGNOSIS — E213 Hyperparathyroidism, unspecified: Secondary | ICD-10-CM | POA: Diagnosis not present

## 2021-02-05 DIAGNOSIS — E11319 Type 2 diabetes mellitus with unspecified diabetic retinopathy without macular edema: Secondary | ICD-10-CM | POA: Diagnosis present

## 2021-02-05 DIAGNOSIS — E039 Hypothyroidism, unspecified: Secondary | ICD-10-CM | POA: Diagnosis present

## 2021-02-05 DIAGNOSIS — Z20822 Contact with and (suspected) exposure to covid-19: Secondary | ICD-10-CM | POA: Diagnosis present

## 2021-02-05 DIAGNOSIS — I3139 Other pericardial effusion (noninflammatory): Secondary | ICD-10-CM

## 2021-02-05 DIAGNOSIS — I313 Pericardial effusion (noninflammatory): Secondary | ICD-10-CM | POA: Diagnosis not present

## 2021-02-05 DIAGNOSIS — Z841 Family history of disorders of kidney and ureter: Secondary | ICD-10-CM | POA: Diagnosis not present

## 2021-02-05 DIAGNOSIS — Z7984 Long term (current) use of oral hypoglycemic drugs: Secondary | ICD-10-CM

## 2021-02-05 DIAGNOSIS — E11649 Type 2 diabetes mellitus with hypoglycemia without coma: Principal | ICD-10-CM | POA: Diagnosis present

## 2021-02-05 DIAGNOSIS — T383X5A Adverse effect of insulin and oral hypoglycemic [antidiabetic] drugs, initial encounter: Secondary | ICD-10-CM | POA: Diagnosis present

## 2021-02-05 DIAGNOSIS — Z9109 Other allergy status, other than to drugs and biological substances: Secondary | ICD-10-CM

## 2021-02-05 DIAGNOSIS — E8889 Other specified metabolic disorders: Secondary | ICD-10-CM | POA: Diagnosis present

## 2021-02-05 DIAGNOSIS — R001 Bradycardia, unspecified: Secondary | ICD-10-CM | POA: Diagnosis not present

## 2021-02-05 DIAGNOSIS — N184 Chronic kidney disease, stage 4 (severe): Secondary | ICD-10-CM | POA: Diagnosis not present

## 2021-02-05 DIAGNOSIS — N250 Renal osteodystrophy: Secondary | ICD-10-CM | POA: Diagnosis not present

## 2021-02-05 DIAGNOSIS — Z452 Encounter for adjustment and management of vascular access device: Secondary | ICD-10-CM

## 2021-02-05 DIAGNOSIS — I5032 Chronic diastolic (congestive) heart failure: Secondary | ICD-10-CM | POA: Diagnosis not present

## 2021-02-05 DIAGNOSIS — Z794 Long term (current) use of insulin: Secondary | ICD-10-CM

## 2021-02-05 DIAGNOSIS — E785 Hyperlipidemia, unspecified: Secondary | ICD-10-CM | POA: Diagnosis present

## 2021-02-05 DIAGNOSIS — K219 Gastro-esophageal reflux disease without esophagitis: Secondary | ICD-10-CM | POA: Diagnosis not present

## 2021-02-05 DIAGNOSIS — Z79890 Hormone replacement therapy (postmenopausal): Secondary | ICD-10-CM

## 2021-02-05 DIAGNOSIS — Z7951 Long term (current) use of inhaled steroids: Secondary | ICD-10-CM

## 2021-02-05 DIAGNOSIS — R9431 Abnormal electrocardiogram [ECG] [EKG]: Secondary | ICD-10-CM | POA: Diagnosis not present

## 2021-02-05 DIAGNOSIS — E160 Drug-induced hypoglycemia without coma: Secondary | ICD-10-CM

## 2021-02-05 DIAGNOSIS — R262 Difficulty in walking, not elsewhere classified: Secondary | ICD-10-CM | POA: Diagnosis not present

## 2021-02-05 DIAGNOSIS — R55 Syncope and collapse: Secondary | ICD-10-CM | POA: Diagnosis present

## 2021-02-05 LAB — SARS CORONAVIRUS 2 (TAT 6-24 HRS): SARS Coronavirus 2: NEGATIVE

## 2021-02-05 MED ORDER — CYANOCOBALAMIN 500 MCG PO TABS
2500.0000 ug | ORAL_TABLET | Freq: Every day | ORAL | Status: DC
  Administered 2021-02-06 – 2021-02-10 (×6): 2500 ug via ORAL
  Filled 2021-02-05 (×6): qty 5

## 2021-02-05 MED ORDER — LEVOTHYROXINE SODIUM 50 MCG PO TABS
50.0000 ug | ORAL_TABLET | Freq: Every day | ORAL | Status: DC
  Administered 2021-02-06 – 2021-02-10 (×5): 50 ug via ORAL
  Filled 2021-02-05 (×5): qty 1

## 2021-02-05 MED ORDER — ALBUTEROL SULFATE (2.5 MG/3ML) 0.083% IN NEBU: 2.5000 mg | INHALATION_SOLUTION | Freq: Four times a day (QID) | RESPIRATORY_TRACT | Status: DC | PRN

## 2021-02-05 MED ORDER — DICYCLOMINE HCL 10 MG PO CAPS
10.0000 mg | ORAL_CAPSULE | Freq: Two times a day (BID) | ORAL | Status: DC
  Administered 2021-02-06 – 2021-02-10 (×10): 10 mg via ORAL
  Filled 2021-02-05 (×10): qty 1

## 2021-02-05 MED ORDER — CARVEDILOL 12.5 MG PO TABS
12.5000 mg | ORAL_TABLET | Freq: Two times a day (BID) | ORAL | Status: DC
  Administered 2021-02-06 – 2021-02-10 (×9): 12.5 mg via ORAL
  Filled 2021-02-05 (×10): qty 1

## 2021-02-05 MED ORDER — LINAGLIPTIN 5 MG PO TABS
5.0000 mg | ORAL_TABLET | Freq: Every day | ORAL | Status: DC
  Administered 2021-02-06 – 2021-02-10 (×5): 5 mg via ORAL
  Filled 2021-02-05 (×5): qty 1

## 2021-02-05 MED ORDER — VITAMIN D 25 MCG (1000 UNIT) PO TABS
2000.0000 [IU] | ORAL_TABLET | Freq: Every evening | ORAL | Status: DC
  Administered 2021-02-06 – 2021-02-10 (×6): 2000 [IU] via ORAL
  Filled 2021-02-05 (×6): qty 2

## 2021-02-05 MED ORDER — ONDANSETRON HCL 4 MG PO TABS
4.0000 mg | ORAL_TABLET | Freq: Three times a day (TID) | ORAL | Status: DC | PRN
  Administered 2021-02-06: 4 mg via ORAL
  Filled 2021-02-05: qty 1

## 2021-02-05 MED ORDER — ALBUTEROL SULFATE (2.5 MG/3ML) 0.083% IN NEBU
2.5000 mg | INHALATION_SOLUTION | Freq: Four times a day (QID) | RESPIRATORY_TRACT | Status: DC
  Administered 2021-02-05 – 2021-02-08 (×10): 2.5 mg via RESPIRATORY_TRACT
  Filled 2021-02-05 (×10): qty 3

## 2021-02-05 MED ORDER — UMECLIDINIUM BROMIDE 62.5 MCG/INH IN AEPB
1.0000 | INHALATION_SPRAY | Freq: Every day | RESPIRATORY_TRACT | Status: DC
  Administered 2021-02-06 – 2021-02-10 (×5): 1 via RESPIRATORY_TRACT
  Filled 2021-02-05: qty 7

## 2021-02-05 MED ORDER — CLONAZEPAM 0.5 MG PO TABS: 0.5000 mg | ORAL_TABLET | Freq: Two times a day (BID) | ORAL | Status: DC | PRN

## 2021-02-05 MED ORDER — CALCITRIOL 0.25 MCG PO CAPS
0.2500 ug | ORAL_CAPSULE | Freq: Every day | ORAL | Status: DC
  Administered 2021-02-06 – 2021-02-10 (×5): 0.25 ug via ORAL
  Filled 2021-02-05 (×6): qty 1

## 2021-02-05 MED ORDER — MONTELUKAST SODIUM 10 MG PO TABS
10.0000 mg | ORAL_TABLET | Freq: Every day | ORAL | Status: DC
  Administered 2021-02-06 – 2021-02-09 (×5): 10 mg via ORAL
  Filled 2021-02-05 (×5): qty 1

## 2021-02-05 MED ORDER — OXYCODONE-ACETAMINOPHEN 5-325 MG PO TABS
1.0000 | ORAL_TABLET | Freq: Four times a day (QID) | ORAL | Status: DC | PRN
Start: 2021-02-05 — End: 2021-02-11

## 2021-02-05 MED ORDER — ASPIRIN EC 81 MG PO TBEC
81.0000 mg | DELAYED_RELEASE_TABLET | Freq: Every morning | ORAL | Status: DC
  Administered 2021-02-06 – 2021-02-10 (×5): 81 mg via ORAL
  Filled 2021-02-05 (×4): qty 1

## 2021-02-05 MED ORDER — TRIAMCINOLONE ACETONIDE 0.1 % EX CREA: 1.0000 "application " | TOPICAL_CREAM | Freq: Every day | CUTANEOUS | Status: DC | PRN

## 2021-02-05 MED ORDER — PANTOPRAZOLE SODIUM 40 MG PO TBEC
40.0000 mg | DELAYED_RELEASE_TABLET | Freq: Every day | ORAL | Status: DC
  Administered 2021-02-06 – 2021-02-10 (×5): 40 mg via ORAL
  Filled 2021-02-05 (×5): qty 1

## 2021-02-05 MED ORDER — FUROSEMIDE 80 MG PO TABS
80.0000 mg | ORAL_TABLET | Freq: Two times a day (BID) | ORAL | Status: DC
  Administered 2021-02-06 – 2021-02-10 (×10): 80 mg via ORAL
  Filled 2021-02-05 (×9): qty 1

## 2021-02-05 MED ORDER — INSULIN ASPART 100 UNIT/ML IJ SOLN
0.0000 [IU] | Freq: Three times a day (TID) | INTRAMUSCULAR | Status: DC
  Administered 2021-02-06 – 2021-02-07 (×5): 2 [IU] via SUBCUTANEOUS
  Administered 2021-02-08: 3 [IU] via SUBCUTANEOUS
  Administered 2021-02-08: 2 [IU] via SUBCUTANEOUS
  Administered 2021-02-08: 1 [IU] via SUBCUTANEOUS
  Administered 2021-02-09 (×3): 3 [IU] via SUBCUTANEOUS
  Administered 2021-02-10: 5 [IU] via SUBCUTANEOUS
  Administered 2021-02-10: 2 [IU] via SUBCUTANEOUS
  Administered 2021-02-10: 1 [IU] via SUBCUTANEOUS

## 2021-02-05 MED ORDER — HEPARIN SODIUM (PORCINE) 5000 UNIT/ML IJ SOLN
5000.0000 [IU] | Freq: Two times a day (BID) | INTRAMUSCULAR | Status: DC
  Administered 2021-02-06 – 2021-02-09 (×8): 5000 [IU] via SUBCUTANEOUS
  Filled 2021-02-05 (×8): qty 1

## 2021-02-05 MED ORDER — DOCUSATE SODIUM 100 MG PO CAPS: 200.0000 mg | ORAL_CAPSULE | Freq: Every day | ORAL | Status: DC

## 2021-02-05 MED ORDER — AMLODIPINE BESYLATE 5 MG PO TABS
5.0000 mg | ORAL_TABLET | Freq: Two times a day (BID) | ORAL | Status: DC
  Administered 2021-02-06 – 2021-02-10 (×10): 5 mg via ORAL
  Filled 2021-02-05 (×10): qty 1

## 2021-02-05 MED ORDER — GEMFIBROZIL 600 MG PO TABS
600.0000 mg | ORAL_TABLET | Freq: Two times a day (BID) | ORAL | Status: DC
  Administered 2021-02-06 – 2021-02-10 (×9): 600 mg via ORAL
  Filled 2021-02-05 (×11): qty 1

## 2021-02-05 MED ORDER — FERROUS SULFATE 325 (65 FE) MG PO TABS
325.0000 mg | ORAL_TABLET | Freq: Every day | ORAL | Status: DC
  Administered 2021-02-06 – 2021-02-10 (×6): 325 mg via ORAL
  Filled 2021-02-05 (×6): qty 1

## 2021-02-05 MED ORDER — FLUTICASONE FUROATE-VILANTEROL 100-25 MCG/INH IN AEPB
1.0000 | INHALATION_SPRAY | Freq: Every day | RESPIRATORY_TRACT | Status: DC
  Administered 2021-02-06 – 2021-02-10 (×5): 1 via RESPIRATORY_TRACT
  Filled 2021-02-05: qty 28

## 2021-02-05 MED ORDER — ACETAMINOPHEN 500 MG PO TABS: 1000.0000 mg | ORAL_TABLET | Freq: Four times a day (QID) | ORAL | Status: DC | PRN

## 2021-02-05 MED ORDER — FLUTICASONE-UMECLIDIN-VILANT 100-62.5-25 MCG/INH IN AEPB: 1.0000 | INHALATION_SPRAY | Freq: Every day | RESPIRATORY_TRACT | Status: DC

## 2021-02-05 MED ORDER — SEVELAMER CARBONATE 800 MG PO TABS
800.0000 mg | ORAL_TABLET | Freq: Three times a day (TID) | ORAL | Status: DC
  Administered 2021-02-06 – 2021-02-10 (×14): 800 mg via ORAL
  Filled 2021-02-05 (×14): qty 1

## 2021-02-05 MED ORDER — ATORVASTATIN CALCIUM 40 MG PO TABS: 40.0000 mg | ORAL_TABLET | Freq: Every day | ORAL | Status: DC

## 2021-02-05 MED ORDER — GABAPENTIN 300 MG PO CAPS
300.0000 mg | ORAL_CAPSULE | Freq: Two times a day (BID) | ORAL | Status: DC
  Administered 2021-02-06 – 2021-02-07 (×4): 300 mg via ORAL
  Filled 2021-02-05 (×4): qty 1

## 2021-02-05 MED ORDER — VITAMIN A 3 MG (10000 UNIT) PO CAPS
10000.0000 [IU] | ORAL_CAPSULE | Freq: Every evening | ORAL | Status: DC
  Administered 2021-02-06 – 2021-02-10 (×6): 10000 [IU] via ORAL
  Filled 2021-02-05 (×6): qty 1

## 2021-02-05 MED ORDER — ATORVASTATIN CALCIUM 40 MG PO TABS
40.0000 mg | ORAL_TABLET | Freq: Every day | ORAL | Status: DC
  Administered 2021-02-06 – 2021-02-09 (×5): 40 mg via ORAL
  Filled 2021-02-05 (×5): qty 1

## 2021-02-05 MED ORDER — SODIUM BICARBONATE 650 MG PO TABS
650.0000 mg | ORAL_TABLET | Freq: Two times a day (BID) | ORAL | Status: DC
  Administered 2021-02-06 – 2021-02-10 (×10): 650 mg via ORAL
  Filled 2021-02-05 (×11): qty 1

## 2021-02-05 NOTE — Progress Notes (Signed)
NEW ADMISSION NOTE New Admission Note:   Arrival Method: stretcher Mental Orientation:A&O X4 Telemetry: 5M20 Assessment: Completed Skin: intact. Scratch on the upper mid abdomen, bruising Right flank, Right arm, Right anterior foot. IV: Left wrist, PICC right chest Pain: 0/10 Tubes:none Safety Measures: Safety Fall Prevention Plan has been given, discussed and signed Admission: Completed [facility] [place] Orientation: Patient has been orientated to the room, unit and staff.  Family: husband at bedside  Orders have been reviewed and implemented. Will continue to monitor the patient. Call light has been placed within reach and bed alarm has been activated.   Maram Bently S Peniel Hass, RN

## 2021-02-05 NOTE — H&P (Signed)
History and Physical    Cindy Park ATF:[id number] DOB: 1956/08/01 DOA: 02/05/2021  PCP: Monico Blitz, MD (Confirm with patient/family/NH records and if not entered, this has to be entered at [facility] point of entry) Patient coming from: Home  I have personally briefly reviewed patient's old medical records in [place]  Chief Complaint: Feeling ok  HPI: Cindy Park is a 65 y.o. female with medical history significant of CKD stage V, IDDM, asthma/COPD, HTN, HLD, hypothyroidism, presented with syncope with hypoglycemia.  Patient has had worsening of her kidney function since last year when she was CKD stage IV and since this month, her CKD has advanced to stage V.  Patient has been following with Dr. Theador Hawthorne with [facility] kidney at [facility].  Her nephrology has set up a right-sided tunneled IJ as well as left arm AV fistula to get her ready for HD however due to lifestyle issue, patient has continued to decline the offer to start HD.  Last year around December, she was diagnosed with a small pericardial effusion incidentally, she did not have any symptoms of shortness of breath or chest pains.  Lately, patient started to experience episode of hypoglycemia with palpitations sweating.  Last night, patient did not eat dinner, and she forgot to cut down her insulin accordingly and then she blacked out and was shifted to ED at [facility].  While at ED, her initial sugar was 32, and her glucose was stabilized but x-ray found she had cardiomegaly, echocardiogram showed moderate size of pericardial effusion but no tamponade.  [facility] was informed and recommend patient shifted to [facility] for discussion about starting dialysis to treat moderate pericardial effusion.    Review of Systems: As per HPI otherwise 14 point review of systems negative.    Past Medical History:  Diagnosis Date   Anemia    Arthritis    Asthma    Cataract    Combined form OU   COPD  (chronic obstructive pulmonary disease) ([place])    Diabetes mellitus without complication (HCC)    Diabetic retinopathy ([place])    BDR OU   Family history of adverse reaction to anesthesia    sister- difficult to wake up from anesthesia   Fluid excess    GERD (gastroesophageal reflux disease)    Goiter    Hypertension    Hypertensive retinopathy    OU   Renal failure     Past Surgical History:  Procedure Laterality Date   ABDOMINAL HYSTERECTOMY     AV FISTULA PLACEMENT Left 06/11/2020   Procedure: LEFT ARM ARTERIOVENOUS (AV) FISTULA CREATION;  Surgeon: [name], MD;  Location: [facility];  Service: Vascular;  Laterality: Left;   BASCILIC VEIN TRANSPOSITION Left 08/06/2020   Procedure: LEFT ARM SECOND STAGE [place];  Surgeon: [name], MD;  Location: [facility];  Service: Vascular;  Laterality: Left;   BIOPSY  12/02/2020   Procedure: BIOPSY;  Surgeon: Daneil Dolin, MD;  [facility];  Service: Endoscopy;;  gastric   CHOLECYSTECTOMY     COLONOSCOPY N/A 03/25/2020   Rourk: two tubular adenomas removed. next colonoscopy five years   ESOPHAGOGASTRODUODENOSCOPY N/A 12/02/2020   Procedure: ESOPHAGOGASTRODUODENOSCOPY (EGD);  Surgeon: Daneil Dolin, MD;  [facility];  Service: Endoscopy;  Laterality: N/A;  am appt   POLYPECTOMY  03/25/2020   Procedure: POLYPECTOMY;  Surgeon: Daneil Dolin, MD;  [facility];  Service: Endoscopy;;   POLYPECTOMY  12/02/2020   Procedure: POLYPECTOMY;  Surgeon: Daneil Dolin, MD;  [facility];  Service: Endoscopy;;  gastric     reports that she has never smoked. She has never used smokeless tobacco. She reports that she does not drink alcohol and does not use drugs.  Allergies  Allergen Reactions   Latex Shortness Of Breath and Rash    With powder    Niacin Other (See Comments)    Burning   Other Dermatitis    Powder in the gloves    Tomato Other (See Comments)    Breaks pt out     Bactrim [Sulfamethoxazole-Trimethoprim] Swelling and Rash    Family History  Problem Relation Age of Onset   Kidney disease Mother        ESRD   Kidney disease Sister        ESRD   Colon polyps Brother 21   Kidney disease Maternal Grandmother        ESRD   Colon cancer Neg Hx      Prior to Admission medications   Medication Sig Start Date End Date Taking? Authorizing Provider  acetaminophen (TYLENOL) 500 MG tablet Take 1,000 mg by mouth every 6 (six) hours as needed for moderate pain.     [provider]  albuterol (PROVENTIL) (2.5 MG/3ML) 0.083% nebulizer solution Take 2.5 mg by nebulization 4 (four) times daily.     [provider]  albuterol (VENTOLIN HFA) 108 (90 Base) MCG/ACT inhaler Inhale 2 puffs into the lungs every 6 (six) hours as needed for wheezing or shortness of breath.    [provider]  amLODipine (NORVASC) 5 MG tablet Take 5 mg by mouth 2 (two) times daily.    [provider]  aspirin EC 81 MG tablet Take 81 mg by mouth in the morning.    [provider]  atorvastatin (LIPITOR) 40 MG tablet Take 40 mg by mouth at bedtime.     [provider]  calcitRIOL (ROCALTROL) 0.25 MCG capsule Take 0.25 mcg by mouth in the morning.    [provider]  carvedilol (COREG) 25 MG tablet Take 25 mg by mouth 2 (two) times daily with a meal.  07/01/20   [provider]  Cholecalciferol (VITAMIN D3) 50 MCG (2000 UT) TABS Take 2,000 Units by mouth every evening.    [provider]  clonazePAM (KLONOPIN) 0.5 MG tablet Take 0.5 mg by mouth 2 (two) times daily as needed for anxiety.    [provider]  dicyclomine (BENTYL) 10 MG capsule Take 10 mg by mouth 2 (two) times daily.     [provider]  docusate sodium (COLACE) 100 MG capsule Take 200 mg by mouth daily.    [provider]  epoetin alfa (EPOGEN) 3000 UNIT/ML injection Inject 3,000 Units into the skin every 14 (fourteen) days.      Bhutani, Manpreet S, MD  epoetin alfa-epbx (RETACRIT) 15176 UNIT/ML injection 6,000 Units every 14 (fourteen) days. 11/27/20   Bhutani, Lavina Hamman, MD  ferrous sulfate 325 (65 FE) MG tablet Take 325 mg by mouth 2 (two) times daily with a meal.    [provider]  Fluticasone-Umeclidin-Vilant (TRELEGY ELLIPTA) 100-62.5-25 MCG/INH AEPB Inhale 1 puff into the lungs daily.    [provider]  furosemide (LASIX) 80 MG tablet Take 80 mg by mouth 2 (two) times daily.    [provider]  gabapentin (NEURONTIN) 300 MG capsule Take 300 mg by mouth 2 (two) times  daily.    [provider]  gemfibrozil (LOPID) 600 MG tablet Take 600 mg by mouth in the morning and at bedtime.    [provider]  Insulin Lispro Prot & Lispro (HUMALOG MIX 75/25 [place]) Inject 15-60 Units into the skin See admin instructions. Inject 60 units into the skin in the morning and 15 units at lunch and 60 units at supper    [provider]  levothyroxine (SYNTHROID) 50 MCG tablet Take 50 mcg by mouth daily before breakfast.  03/05/20   [provider]  montelukast (SINGULAIR) 10 MG tablet Take 10 mg by mouth at bedtime.    [provider]  omeprazole (PRILOSEC) 20 MG capsule Take 20 mg by mouth every evening.    [provider]  ondansetron (ZOFRAN) 4 MG tablet Take 4 mg by mouth every 8 (eight) hours as needed for nausea or vomiting. 09/28/20   [provider]  oxyCODONE-acetaminophen (PERCOCET) 5-325 MG tablet Take 1 tablet by mouth every 6 (six) hours as needed for severe pain. 08/26/20 08/26/21  Dagoberto Ligas, PA-C  sevelamer carbonate (RENVELA) 800 MG tablet Take by mouth. 11/20/20   [provider]  sitaGLIPtin (JANUVIA) 25 MG tablet Take 25 mg by mouth in the morning.    [provider]  sodium bicarbonate 650 MG tablet Take 650 mg by mouth 2 (two) times daily.    [provider]  triamcinolone (KENALOG) 0.1 % Apply 1  application topically daily as needed (skin irritation). To feet 07/07/20   [provider]  Vitamin A 2400 MCG (8000 UT) TABS Take 2,400 mcg by mouth every evening.    [provider]  vitamin B-12 (CYANOCOBALAMIN) 500 MCG tablet Take 500 mcg by mouth in the morning.    [provider]  vitamin E 180 MG (400 UNITS) capsule Take 400 Units by mouth daily.    [provider]    Physical Exam: Vitals:   02/05/21 1700  BP: (!) 174/72  Pulse: 87  Resp: 18  Temp: 98.3 F (36.8 C)  TempSrc: Oral  SpO2: 98%  Weight: 95.3 kg  Height: 5\' 7"  (1.702 m)    Constitutional: NAD, calm, comfortable Vitals:   02/05/21 1700  BP: (!) 174/72  Pulse: 87  Resp: 18  Temp: 98.3 F (36.8 C)  TempSrc: Oral  SpO2: 98%  Weight: 95.3 kg  Height: 5\' 7"  (1.702 m)   Eyes: PERRL, lids and conjunctivae normal ENMT: Mucous membranes are moist. Posterior pharynx clear of any exudate or lesions.Normal dentition.  Neck: normal, supple, no masses, no thyromegaly Respiratory: clear to auscultation bilaterally, no wheezing, no crackles. Normal respiratory effort. No accessory muscle use.  Cardiovascular: Regular rate and rhythm, no murmurs / rubs / gallops. No extremity edema. 2+ pedal pulses. No carotid bruits.  Abdomen: no tenderness, no masses palpated. No hepatosplenomegaly. Bowel sounds positive.  Musculoskeletal: no clubbing / cyanosis. No joint deformity upper and lower extremities. Good ROM, no contractures. Normal muscle tone.  Skin: no rashes, lesions, ulcers. No induration Neurologic: CN 2-12 grossly intact. Sensation intact, DTR normal. Strength 5/5 in all 4.  Psychiatric: Normal judgment and insight. Alert and oriented x 3. Normal mood.     Labs on Admission: I have personally reviewed following labs and imaging studies  CBC: No results for input(s): WBC, NEUTROABS, HGB, HCT, MCV, PLT in the last 168 hours. Basic Metabolic Panel: No results for input(s): NA,  K, CL, CO2, GLUCOSE, BUN, CREATININE, CALCIUM, MG, PHOS in  the last 168 hours. GFR: CrCl cannot be calculated (Patient's most recent lab result is older than the maximum 21 days allowed.). Liver Function Tests: No results for input(s): AST, ALT, ALKPHOS, BILITOT, PROT, ALBUMIN in the last 168 hours. No results for input(s): LIPASE, AMYLASE in the last 168 hours. No results for input(s): AMMONIA in the last 168 hours. Coagulation Profile: No results for input(s): INR, PROTIME in the last 168 hours. Cardiac Enzymes: No results for input(s): CKTOTAL, CKMB, CKMBINDEX, TROPONINI in the last 168 hours. BNP (last 3 results) No results for input(s): PROBNP in the last 8760 hours. HbA1C: No results for input(s): HGBA1C in the last 72 hours. CBG: No results for input(s): GLUCAP in the last 168 hours. Lipid Profile: No results for input(s): CHOL, HDL, LDLCALC, TRIG, CHOLHDL, LDLDIRECT in the last 72 hours. Thyroid Function Tests: No results for input(s): TSH, T4TOTAL, FREET4, T3FREE, THYROIDAB in the last 72 hours. Anemia Panel: No results for input(s): VITAMINB12, FOLATE, FERRITIN, TIBC, IRON, RETICCTPCT in the last 72 hours. Urine analysis: No results found for: COLORURINE, APPEARANCEUR, LABSPEC, PHURINE, GLUCOSEU, HGBUR, BILIRUBINUR, KETONESUR, PROTEINUR, UROBILINOGEN, NITRITE, LEUKOCYTESUR  Radiological Exams on Admission: No results found.  EKG: Ordered  Assessment/Plan Active Problems:   Hypoglycemia due to insulin   Hypoglycemia  (please populate well all problems here in Problem List. (For example, if patient is on BP meds at home and you resume or decide to hold them, it is a problem that needs to be her. Same for CAD, COPD, HLD and so on)   Hypoglycemia -Resolved, likely from over-stringent insulin regimen. -Stopped her home insulin regimen, start sliding scale with renal adjustment. -A1c pending.  Pericardial effusion moderate -Likely secondary to chronic uremia/renal  source -LVEF normal, no signs of tamponade on echo done yesterday.  Rather asymptomatic however given her overall worsening of kidney function over the last month or so, it is probably better for patient to start HD ASAP.  Discussed with on-call nephrology Dr. Hollie Salk who will evaluate patient tonight.  HTN -Stable, continue home BP regimen.  Asthma/COPD -Stable  HLD -Continue statin  Hypothyroidism -Continue Synthroid  DVT prophylaxis: Heparin subcu Code Status: Full code Family Communication: Husband at bedside Disposition Plan: Expect more than 2 midnight hospital stay for series of HD if indicated Consults called: Nephrology Admission status: Telemetry admission   Lequita Halt MD [facility] Pager [phone]  02/05/2021, 7:06 PM

## 2021-02-06 ENCOUNTER — Inpatient Hospital Stay (HOSPITAL_COMMUNITY): Payer: Medicare Other

## 2021-02-06 DIAGNOSIS — T383X5A Adverse effect of insulin and oral hypoglycemic [antidiabetic] drugs, initial encounter: Secondary | ICD-10-CM

## 2021-02-06 DIAGNOSIS — I313 Pericardial effusion (noninflammatory): Secondary | ICD-10-CM

## 2021-02-06 DIAGNOSIS — E160 Drug-induced hypoglycemia without coma: Secondary | ICD-10-CM

## 2021-02-06 LAB — BASIC METABOLIC PANEL
Anion gap: 8 (ref 5–15)
BUN: 54 mg/dL — ABNORMAL HIGH (ref 8–23)
CO2: 22 mmol/L (ref 22–32)
Calcium: 8.8 mg/dL — ABNORMAL LOW (ref 8.9–10.3)
Chloride: 109 mmol/L (ref 98–111)
Creatinine, Ser: 5.36 mg/dL — ABNORMAL HIGH (ref 0.44–1.00)
GFR, Estimated: 8 mL/min — ABNORMAL LOW (ref >60)
Glucose, Bld: 192 mg/dL — ABNORMAL HIGH (ref 70–99)
Potassium: 4.2 mmol/L (ref 3.5–5.1)
Sodium: 139 mmol/L (ref 135–145)

## 2021-02-06 LAB — CBC
HCT: 23.1 % — ABNORMAL LOW (ref 36.0–46.0)
Hemoglobin: 7.2 g/dL — ABNORMAL LOW (ref 12.0–15.0)
MCH: 27.8 pg (ref 26.0–34.0)
MCHC: 31.2 g/dL (ref 30.0–36.0)
MCV: 89.2 fL (ref 80.0–100.0)
Platelets: 165 10*3/uL (ref 150–400)
RBC: 2.59 MIL/uL — ABNORMAL LOW (ref 3.87–5.11)
RDW: 13.8 % (ref 11.5–15.5)
WBC: 6.5 10*3/uL (ref 4.0–10.5)
nRBC: 0 % (ref 0.0–0.2)

## 2021-02-06 LAB — GLUCOSE, CAPILLARY
Glucose-Capillary: 134 mg/dL — ABNORMAL HIGH (ref 70–99)
Glucose-Capillary: 155 mg/dL — ABNORMAL HIGH (ref 70–99)
Glucose-Capillary: 156 mg/dL — ABNORMAL HIGH (ref 70–99)
Glucose-Capillary: 195 mg/dL — ABNORMAL HIGH (ref 70–99)
Glucose-Capillary: 236 mg/dL — ABNORMAL HIGH (ref 70–99)

## 2021-02-06 LAB — HEPATITIS B SURFACE ANTIGEN: Hepatitis B Surface Ag: NONREACTIVE

## 2021-02-06 LAB — HIV ANTIBODY (ROUTINE TESTING W REFLEX): HIV Screen 4th Generation wRfx: NONREACTIVE

## 2021-02-06 LAB — HEPATITIS B CORE ANTIBODY, TOTAL: Hep B Core Total Ab: NONREACTIVE

## 2021-02-06 MED ORDER — SODIUM CHLORIDE 0.9% FLUSH
10.0000 mL | Freq: Two times a day (BID) | INTRAVENOUS | Status: DC
Start: 2021-02-06 — End: 2021-02-11
  Administered 2021-02-06 – 2021-02-09 (×7): 10 mL

## 2021-02-06 MED ORDER — CHLORHEXIDINE GLUCONATE CLOTH 2 % EX PADS: 6.0000 | MEDICATED_PAD | Freq: Every day | CUTANEOUS | Status: DC

## 2021-02-06 MED ORDER — SODIUM CHLORIDE 0.9 % IV SOLN: 100.0000 mL | INTRAVENOUS | Status: DC | PRN

## 2021-02-06 MED ORDER — PENTAFLUOROPROP-TETRAFLUOROETH EX AERO: 1.0000 "application " | INHALATION_SPRAY | CUTANEOUS | Status: DC | PRN

## 2021-02-06 MED ORDER — SODIUM CHLORIDE 0.9% FLUSH: 10.0000 mL | INTRAVENOUS | Status: DC | PRN

## 2021-02-06 MED ORDER — CHLORHEXIDINE GLUCONATE CLOTH 2 % EX PADS
6.0000 | MEDICATED_PAD | Freq: Every day | CUTANEOUS | Status: DC
  Administered 2021-02-06 – 2021-02-09 (×4): 6 via TOPICAL

## 2021-02-06 MED ORDER — LIDOCAINE-PRILOCAINE 2.5-2.5 % EX CREA: 1.0000 "application " | TOPICAL_CREAM | CUTANEOUS | Status: DC | PRN

## 2021-02-06 MED ORDER — LIDOCAINE HCL (PF) 1 % IJ SOLN: 5.0000 mL | INTRAMUSCULAR | Status: DC | PRN

## 2021-02-06 NOTE — Consult Note (Signed)
Reason for Consult: To manage dialysis and dialysis related needs Referring Physician: Dr Arlee Muslim Cindy Park is an 65 y.o. female.  HPI: Pt is a 72F with a PMH sig for CKD V, HTN, HLD, DM II who is transferred from [facility] for initiation of dialysis.  Had hypoglycemic episode which brought her to ED.  Was found to have worsening Cr.  TTE with pericardial effusion, increased from prior.    In this setting we are asked to see.  She is followed by Dr Theador Hawthorne.  She is interested in PD.  No f/c, n/v, SOB, CP, LE edema.  Has had more SOB lately.  No jerking/ itching/ anorexia/ dysgeusia.  Past Medical History:  Diagnosis Date   Anemia    Arthritis    Asthma    Cataract    Combined form OU   COPD (chronic obstructive pulmonary disease) ([place])    Diabetes mellitus without complication (HCC)    Diabetic retinopathy ([place])    BDR OU   Family history of adverse reaction to anesthesia    sister- difficult to wake up from anesthesia   Fluid excess    GERD (gastroesophageal reflux disease)    Goiter    Hypertension    Hypertensive retinopathy    OU   Renal failure     Past Surgical History:  Procedure Laterality Date   ABDOMINAL HYSTERECTOMY     AV FISTULA PLACEMENT Left 06/11/2020   Procedure: LEFT ARM ARTERIOVENOUS (AV) FISTULA CREATION;  Surgeon: [name], MD;  Location: [facility] ORS;  Service: Vascular;  Laterality: Left;   BASCILIC VEIN TRANSPOSITION Left 08/06/2020   Procedure: LEFT ARM SECOND STAGE [place];  Surgeon: [name], MD;  Location: [facility];  Service: Vascular;  Laterality: Left;   BIOPSY  12/02/2020   Procedure: BIOPSY;  Surgeon: Daneil Dolin, MD;  Location: [facility] ENDO SUITE;  Service: Endoscopy;;  gastric   CHOLECYSTECTOMY     COLONOSCOPY N/A 03/25/2020   [place]: two tubular adenomas removed. next colonoscopy five years   ESOPHAGOGASTRODUODENOSCOPY N/A 12/02/2020   Procedure: ESOPHAGOGASTRODUODENOSCOPY (EGD);  Surgeon: Daneil Dolin, MD;  Location: [facility]  ENDO SUITE;  Service: Endoscopy;  Laterality: N/A;  am appt   POLYPECTOMY  03/25/2020   Procedure: POLYPECTOMY;  Surgeon: Daneil Dolin, MD;  Location: [facility] END[facility];  Service: Endoscopy;;   POLYPECTOMY  12/02/2020   Procedure: POLYPECTOMY;  Surgeon: Daneil Dolin, MD;  Location: [facility];  Service: Endoscopy;;  gastric    Family History  Problem Relation Age of Onset   Kidney disease Mother        ESRD   Kidney disease Sister        ESRD   Colon polyps Brother 42   Kidney disease Maternal Grandmother        ESRD   Colon cancer Neg Hx     Social History:  reports that she has never smoked. She has never used smokeless tobacco. She reports that she does not drink alcohol and does not use drugs.  Allergies:  Allergies  Allergen Reactions   Latex Shortness Of Breath and Rash    With powder    Niacin Other (See Comments)    Burning   Other Dermatitis    Powder in the gloves    Tomato Other (See Comments)    Breaks pt out    Bactrim [Sulfamethoxazole-Trimethoprim] Swelling and Rash    Medications: Scheduled:  albuterol  2.5 mg Nebulization QID  amLODipine  5 mg Oral BID   aspirin EC  81 mg Oral q AM   atorvastatin  40 mg Oral QHS   calcitRIOL  0.25 mcg Oral Q breakfast   carvedilol  12.5 mg Oral BID WC   Chlorhexidine Gluconate Cloth  6 each Topical Q0600   cholecalciferol  2,000 Units Oral QPM   dicyclomine  10 mg Oral BID   ferrous sulfate  325 mg Oral Daily   fluticasone furoate-vilanterol  1 puff Inhalation Daily   And   umeclidinium bromide  1 puff Inhalation Daily   furosemide  80 mg Oral BID   gabapentin  300 mg Oral BID   gemfibrozil  600 mg Oral BID AC   heparin  5,000 Units Subcutaneous Q12H   insulin aspart  0-9 Units Subcutaneous TID WC   levothyroxine  50 mcg Oral Q0600   linagliptin  5 mg Oral Daily   montelukast  10 mg Oral QHS   pantoprazole  40 mg Oral Daily   sevelamer carbonate  800 mg Oral TID WC   sodium bicarbonate  650 mg Oral BID    sodium chloride flush  10-40 mL Intracatheter Q12H   vitamin A  10,000 Units Oral QPM   vitamin B-12  2,500 mcg Oral Daily     Results for orders placed or performed during the hospital encounter of 02/05/21 (from the past 48 hour(s))  SARS CORONAVIRUS 2 (TAT 6-24 HRS) Nasopharyngeal Nasopharyngeal Swab     Status: None   Collection Time: 02/05/21  7:18 PM   Specimen: Nasopharyngeal Swab  Result Value Ref Range   SARS Coronavirus 2 NEGATIVE NEGATIVE    Comment: (NOTE) SARS-CoV-2 target nucleic acids are NOT DETECTED.  The SARS-CoV-2 RNA is generally detectable in upper and lower respiratory specimens during the acute phase of infection. Negative results do not preclude SARS-CoV-2 infection, do not rule out co-infections with other pathogens, and should not be used as the sole basis for treatment or other patient management decisions. Negative results must be combined with clinical observations, patient history, and epidemiological information. The expected result is Negative.  Fact Sheet for Patients: SugarRoll.be  Fact Sheet for Healthcare Providers: https://www.woods-mathews.com/  This test is not yet approved or cleared by the [place] FDA and  has been authorized for detection and/or diagnosis of SARS-CoV-2 by FDA under an Emergency Use Authorization (EUA). This EUA will remain  in effect (meaning this test can be used) for the duration of the COVID-19 declaration under Se ction 564(b)(1) of the Act, 21 U.S.C. section 360bbb-3(b)(1), unless the authorization is terminated or revoked sooner.  Performed at [facility], [place] [address], [place], [place] [address]   Glucose, capillary     Status: Abnormal   Collection Time: 02/06/21 12:00 AM  Result Value Ref Range   Glucose-Capillary 236 (H) 70 - 99 mg/dL    Comment: Glucose reference range applies only to samples taken after fasting for at least 8 hours.  CBC      Status: Abnormal   Collection Time: 02/06/21  3:30 AM  Result Value Ref Range   WBC 6.5 4.0 - 10.5 K/uL   RBC 2.59 (L) 3.87 - 5.11 MIL/uL   Hemoglobin 7.2 (L) 12.0 - 15.0 g/dL   HCT 23.1 (L) 36.0 - 46.0 %   MCV 89.2 80.0 - 100.0 fL   MCH 27.8 26.0 - 34.0 pg   MCHC 31.2 30.0 - 36.0 g/dL   RDW 13.8 11.5 - 15.5 %  Platelets 165 150 - 400 K/uL   nRBC 0.0 0.0 - 0.2 %    Comment: Performed at [facility], [place] [address], [place], [place] [address]  Basic metabolic panel     Status: Abnormal   Collection Time: 02/06/21  3:30 AM  Result Value Ref Range   Sodium 139 135 - 145 mmol/L   Potassium 4.2 3.5 - 5.1 mmol/L   Chloride 109 98 - 111 mmol/L   CO2 22 22 - 32 mmol/L   Glucose, Bld 192 (H) 70 - 99 mg/dL    Comment: Glucose reference range applies only to samples taken after fasting for at least 8 hours.   BUN 54 (H) 8 - 23 mg/dL   Creatinine, Ser 5.36 (H) 0.44 - 1.00 mg/dL   Calcium 8.8 (L) 8.9 - 10.3 mg/dL   GFR, Estimated 8 (L) >60 mL/min    Comment: (NOTE) Calculated using the CKD-EPI Creatinine Equation (2021)    Anion gap 8 5 - 15    Comment: Performed at [facility] [address] [place], [place] [address]  Glucose, capillary     Status: Abnormal   Collection Time: 02/06/21  7:29 AM  Result Value Ref Range   Glucose-Capillary 155 (H) 70 - 99 mg/dL    Comment: Glucose reference range applies only to samples taken after fasting for at least 8 hours.    DG CHEST PORT 1 VIEW  Result Date: 02/06/2021 CLINICAL DATA:  Central line. EXAM: PORTABLE CHEST 1 VIEW COMPARISON:  Chest radiograph 02/03/2021. FINDINGS: Central venous catheter tip projects over the superior vena cava. Monitoring leads overlie the patient. Stable cardiomegaly. Heterogeneous opacities lung bases may represent atelectasis. No pleural effusion or pneumothorax. Osseous structures unremarkable. IMPRESSION: Central venous catheter tip projects over the superior vena cava. Cardiomegaly. Atelectasis  versus scarring. Electronically Signed   By: Lovey Newcomer M.D.   On: 02/06/2021 09:03    ROS: all other systems reviewed and are negative except as per HPI  Blood pressure (!) 159/69, pulse 72, temperature 98.1 F (36.7 C), temperature source Oral, resp. rate 18, height 5\' 7"  (1.702 m), weight 95.3 kg, SpO2 95 %. GEN NAD, sitting I bed HEENT EOMI PERRL NECK R IJ powerpicc (not TDC for dialysis) PULM clear CV RRR, no rub ABD soft EXT trace LE edema NEURO AAO x 3 no asterixis SKIN no rashes/ lesions ACCESS: L AVF + T/B, appears ready for use   Assessment/Plan: 1 Hypoglycemia: per pt, she didn't eat enough.  Checking sugars here, have been adequate.  Per primary 2 CKDV--> ESRD: new start.  HD #1 02/06/21.  Has AVF which we will try to use.  Followed by Dr Theador Hawthorne in [place] and wants to continue following with him upon discharge.  Also wants to do PD eventually. 3 Hypertension: reasonably well controlled 4. Anemia of ESRD: will give ESA in dialysis 5. Metabolic Bone Disease:  will check PTH 6.  Nutrition: renal diet 7.  Dispo: can go once CLIP'd  Keari Miu 02/06/2021, 10:20 AM

## 2021-02-06 NOTE — Progress Notes (Signed)
PROGRESS NOTE  Cindy Park OJJ:[id number] DOB: July 02, 1956 DOA: 02/05/2021 PCP: Monico Blitz, MD  HPI/Recap of past 24 hours: This is a 65 year old female with medical history significant for chronic kidney disease stage IV not  on hemodialysis, insulin-dependent diabetes mellitus, asthma/COPD, hyperlipidemia hypertension hypothyroidism who presented with syncope with hypoglycemia.  Patient stated that she had not taken her insulin and did not eat enough food that time as a she passed out.  She presented to the emergency department they noted that her blood sugar was 32.  She was also noted to have pericardial effusion and cardiomegaly.  After consultation with nephrology ED advised to transfer her to [facility] for hemodialysis for the treatment of moderate pericardial effusion.  Patient seen and examined she was at hemodialysis She denied any new complaints states she felt better.  Assessment/Plan: Active Problems:   Hypoglycemia due to insulin   Hypoglycemia   Pericardial effusion  #1 hypoglycemia IV fluid was given has sugar has improved. Patient counseled regarding medication she adjusts her insulin if she does not eat enough  2.  Chronic kidney disease stage V patient has been started on hemodialysis today this is her first hemodialysis.  3.  Hypertension uncontrolled continue carvedilol and amlodipine  4.  Anemia of chronic disease.  Hemoglobin is 7.2 today patient was given ESA in dialysis  5.  Hyperlipidemia continue statins  6.  Hypothyroidism continue Synthroid  7.  Pericardial effusion, likely from the renal sinus, there was no tamponade Patient is relatively asymptomatic Patient was started on hemodialysis after consultation with the nephrology due to her worsening renal function      Code Status: Full  Severity of Illness: The appropriate patient status for this patient is INPATIENT. Inpatient status is judged to be reasonable and necessary in order to  provide the required intensity of service to ensure the patient's safety. The patient's presenting symptoms, physical exam findings, and initial radiographic and laboratory data in the context of their chronic comorbidities is felt to place them at high risk for further clinical deterioration. Furthermore, it is not anticipated that the patient will be medically stable for discharge from the hospital within 2 midnights of admission. The following factors support the patient status of inpatient.  Pericardial effusion end-stage renal disease requiring hemodialysis  * I certify that at the point of admission it is my clinical judgment that the patient will require inpatient hospital care spanning beyond 2 midnights from the point of admission due to high intensity of service, high risk for further deterioration and high frequency of surveillance required.*   Family Communication: None at bedside  Disposition Plan: Home in 1 to 2 days   Consultants: Nephrology  Procedures: Hemodialysis  Antimicrobials: None  DVT prophylaxis: Subacute heparin   Objective: Vitals:   02/05/21 2300 02/06/21 0600 02/06/21 0819 02/06/21 0925  BP: (!) 156/78 (!) 160/69 (!) 162/76 (!) 159/69  Pulse: 82 68 78 72  Resp: 18 20  18   Temp: 98.6 F (37 C) 98.2 F (36.8 C)  98.1 F (36.7 C)  TempSrc: Oral Oral  Oral  SpO2: 96% 96% 100% 95%  Weight:      Height:        Intake/Output Summary (Last 24 hours) at 02/06/2021 1047 Last data filed at 02/06/2021 0800 Gross per 24 hour  Intake 540 ml  Output 500 ml  Net 40 ml   Filed Weights   02/05/21 1700  Weight: 95.3 kg   Body mass index is  32.91 kg/m.  Exam:  General: 65 y.o. year-old female well developed well nourished in no acute distress.  Alert and oriented x3.  Very pleasant Cardiovascular: Regular rate and rhythm with no rubs or gallops.  No thyromegaly or JVD noted.   Respiratory: Clear to auscultation with no wheezes or rales. Good inspiratory  effort. Abdomen: Soft nontender nondistended with normal bowel sounds x4 quadrants. Musculoskeletal: No lower extremity edema. 2/4 pulses in all 4 extremities. Skin: No ulcerative lesions noted or rashes, Psychiatry: Mood is appropriate for condition and setting    Data Reviewed: CBC: Recent Labs  Lab 02/06/21 0330  WBC 6.5  HGB 7.2*  HCT 23.1*  MCV 89.2  PLT 001   Basic Metabolic Panel: Recent Labs  Lab 02/06/21 0330  NA 139  K 4.2  CL 109  CO2 22  GLUCOSE 192*  BUN 54*  CREATININE 5.36*  CALCIUM 8.8*   GFR: Estimated Creatinine Clearance: 12.7 mL/min (A) (by C-G formula based on SCr of 5.36 mg/dL (H)). Liver Function Tests: No results for input(s): AST, ALT, ALKPHOS, BILITOT, PROT, ALBUMIN in the last 168 hours. No results for input(s): LIPASE, AMYLASE in the last 168 hours. No results for input(s): AMMONIA in the last 168 hours. Coagulation Profile: No results for input(s): INR, PROTIME in the last 168 hours. Cardiac Enzymes: No results for input(s): CKTOTAL, CKMB, CKMBINDEX, TROPONINI in the last 168 hours. BNP (last 3 results) No results for input(s): PROBNP in the last 8760 hours. HbA1C: No results for input(s): HGBA1C in the last 72 hours. CBG: Recent Labs  Lab 02/06/21 0000 02/06/21 0729  GLUCAP 236* 155*   Lipid Profile: No results for input(s): CHOL, HDL, LDLCALC, TRIG, CHOLHDL, LDLDIRECT in the last 72 hours. Thyroid Function Tests: No results for input(s): TSH, T4TOTAL, FREET4, T3FREE, THYROIDAB in the last 72 hours. Anemia Panel: No results for input(s): VITAMINB12, FOLATE, FERRITIN, TIBC, IRON, RETICCTPCT in the last 72 hours. Urine analysis: No results found for: COLORURINE, APPEARANCEUR, LABSPEC, PHURINE, GLUCOSEU, HGBUR, BILIRUBINUR, KETONESUR, PROTEINUR, UROBILINOGEN, NITRITE, LEUKOCYTESUR Sepsis Labs: @LABRCNTIP (procalcitonin:4,lacticidven:4)  ) Recent Results (from the past 240 hour(s))  SARS CORONAVIRUS 2 (TAT 6-24 HRS)  Nasopharyngeal Nasopharyngeal Swab     Status: None   Collection Time: 02/05/21  7:18 PM   Specimen: Nasopharyngeal Swab  Result Value Ref Range Status   SARS Coronavirus 2 NEGATIVE NEGATIVE Final    Comment: (NOTE) SARS-CoV-2 target nucleic acids are NOT DETECTED.  The SARS-CoV-2 RNA is generally detectable in upper and lower respiratory specimens during the acute phase of infection. Negative results do not preclude SARS-CoV-2 infection, do not rule out co-infections with other pathogens, and should not be used as the sole basis for treatment or other patient management decisions. Negative results must be combined with clinical observations, patient history, and epidemiological information. The expected result is Negative.  Fact Sheet for Patients: SugarRoll.be  Fact Sheet for Healthcare Providers: https://www.woods-mathews.com/  This test is not yet approved or cleared by the [place] FDA and  has been authorized for detection and/or diagnosis of SARS-CoV-2 by FDA under an Emergency Use Authorization (EUA). This EUA will remain  in effect (meaning this test can be used) for the duration of the COVID-19 declaration under Se ction 564(b)(1) of the Act, 21 U.S.C. section 360bbb-3(b)(1), unless the authorization is terminated or revoked sooner.  Performed at [facility], [place] [address] [place], [place] [address]       Studies: DG CHEST PORT 1 VIEW  Result Date:  02/06/2021 CLINICAL DATA:  Central line. EXAM: PORTABLE CHEST 1 VIEW COMPARISON:  Chest radiograph 02/03/2021. FINDINGS: Central venous catheter tip projects over the superior vena cava. Monitoring leads overlie the patient. Stable cardiomegaly. Heterogeneous opacities lung bases may represent atelectasis. No pleural effusion or pneumothorax. Osseous structures unremarkable. IMPRESSION: Central venous catheter tip projects over the superior vena cava. Cardiomegaly.  Atelectasis versus scarring. Electronically Signed   By: Lovey Newcomer M.D.   On: 02/06/2021 09:03    Scheduled Meds:  albuterol  2.5 mg Nebulization QID   amLODipine  5 mg Oral BID   aspirin EC  81 mg Oral q AM   atorvastatin  40 mg Oral QHS   calcitRIOL  0.25 mcg Oral Q breakfast   carvedilol  12.5 mg Oral BID WC   Chlorhexidine Gluconate Cloth  6 each Topical Q0600   cholecalciferol  2,000 Units Oral QPM   dicyclomine  10 mg Oral BID   ferrous sulfate  325 mg Oral Daily   fluticasone furoate-vilanterol  1 puff Inhalation Daily   And   umeclidinium bromide  1 puff Inhalation Daily   furosemide  80 mg Oral BID   gabapentin  300 mg Oral BID   gemfibrozil  600 mg Oral BID AC   heparin  5,000 Units Subcutaneous Q12H   insulin aspart  0-9 Units Subcutaneous TID WC   levothyroxine  50 mcg Oral Q0600   linagliptin  5 mg Oral Daily   montelukast  10 mg Oral QHS   pantoprazole  40 mg Oral Daily   sevelamer carbonate  800 mg Oral TID WC   sodium bicarbonate  650 mg Oral BID   sodium chloride flush  10-40 mL Intracatheter Q12H   vitamin A  10,000 Units Oral QPM   vitamin B-12  2,500 mcg Oral Daily    Continuous Infusions:  sodium chloride     sodium chloride       LOS: 1 day     Cristal Deer, MD [facility]  To reach me or the doctor on call, go to: [email or web address] Password [facility]  02/06/2021, 10:47 AM

## 2021-02-06 NOTE — Progress Notes (Addendum)
Pt admitted with tunneled catheter in R chest, placed within [facility] system. Discussed with Verdene Lennert RN, pt will need chest Xray to confirm tip placement to ensure it is safe to use.

## 2021-02-07 LAB — GLUCOSE, CAPILLARY
Glucose-Capillary: 186 mg/dL — ABNORMAL HIGH (ref 70–99)
Glucose-Capillary: 177 mg/dL — ABNORMAL HIGH (ref 70–99)
Glucose-Capillary: 182 mg/dL — ABNORMAL HIGH (ref 70–99)
Glucose-Capillary: 191 mg/dL — ABNORMAL HIGH (ref 70–99)

## 2021-02-07 MED ORDER — GABAPENTIN 300 MG PO CAPS
300.0000 mg | ORAL_CAPSULE | Freq: Every day | ORAL | Status: DC
  Administered 2021-02-08 – 2021-02-09 (×2): 300 mg via ORAL
  Filled 2021-02-07 (×2): qty 1

## 2021-02-07 MED ORDER — DARBEPOETIN ALFA 60 MCG/0.3ML IJ SOSY
60.0000 ug | PREFILLED_SYRINGE | INTRAMUSCULAR | Status: DC
  Filled 2021-02-07: qty 0.3

## 2021-02-07 NOTE — Progress Notes (Signed)
  [facility] Progress Note   Assessment/ Plan:   1 Hypoglycemia: per pt, she didn't eat enough.  Checking sugars here, have been adequate.  Per primary 2 CKDV--> ESRD: new start.  HD #1 02/06/21.  Has AVF which worked well  Followed by Dr Theador Hawthorne in [place] and wants to continue following with him upon discharge.  Also wants to do PD eventually.  Recommend visit by dialysis nurse educator tomorrow - HD #2 02/08/21 3.  Pericardial effusion- no tamponade per OSH records, but increased in size.  Expect to resolve with dialysis 4 Hypertension: reasonably well controlled 5 Anemia of ESRD: will give ESA in dialysis 6  Metabolic Bone Disease:  will check PTH 7  Nutrition: renal diet 8  Dispo: can go once CLIP'd  Subjective:    Seen in room.  HD #1 yesterday, did well.  No complaints today, has a lot of good questions.   Objective:   BP (!) 164/67 (BP Location: Right Arm)   Pulse 74   Temp 98.8 F (37.1 C)   Resp 18   Ht 5\' 7"  (1.702 m)   Wt 95 kg   SpO2 93%   BMI 32.80 kg/m   Physical Exam: GEN NAD, sitting I bed HEENT EOMI PERRL NECK R IJ powerpicc (not TDC for dialysis) PULM clear CV RRR, no rub ABD soft EXT trace LE edema NEURO AAO x 3 no asterixis SKIN no rashes/ lesions ACCESS: L AVF + T/B  Labs: BMET Recent Labs  Lab 02/06/21 0330  NA 139  K 4.2  CL 109  CO2 22  GLUCOSE 192*  BUN 54*  CREATININE 5.36*  CALCIUM 8.8*   CBC Recent Labs  Lab 02/06/21 0330  WBC 6.5  HGB 7.2*  HCT 23.1*  MCV 89.2  PLT 165      Medications:     albuterol  2.5 mg Nebulization QID   amLODipine  5 mg Oral BID   aspirin EC  81 mg Oral q AM   atorvastatin  40 mg Oral QHS   calcitRIOL  0.25 mcg Oral Q breakfast   carvedilol  12.5 mg Oral BID WC   Chlorhexidine Gluconate Cloth  6 each Topical Q0600   cholecalciferol  2,000 Units Oral QPM   [START ON 02/08/2021] darbepoetin (ARANESP) injection - DIALYSIS  60 mcg Intravenous Q Mon-HD   dicyclomine  10 mg  Oral BID   ferrous sulfate  325 mg Oral Daily   fluticasone furoate-vilanterol  1 puff Inhalation Daily   And   umeclidinium bromide  1 puff Inhalation Daily   furosemide  80 mg Oral BID   gabapentin  300 mg Oral BID   gemfibrozil  600 mg Oral BID AC   heparin  5,000 Units Subcutaneous Q12H   insulin aspart  0-9 Units Subcutaneous TID WC   levothyroxine  50 mcg Oral Q0600   linagliptin  5 mg Oral Daily   montelukast  10 mg Oral QHS   pantoprazole  40 mg Oral Daily   sevelamer carbonate  800 mg Oral TID WC   sodium bicarbonate  650 mg Oral BID   sodium chloride flush  10-40 mL Intracatheter Q12H   vitamin A  10,000 Units Oral QPM   vitamin B-12  2,500 mcg Oral Daily     Madelon Lips, MD 02/07/2021, 9:57 AM

## 2021-02-07 NOTE — Progress Notes (Signed)
PROGRESS NOTE  Cindy Park VCB:[id number] DOB: [birth year] DOA: 02/05/2021 PCP: Monico Blitz, MD  HPI/Recap of past 24 hours: This is a 65 year old female with medical history significant for chronic kidney disease stage IV not  on hemodialysis, insulin-dependent diabetes mellitus, asthma/COPD, hyperlipidemia hypertension hypothyroidism who presented with syncope with hypoglycemia.  Patient stated that she had not taken her insulin and did not eat enough food that time as a she passed out.  She presented to the emergency department they noted that her blood sugar was 32.  She was also noted to have pericardial effusion and cardiomegaly.  After consultation with nephrology ED advised to transfer her to [facility] for hemodialysis for the treatment of moderate pericardial effusion.  Patient seen and examined she was at hemodialysis She denied any new complaints states she felt better.  Updates  February 07, 2021 Patient seen and examined at bedside she is doing well she has been seen by nephrology she had hemodialysis for the first time yesterday the plan on doing another hemodialysis Monday and also set her up with outpatient dialysis chair  Assessment/Plan: Active Problems:   Hypoglycemia due to insulin   Hypoglycemia   Pericardial effusion  #1 hypoglycemia IV fluid was given has sugar has improved. Patient counseled regarding medication she adjusts her insulin if she does not eat enough  2.  Chronic kidney disease stage V patient has been started on hemodialysis today this is her first hemodialysis.  3.  Hypertension uncontrolled continue carvedilol and amlodipine  4.  Anemia of chronic disease.  Hemoglobin is 7.2 today patient was given ESA in dialysis  5.  Hyperlipidemia continue statins  6.  Hypothyroidism continue Synthroid  7.  Pericardial effusion, likely from the renal sinus, there was no tamponade Patient is relatively asymptomatic Patient was started on hemodialysis after  consultation with the nephrology due to her worsening renal function      Code Status: Full  Severity of Illness: The appropriate patient status for this patient is INPATIENT. Inpatient status is judged to be reasonable and necessary in order to provide the required intensity of service to ensure the patient's safety. The patient's presenting symptoms, physical exam findings, and initial radiographic and laboratory data in the context of their chronic comorbidities is felt to place them at high risk for further clinical deterioration. Furthermore, it is not anticipated that the patient will be medically stable for discharge from the hospital within 2 midnights of admission. The following factors support the patient status of inpatient.  Pericardial effusion end-stage renal disease requiring hemodialysis  * I certify that at the point of admission it is my clinical judgment that the patient will require inpatient hospital care spanning beyond 2 midnights from the point of admission due to high intensity of service, high risk for further deterioration and high frequency of surveillance required.*   Family Communication: None at bedside  Disposition Plan: Home in 1 to 2 days   Consultants: Nephrology  Procedures: Hemodialysis  Antimicrobials: None  DVT prophylaxis: Subacute heparin   Objective: Vitals:   02/06/21 2110 02/07/21 0604 02/07/21 0908 02/07/21 0911  BP:  (!) 164/67    Pulse:  74    Resp:  18    Temp:  98.8 F (37.1 C)    TempSrc:      SpO2: 97% 93% 93% 93%  Weight:      Height:        Intake/Output Summary (Last 24 hours) at 02/07/2021 1021 Last data filed  at 02/07/2021 0149 Gross per 24 hour  Intake 310 ml  Output 1900 ml  Net -1590 ml    Filed Weights   02/05/21 1700 02/06/21 1740  Weight: 95.3 kg 95 kg   Body mass index is 32.8 kg/m.  Exam:  General: 65 y.o. year-old female well developed well nourished in no acute distress.  Alert and oriented  x3.  Very pleasant Cardiovascular: Regular rate and rhythm with no rubs or gallops.  No thyromegaly or JVD noted.   Respiratory: Clear to auscultation with no wheezes or rales. Good inspiratory effort. Abdomen: Soft nontender nondistended with normal bowel sounds x4 quadrants. Musculoskeletal: No lower extremity edema. 2/4 pulses in all 4 extremities. Skin: No ulcerative lesions noted or rashes, Psychiatry: Mood is appropriate for condition and setting    Data Reviewed: CBC: Recent Labs  Lab 02/06/21 0330  WBC 6.5  HGB 7.2*  HCT 23.1*  MCV 89.2  PLT 681    Basic Metabolic Panel: Recent Labs  Lab 02/06/21 0330  NA 139  K 4.2  CL 109  CO2 22  GLUCOSE 192*  BUN 54*  CREATININE 5.36*  CALCIUM 8.8*    GFR: Estimated Creatinine Clearance: 12.7 mL/min (A) (by C-G formula based on SCr of 5.36 mg/dL (H)). Liver Function Tests: No results for input(s): AST, ALT, ALKPHOS, BILITOT, PROT, ALBUMIN in the last 168 hours. No results for input(s): LIPASE, AMYLASE in the last 168 hours. No results for input(s): AMMONIA in the last 168 hours. Coagulation Profile: No results for input(s): INR, PROTIME in the last 168 hours. Cardiac Enzymes: No results for input(s): CKTOTAL, CKMB, CKMBINDEX, TROPONINI in the last 168 hours. BNP (last 3 results) No results for input(s): PROBNP in the last 8760 hours. HbA1C: No results for input(s): HGBA1C in the last 72 hours. CBG: Recent Labs  Lab 02/06/21 0729 02/06/21 1153 02/06/21 1822 02/06/21 2216 02/07/21 0655  GLUCAP 155* 195* 134* 156* 186*    Lipid Profile: No results for input(s): CHOL, HDL, LDLCALC, TRIG, CHOLHDL, LDLDIRECT in the last 72 hours. Thyroid Function Tests: No results for input(s): TSH, T4TOTAL, FREET4, T3FREE, THYROIDAB in the last 72 hours. Anemia Panel: No results for input(s): VITAMINB12, FOLATE, FERRITIN, TIBC, IRON, RETICCTPCT in the last 72 hours. Urine analysis: No results found for: COLORURINE,  APPEARANCEUR, LABSPEC, PHURINE, GLUCOSEU, HGBUR, BILIRUBINUR, KETONESUR, PROTEINUR, UROBILINOGEN, NITRITE, LEUKOCYTESUR Sepsis Labs: @LABRCNTIP (procalcitonin:4,lacticidven:4)  ) Recent Results (from the past 240 hour(s))  SARS CORONAVIRUS 2 (TAT 6-24 HRS) Nasopharyngeal Nasopharyngeal Swab     Status: None   Collection Time: 02/05/21  7:18 PM   Specimen: Nasopharyngeal Swab  Result Value Ref Range Status   SARS Coronavirus 2 NEGATIVE NEGATIVE Final    Comment: (NOTE) SARS-CoV-2 target nucleic acids are NOT DETECTED.  The SARS-CoV-2 RNA is generally detectable in upper and lower respiratory specimens during the acute phase of infection. Negative results do not preclude SARS-CoV-2 infection, do not rule out co-infections with other pathogens, and should not be used as the sole basis for treatment or other patient management decisions. Negative results must be combined with clinical observations, patient history, and epidemiological information. The expected result is Negative.  Fact Sheet for Patients: SugarRoll.be  Fact Sheet for Healthcare Providers: https://www.woods-mathews.com/  This test is not yet approved or cleared by the [place] FDA and  has been authorized for detection and/or diagnosis of SARS-CoV-2 by FDA under an Emergency Use Authorization (EUA). This EUA will remain  in effect (meaning this test can be used) for  the duration of the COVID-19 declaration under Se ction 564(b)(1) of the Act, 21 U.S.C. section 360bbb-3(b)(1), unless the authorization is terminated or revoked sooner.  Performed at [facility], [place] [address] [place], [place] [address]        Studies: No results found.  Scheduled Meds:  albuterol  2.5 mg Nebulization QID   amLODipine  5 mg Oral BID   aspirin EC  81 mg Oral q AM   atorvastatin  40 mg Oral QHS   calcitRIOL  0.25 mcg Oral Q breakfast   carvedilol  12.5 mg Oral BID WC    Chlorhexidine Gluconate Cloth  6 each Topical Q0600   cholecalciferol  2,000 Units Oral QPM   [START ON 02/08/2021] darbepoetin (ARANESP) injection - DIALYSIS  60 mcg Intravenous Q Mon-HD   dicyclomine  10 mg Oral BID   ferrous sulfate  325 mg Oral Daily   fluticasone furoate-vilanterol  1 puff Inhalation Daily   And   umeclidinium bromide  1 puff Inhalation Daily   furosemide  80 mg Oral BID   gabapentin  300 mg Oral BID   gemfibrozil  600 mg Oral BID AC   heparin  5,000 Units Subcutaneous Q12H   insulin aspart  0-9 Units Subcutaneous TID WC   levothyroxine  50 mcg Oral Q0600   linagliptin  5 mg Oral Daily   montelukast  10 mg Oral QHS   pantoprazole  40 mg Oral Daily   sevelamer carbonate  800 mg Oral TID WC   sodium bicarbonate  650 mg Oral BID   sodium chloride flush  10-40 mL Intracatheter Q12H   vitamin A  10,000 Units Oral QPM   vitamin B-12  2,500 mcg Oral Daily    Continuous Infusions:  sodium chloride     sodium chloride       LOS: 2 days     Cristal Deer, MD [facility]  To reach me or the doctor on call, go to: [email or web address] Password [email or web address]  02/07/2021, 10:21 AM

## 2021-02-08 LAB — CBC
HCT: 24 % — ABNORMAL LOW (ref 36.0–46.0)
Hemoglobin: 7.8 g/dL — ABNORMAL LOW (ref 12.0–15.0)
MCH: 28.5 pg (ref 26.0–34.0)
MCHC: 32.5 g/dL (ref 30.0–36.0)
MCV: 87.6 fL (ref 80.0–100.0)
Platelets: 154 10*3/uL (ref 150–400)
RBC: 2.74 MIL/uL — ABNORMAL LOW (ref 3.87–5.11)
RDW: 13.5 % (ref 11.5–15.5)
WBC: 6 10*3/uL (ref 4.0–10.5)
nRBC: 0 % (ref 0.0–0.2)

## 2021-02-08 LAB — RENAL FUNCTION PANEL
Albumin: 2.7 g/dL — ABNORMAL LOW (ref 3.5–5.0)
Anion gap: 9 (ref 5–15)
BUN: 54 mg/dL — ABNORMAL HIGH (ref 8–23)
CO2: 25 mmol/L (ref 22–32)
Calcium: 8.9 mg/dL (ref 8.9–10.3)
Chloride: 107 mmol/L (ref 98–111)
Creatinine, Ser: 5.44 mg/dL — ABNORMAL HIGH (ref 0.44–1.00)
GFR, Estimated: 8 mL/min — ABNORMAL LOW (ref >60)
Glucose, Bld: 223 mg/dL — ABNORMAL HIGH (ref 70–99)
Phosphorus: 5.1 mg/dL — ABNORMAL HIGH (ref 2.5–4.6)
Potassium: 4 mmol/L (ref 3.5–5.1)
Sodium: 141 mmol/L (ref 135–145)

## 2021-02-08 LAB — GLUCOSE, CAPILLARY
Glucose-Capillary: 205 mg/dL — ABNORMAL HIGH (ref 70–99)
Glucose-Capillary: 137 mg/dL — ABNORMAL HIGH (ref 70–99)
Glucose-Capillary: 172 mg/dL — ABNORMAL HIGH (ref 70–99)

## 2021-02-08 LAB — HEMOGLOBIN A1C
Hgb A1c MFr Bld: 5.8 % — ABNORMAL HIGH (ref 4.8–5.6)
Mean Plasma Glucose: 120 mg/dL

## 2021-02-08 LAB — HEPATITIS B E ANTIBODY: Hep B E Ab: NEGATIVE

## 2021-02-08 MED ORDER — KIDNEY FAILURE BOOK: Freq: Once | Status: AC

## 2021-02-08 MED ORDER — ALTEPLASE 2 MG IJ SOLR: 2.0000 mg | Freq: Once | INTRAMUSCULAR | Status: DC

## 2021-02-08 MED ORDER — DARBEPOETIN ALFA 60 MCG/0.3ML IJ SOSY
PREFILLED_SYRINGE | INTRAMUSCULAR | Status: AC
  Administered 2021-02-08: 60 ug via INTRAVENOUS
  Filled 2021-02-08: qty 0.3

## 2021-02-08 NOTE — Progress Notes (Signed)
Renal Navigator read notes from Nephrologist/Dr. Hollie Salk from the weekend that state patient needs referral for in-center HD and follows with Dr. Theador Hawthorne in the office. She has a working AVF and lives in [place]. Navigator completed referral pending HepB Surface Antibody, as she has an Hepatitis B e antibody, which is not the correct lab. Navigator called Dr. Justin Mend to order and will fax when it results. Navigator will follow up with patient.  Alphonzo Cruise, [place] Renal Navigator [phone]

## 2021-02-08 NOTE — Progress Notes (Signed)
Review of consult for CVAD with no blood return. Consult addressed at 1850 by evening VAST RN who started communication with RN regarding whether line should be removed or TPA given. RN states he is awaiting response from MD regarding this and will notify VAST of decision.

## 2021-02-08 NOTE — Plan of Care (Signed)
  Problem: Education: Goal: Knowledge of disease and its progression will improve Outcome: Progressing   Problem: Clinical Measurements: Goal: Complications related to the disease process or treatment will be avoided or minimized Outcome: Progressing   Problem: Fluid Volume: Goal: Fluid volume balance will be maintained or improved Outcome: Progressing

## 2021-02-08 NOTE — Progress Notes (Signed)
Rounded on patient today in hemodialysis in correlation to transition to outpatient HD. Ordered Kidney Failure book. Patient educated at the bedside regarding care of AV fistula, site care, assessment of thrill daily and proper medication administration on HD days.  Patient also educated on the importance of adhering to scheduled dialysis treatments, the effects of fluid overload, hyperkalemia and hyperphosphatemia. Patient capable of re-verbalizing via teach back method. Patient reports that her husband will be her main support system for transportation. She also reports that she has been putting this off for a long time in order to care for her adult children. Patient is very agreeable to the processes with plans to transition to PD once she is able. This RN also noted a R picc line in place. Patient with no current orders for antibiotics or fluids. Plan to discuss with patients nurse for possible D/C of line. Patient with no further questions at this time. Handouts and contact information provided to patient for any further assistance. Will follow as appropriate.   Dorthey Sawyer, RN  Dialysis Nurse Coordinator Phone: [phone]

## 2021-02-08 NOTE — Progress Notes (Signed)
PROGRESS NOTE  Cindy Park CXK:[id number] DOB: [birth year] DOA: 02/05/2021 PCP: Monico Blitz, MD  HPI/Recap of past 24 hours: This is a 65 year old female with medical history significant for chronic kidney disease stage IV not  on hemodialysis, insulin-dependent diabetes mellitus, asthma/COPD, hyperlipidemia hypertension hypothyroidism who presented with syncope with hypoglycemia.  Patient stated that she had not taken her insulin and did not eat enough food that time as a she passed out.  She presented to the emergency department they noted that her blood sugar was 32.  She was also noted to have pericardial effusion and cardiomegaly.  After consultation with nephrology ED advised to transfer her to [facility] for hemodialysis for the treatment of moderate pericardial effusion.  Patient seen and examined she was at hemodialysis She denied any new complaints states she felt better.  Updates  February 07, 2021 Patient seen and examined at bedside she is doing well she has been seen by nephrology she had hemodialysis for the first time yesterday the plan on doing another hemodialysis Monday and also set her up with outpatient dialysis chair  February 08, 2021: Patient seen and examined at bedside she has just completed her second hemodialysis today.  She denies any problems no complaints Patient is waiting for hemodialysis chair to be set up as outpatient  Assessment/Plan: Active Problems:   Hypoglycemia due to insulin   Hypoglycemia   Pericardial effusion  #1 hypoglycemia IV fluid was given has sugar has resolved.  Her blood sugars are going up.  Will cover with sliding scale Patient counseled regarding medication she adjusts her insulin if she does not eat enough  2.  Chronic kidney disease stage V patient has been started on hemodialysis today this is her second hemodialysis.  First hemodialysis was Saturday.  Arrangement will be made for her to be given a hemodialysis chair as outpatient  for continued hemodialysis  3.  Hypertension uncontrolled continue carvedilol and amlodipine  4.  Anemia of chronic disease.  Hemoglobin is 7.2 today patient was given ESA in dialysis  5.  Hyperlipidemia continue statins  6.  Hypothyroidism continue Synthroid  7.  Pericardial effusion, likely from the renal sinus, there was no tamponade Patient is relatively asymptomatic Patient was started on hemodialysis after consultation with the nephrology due to her worsening renal function      Code Status: Full  Severity of Illness: The appropriate patient status for this patient is INPATIENT. Inpatient status is judged to be reasonable and necessary in order to provide the required intensity of service to ensure the patient's safety. The patient's presenting symptoms, physical exam findings, and initial radiographic and laboratory data in the context of their chronic comorbidities is felt to place them at high risk for further clinical deterioration. Furthermore, it is not anticipated that the patient will be medically stable for discharge from the hospital within 2 midnights of admission. The following factors support the patient status of inpatient.  Pericardial effusion end-stage renal disease requiring hemodialysis  * I certify that at the point of admission it is my clinical judgment that the patient will require inpatient hospital care spanning beyond 2 midnights from the point of admission due to high intensity of service, high risk for further deterioration and high frequency of surveillance required.*   Family Communication: None at bedside  Disposition Plan: Home in 1 to 2 days   Consultants: Nephrology  Procedures: Hemodialysis  Antimicrobials: None  DVT prophylaxis: Subacute heparin   Objective: Vitals:   02/08/21  1230 02/08/21 1243 02/08/21 1418 02/08/21 1540  BP: (!) 176/72 (!) 170/78 (!) 164/52   Pulse: 80 82 81   Resp:   16   Temp:  98.9 F (37.2 C) 98.4 F  (36.9 C)   TempSrc:  Oral Oral   SpO2:  96% 95% 96%  Weight:  88 kg    Height:        Intake/Output Summary (Last 24 hours) at 02/08/2021 1911 Last data filed at 02/08/2021 1600 Gross per 24 hour  Intake 480 ml  Output 500 ml  Net -20 ml    Filed Weights   02/06/21 1740 02/08/21 0938 02/08/21 1243  Weight: 95 kg 88.5 kg 88 kg   Body mass index is 30.39 kg/m.  Exam:  General: 65 y.o. year-old female well developed well nourished in no acute distress.  Alert and oriented x3.  Very pleasant Cardiovascular: Regular rate and rhythm with no rubs or gallops.  No thyromegaly or JVD noted.   Respiratory: Clear to auscultation with no wheezes or rales. Good inspiratory effort. Abdomen: Soft nontender nondistended with normal bowel sounds x4 quadrants. Musculoskeletal: No lower extremity edema. 2/4 pulses in all 4 extremities. Skin: No ulcerative lesions noted or rashes, Psychiatry: Mood is appropriate for condition and setting    Data Reviewed: CBC: Recent Labs  Lab 02/06/21 0330 02/08/21 0908  WBC 6.5 6.0  HGB 7.2* 7.8*  HCT 23.1* 24.0*  MCV 89.2 87.6  PLT 165 546    Basic Metabolic Panel: Recent Labs  Lab 02/06/21 0330 02/08/21 0908  NA 139 141  K 4.2 4.0  CL 109 107  CO2 22 25  GLUCOSE 192* 223*  BUN 54* 54*  CREATININE 5.36* 5.44*  CALCIUM 8.8* 8.9  PHOS  --  5.1*    GFR: Estimated Creatinine Clearance: 12.1 mL/min (A) (by C-G formula based on SCr of 5.44 mg/dL (H)). Liver Function Tests: Recent Labs  Lab 02/08/21 0908  ALBUMIN 2.7*   No results for input(s): LIPASE, AMYLASE in the last 168 hours. No results for input(s): AMMONIA in the last 168 hours. Coagulation Profile: No results for input(s): INR, PROTIME in the last 168 hours. Cardiac Enzymes: No results for input(s): CKTOTAL, CKMB, CKMBINDEX, TROPONINI in the last 168 hours. BNP (last 3 results) No results for input(s): PROBNP in the last 8760 hours. HbA1C: Recent Labs    02/06/21 0330   HGBA1C 5.8*   CBG: Recent Labs  Lab 02/07/21 1634 02/07/21 2039 02/08/21 0709 02/08/21 1414 02/08/21 1735  GLUCAP 182* 191* 205* 137* 172*    Lipid Profile: No results for input(s): CHOL, HDL, LDLCALC, TRIG, CHOLHDL, LDLDIRECT in the last 72 hours. Thyroid Function Tests: No results for input(s): TSH, T4TOTAL, FREET4, T3FREE, THYROIDAB in the last 72 hours. Anemia Panel: No results for input(s): VITAMINB12, FOLATE, FERRITIN, TIBC, IRON, RETICCTPCT in the last 72 hours. Urine analysis: No results found for: COLORURINE, APPEARANCEUR, LABSPEC, PHURINE, GLUCOSEU, HGBUR, BILIRUBINUR, KETONESUR, PROTEINUR, UROBILINOGEN, NITRITE, LEUKOCYTESUR Sepsis Labs: @LABRCNTIP (procalcitonin:4,lacticidven:4)  ) Recent Results (from the past 240 hour(s))  SARS CORONAVIRUS 2 (TAT 6-24 HRS) Nasopharyngeal Nasopharyngeal Swab     Status: None   Collection Time: 02/05/21  7:18 PM   Specimen: Nasopharyngeal Swab  Result Value Ref Range Status   SARS Coronavirus 2 NEGATIVE NEGATIVE Final    Comment: (NOTE) SARS-CoV-2 target nucleic acids are NOT DETECTED.  The SARS-CoV-2 RNA is generally detectable in upper and lower respiratory specimens during the acute phase of infection. Negative results do not preclude  SARS-CoV-2 infection, do not rule out co-infections with other pathogens, and should not be used as the sole basis for treatment or other patient management decisions. Negative results must be combined with clinical observations, patient history, and epidemiological information. The expected result is Negative.  Fact Sheet for Patients: SugarRoll.be  Fact Sheet for Healthcare Providers: https://www.woods-mathews.com/  This test is not yet approved or cleared by the [place] FDA and  has been authorized for detection and/or diagnosis of SARS-CoV-2 by FDA under an Emergency Use Authorization (EUA). This EUA will remain  in effect (meaning this  test can be used) for the duration of the COVID-19 declaration under Se ction 564(b)(1) of the Act, 21 U.S.C. section 360bbb-3(b)(1), unless the authorization is terminated or revoked sooner.  Performed at [facility], [place] [address] [place], [place] [address]        Studies: No results found.  Scheduled Meds:  albuterol  2.5 mg Nebulization QID   alteplase  2 mg Intracatheter Once   amLODipine  5 mg Oral BID   aspirin EC  81 mg Oral q AM   atorvastatin  40 mg Oral QHS   calcitRIOL  0.25 mcg Oral Q breakfast   carvedilol  12.5 mg Oral BID WC   Chlorhexidine Gluconate Cloth  6 each Topical Q0600   cholecalciferol  2,000 Units Oral QPM   darbepoetin (ARANESP) injection - DIALYSIS  60 mcg Intravenous Q Mon-HD   dicyclomine  10 mg Oral BID   ferrous sulfate  325 mg Oral Daily   fluticasone furoate-vilanterol  1 puff Inhalation Daily   And   umeclidinium bromide  1 puff Inhalation Daily   furosemide  80 mg Oral BID   gabapentin  300 mg Oral QHS   gemfibrozil  600 mg Oral BID AC   heparin  5,000 Units Subcutaneous Q12H   insulin aspart  0-9 Units Subcutaneous TID WC   levothyroxine  50 mcg Oral Q0600   linagliptin  5 mg Oral Daily   montelukast  10 mg Oral QHS   pantoprazole  40 mg Oral Daily   sevelamer carbonate  800 mg Oral TID WC   sodium bicarbonate  650 mg Oral BID   sodium chloride flush  10-40 mL Intracatheter Q12H   vitamin A  10,000 Units Oral QPM   vitamin B-12  2,500 mcg Oral Daily    Continuous Infusions:  sodium chloride     sodium chloride       LOS: 3 days     Cristal Deer, MD [facility]  To reach me or the doctor on call, go to: [email or web address] Password [facility]  02/08/2021, 7:11 PM

## 2021-02-08 NOTE — Progress Notes (Signed)
[facility] ROUNDING NOTE   Subjective:   Interval History: This is a 65 year old lady with a history of chronic disease stage IV not on hemodialysis on admission.  Insulin-dependent diabetes hyperlipidemia hypertension presented with syncope with hypoglycemia.  She was advised to start dialysis due to moderate pericardial effusion.  Blood pressure 140 566 pulse 80 temperature 98.8 O2 sats 96% room  Labs from 02/06/2021 potassium 4.2 CO2 22 creatinine 5.36 phosphorus was 4.9 albumin 3.4 calcium 8.4 iron saturations 26% PTH 178  Objective:  Vital signs in last 24 hours:  Temp:  [98.3 F (36.8 C)-98.6 F (37 C)] 98.3 F (36.8 C) (06/20 0556) Pulse Rate:  [78-86] 80 (06/20 0556) Resp:  [18] 18 (06/20 0556) BP: (145-156)/(60-69) 145/66 (06/20 0556) SpO2:  [93 %-99 %] 96 % (06/20 0556)  Weight change:  Filed Weights   02/05/21 1700 02/06/21 1740  Weight: 95.3 kg 95 kg    Intake/Output: I/O last 3 completed shifts: In: -  Out: 800 [Urine:800]   Intake/Output this shift:  No intake/output data recorded.  GEN NAD, sitting I bed HEENT EOMI PERRL NECK R IJ powerpicc (not TDC for dialysis) PULM clear CV RRR, no rub ABD soft EXT trace LE edema NEURO AAO x 3 no asterixis SKIN no rashes/ lesions ACCESS: L AVF + T/B   Basic Metabolic Panel: Recent Labs  Lab 02/06/21 0330  NA 139  K 4.2  CL 109  CO2 22  GLUCOSE 192*  BUN 54*  CREATININE 5.36*  CALCIUM 8.8*    Liver Function Tests: No results for input(s): AST, ALT, ALKPHOS, BILITOT, PROT, ALBUMIN in the last 168 hours. No results for input(s): LIPASE, AMYLASE in the last 168 hours. No results for input(s): AMMONIA in the last 168 hours.  CBC: Recent Labs  Lab 02/06/21 0330  WBC 6.5  HGB 7.2*  HCT 23.1*  MCV 89.2  PLT 165    Cardiac Enzymes: No results for input(s): CKTOTAL, CKMB, CKMBINDEX, TROPONINI in the last 168 hours.  BNP: Invalid input(s): POCBNP  CBG: Recent Labs  Lab  02/07/21 0655 02/07/21 1140 02/07/21 1634 02/07/21 2039 02/08/21 0709  GLUCAP 186* 177* 182* 191* 205*    Microbiology: Results for orders placed or performed during the hospital encounter of 02/05/21  SARS CORONAVIRUS 2 (TAT 6-24 HRS) Nasopharyngeal Nasopharyngeal Swab     Status: None   Collection Time: 02/05/21  7:18 PM   Specimen: Nasopharyngeal Swab  Result Value Ref Range Status   SARS Coronavirus 2 NEGATIVE NEGATIVE Final    Comment: (NOTE) SARS-CoV-2 target nucleic acids are NOT DETECTED.  The SARS-CoV-2 RNA is generally detectable in upper and lower respiratory specimens during the acute phase of infection. Negative results do not preclude SARS-CoV-2 infection, do not rule out co-infections with other pathogens, and should not be used as the sole basis for treatment or other patient management decisions. Negative results must be combined with clinical observations, patient history, and epidemiological information. The expected result is Negative.  Fact Sheet for Patients: SugarRoll.be  Fact Sheet for Healthcare Providers: https://www.woods-mathews.com/  This test is not yet approved or cleared by the [place] FDA and  has been authorized for detection and/or diagnosis of SARS-CoV-2 by FDA under an Emergency Use Authorization (EUA). This EUA will remain  in effect (meaning this test can be used) for the duration of the COVID-19 declaration under Se ction 564(b)(1) of the Act, 21 U.S.C. section 360bbb-3(b)(1), unless the authorization is terminated or revoked sooner.  Performed at  [facility], [place] [address], [place], [place] [address]     Coagulation Studies: No results for input(s): LABPROT, INR in the last 72 hours.  Urinalysis: No results for input(s): COLORURINE, LABSPEC, PHURINE, GLUCOSEU, HGBUR, BILIRUBINUR, KETONESUR, PROTEINUR, UROBILINOGEN, NITRITE, LEUKOCYTESUR in the last 72 hours.  Invalid  input(s): APPERANCEUR    Imaging: No results found.   Medications:    sodium chloride     sodium chloride      albuterol  2.5 mg Nebulization QID   amLODipine  5 mg Oral BID   aspirin EC  81 mg Oral q AM   atorvastatin  40 mg Oral QHS   calcitRIOL  0.25 mcg Oral Q breakfast   carvedilol  12.5 mg Oral BID WC   Chlorhexidine Gluconate Cloth  6 each Topical Q0600   cholecalciferol  2,000 Units Oral QPM   darbepoetin (ARANESP) injection - DIALYSIS  60 mcg Intravenous Q Mon-HD   dicyclomine  10 mg Oral BID   ferrous sulfate  325 mg Oral Daily   fluticasone furoate-vilanterol  1 puff Inhalation Daily   And   umeclidinium bromide  1 puff Inhalation Daily   furosemide  80 mg Oral BID   gabapentin  300 mg Oral QHS   gemfibrozil  600 mg Oral BID AC   heparin  5,000 Units Subcutaneous Q12H   insulin aspart  0-9 Units Subcutaneous TID WC   levothyroxine  50 mcg Oral Q0600   linagliptin  5 mg Oral Daily   montelukast  10 mg Oral QHS   pantoprazole  40 mg Oral Daily   sevelamer carbonate  800 mg Oral TID WC   sodium bicarbonate  650 mg Oral BID   sodium chloride flush  10-40 mL Intracatheter Q12H   vitamin A  10,000 Units Oral QPM   vitamin B-12  2,500 mcg Oral Daily   sodium chloride, sodium chloride, acetaminophen, albuterol, clonazePAM, lidocaine (PF), lidocaine-prilocaine, ondansetron, oxyCODONE-acetaminophen, pentafluoroprop-tetrafluoroeth, sodium chloride flush, triamcinolone cream  Assessment/ Plan:  CKD 5 new start hemodialysis first dialysis treatment 02/06/2021 follow-up with Dr. Theador Hawthorne in [place] wants to continue to follow with him on discharge.  Also wants to continue to do peritoneal dialysis at some point.  Next dialysis treatment 01/21/2021 Pericardial effusion hopefully will resolve with dialysis Anemia continue ESA 60 mcg q. Monday Metabolic bone disease PTH 178 Patient for clip process and discharge    LOS: [place] @TODAY @7 :22 AM

## 2021-02-09 LAB — HEPATITIS B CORE ANTIBODY, IGM: Hep B C IgM: NONREACTIVE

## 2021-02-09 LAB — GLUCOSE, CAPILLARY
Glucose-Capillary: 193 mg/dL — ABNORMAL HIGH (ref 70–99)
Glucose-Capillary: 225 mg/dL — ABNORMAL HIGH (ref 70–99)
Glucose-Capillary: 204 mg/dL — ABNORMAL HIGH (ref 70–99)
Glucose-Capillary: 209 mg/dL — ABNORMAL HIGH (ref 70–99)

## 2021-02-09 LAB — HEPATITIS B SURFACE ANTIGEN: Hepatitis B Surface Ag: NONREACTIVE

## 2021-02-09 MED ORDER — CHLORHEXIDINE GLUCONATE CLOTH 2 % EX PADS: 6.0000 | MEDICATED_PAD | Freq: Every day | CUTANEOUS | Status: DC

## 2021-02-09 NOTE — Plan of Care (Signed)
  Problem: Education: Goal: Knowledge of disease and its progression will improve Outcome: Progressing   Problem: Health Behavior/Discharge Planning: Goal: Ability to manage health-related needs will improve Outcome: Progressing   Problem: Clinical Measurements: Goal: Complications related to the disease process or treatment will be avoided or minimized Outcome: Progressing Goal: Dialysis access will remain free of complications [id number] by Dolores Hoose, RN Outcome: Progressing 02/09/2021 0756 by Dolores Hoose, RN Outcome: Progressing   Problem: Fluid Volume: Goal: Fluid volume balance will be maintained or improved Outcome: Progressing   Problem: Nutritional: Goal: Ability to make appropriate dietary choices will improve 02/09/2021 1153 by Dolores Hoose, RN Outcome: Progressing 02/09/2021 0756 by Dolores Hoose, RN Outcome: Progressing   Problem: Self-Concept: Goal: Body image disturbance will be avoided or minimized Outcome: Progressing

## 2021-02-09 NOTE — Progress Notes (Signed)
Follow up completed for earlier consult. No new orders received regarding CVAD at this time. Red lumen occluded and unable to flush; advised RN against use until further instruction from MD. Pt has peripheral IV access at this time.

## 2021-02-09 NOTE — Progress Notes (Signed)
Renal Navigator received call from [facility] Admissions Coordinator/Taylor following up on referral submitted yesterday by Navigator for outpatient HD seat schedule at [facility]. She states she has received everything needed except HepB surface antibody. Navigator explained that this lab was drawn today and still pending and will be faxed when it results. Navigator cannot obtain seat from [facility] until this lab is faxed and referral is, therefore, complete. Navigator monitoring closely.   Alphonzo Cruise, [place] Renal Navigator [phone]

## 2021-02-09 NOTE — Plan of Care (Signed)
  Problem: Education: Goal: Knowledge of disease and its progression will improve Outcome: Progressing   Problem: Clinical Measurements: Goal: Dialysis access will remain free of complications Outcome: Progressing   Problem: Nutritional: Goal: Ability to make appropriate dietary choices will improve Outcome: Progressing

## 2021-02-09 NOTE — Plan of Care (Signed)
  Problem: Education: Goal: Knowledge of disease and its progression will improve Outcome: Progressing   Problem: Fluid Volume: Goal: Fluid volume balance will be maintained or improved Outcome: Progressing

## 2021-02-09 NOTE — Progress Notes (Signed)
[facility] ROUNDING NOTE   Subjective:   Interval History: This is a 65 year old lady with a history of chronic disease stage IV not on hemodialysis on admission.  Insulin-dependent diabetes hyperlipidemia hypertension presented with syncope with hypoglycemia.  She was advised to start dialysis due to moderate pericardial effusion.  Blood pressure 169/61 pulse 80 temperature 98.1 O2 sats 93%  Underwent dialysis 02/08/2021 with 500 cc removed.  Next dialysis to be 02/10/2021  Labs from 02/08/2021 sodium 141 potassium 4 chloride 107 CO2 25 BUN 34 creatinine 5.44 glucose 223 calcium 8.9 phosphorus 5.1 albumin 2.7 hemoglobin 7.8  Objective:  Vital signs in last 24 hours:  Temp:  [98.1 F (36.7 C)-98.9 F (37.2 C)] 98.1 F (36.7 C) (06/21 1013) Pulse Rate:  [77-85] 80 (06/21 1013) Resp:  [16-18] 17 (06/21 1013) BP: (154-176)/(52-78) 169/61 (06/21 1013) SpO2:  [93 %-97 %] 93 % (06/21 1013) Weight:  [88 kg-89 kg] 89 kg (06/20 2209)  Weight change:  Filed Weights   02/08/21 0938 02/08/21 1243 02/08/21 2209  Weight: 88.5 kg 88 kg 89 kg    Intake/Output: I/O last 3 completed shifts: In: 540 [P.O.:540] Out: 500 [Other:500]   Intake/Output this shift:  No intake/output data recorded.  GEN NAD, sitting I bed HEENT EOMI PERRL NECK R IJ powerpicc (not TDC for dialysis) PULM clear CV RRR, no rub ABD soft EXT trace LE edema NEURO AAO x 3 no asterixis SKIN no rashes/ lesions ACCESS: L AVF + T/B   Basic Metabolic Panel: Recent Labs  Lab 02/06/21 0330 02/08/21 0908  NA 139 141  K 4.2 4.0  CL 109 107  CO2 22 25  GLUCOSE 192* 223*  BUN 54* 54*  CREATININE 5.36* 5.44*  CALCIUM 8.8* 8.9  PHOS  --  5.1*     Liver Function Tests: Recent Labs  Lab 02/08/21 0908  ALBUMIN 2.7*   No results for input(s): LIPASE, AMYLASE in the last 168 hours. No results for input(s): AMMONIA in the last 168 hours.  CBC: Recent Labs  Lab 02/06/21 0330 02/08/21 0908  WBC  6.5 6.0  HGB 7.2* 7.8*  HCT 23.1* 24.0*  MCV 89.2 87.6  PLT 165 154     Cardiac Enzymes: No results for input(s): CKTOTAL, CKMB, CKMBINDEX, TROPONINI in the last 168 hours.  BNP: Invalid input(s): POCBNP  CBG: Recent Labs  Lab 02/08/21 0709 02/08/21 1414 02/08/21 1735 02/08/21 2210 02/09/21 0739  GLUCAP 205* 137* 172* 193* 225*     Microbiology: Results for orders placed or performed during the hospital encounter of 02/05/21  SARS CORONAVIRUS 2 (TAT 6-24 HRS) Nasopharyngeal Nasopharyngeal Swab     Status: None   Collection Time: 02/05/21  7:18 PM   Specimen: Nasopharyngeal Swab  Result Value Ref Range Status   SARS Coronavirus 2 NEGATIVE NEGATIVE Final    Comment: (NOTE) SARS-CoV-2 target nucleic acids are NOT DETECTED.  The SARS-CoV-2 RNA is generally detectable in upper and lower respiratory specimens during the acute phase of infection. Negative results do not preclude SARS-CoV-2 infection, do not rule out co-infections with other pathogens, and should not be used as the sole basis for treatment or other patient management decisions. Negative results must be combined with clinical observations, patient history, and epidemiological information. The expected result is Negative.  Fact Sheet for Patients: SugarRoll.be  Fact Sheet for Healthcare Providers: https://www.woods-mathews.com/  This test is not yet approved or cleared by the [place] FDA and  has been authorized for detection and/or diagnosis of  SARS-CoV-2 by FDA under an Emergency Use Authorization (EUA). This EUA will remain  in effect (meaning this test can be used) for the duration of the COVID-19 declaration under Se ction 564(b)(1) of the Act, 21 U.S.C. section 360bbb-3(b)(1), unless the authorization is terminated or revoked sooner.  Performed at [facility], [place] [address] [place], [place] [address]     Coagulation Studies: No results  for input(s): LABPROT, INR in the last 72 hours.  Urinalysis: No results for input(s): COLORURINE, LABSPEC, PHURINE, GLUCOSEU, HGBUR, BILIRUBINUR, KETONESUR, PROTEINUR, UROBILINOGEN, NITRITE, LEUKOCYTESUR in the last 72 hours.  Invalid input(s): APPERANCEUR    Imaging: No results found.   Medications:    sodium chloride     sodium chloride      alteplase  2 mg Intracatheter Once   amLODipine  5 mg Oral BID   aspirin EC  81 mg Oral q AM   atorvastatin  40 mg Oral QHS   calcitRIOL  0.25 mcg Oral Q breakfast   carvedilol  12.5 mg Oral BID WC   Chlorhexidine Gluconate Cloth  6 each Topical Q0600   cholecalciferol  2,000 Units Oral QPM   darbepoetin (ARANESP) injection - DIALYSIS  60 mcg Intravenous Q Mon-HD   dicyclomine  10 mg Oral BID   ferrous sulfate  325 mg Oral Daily   fluticasone furoate-vilanterol  1 puff Inhalation Daily   And   umeclidinium bromide  1 puff Inhalation Daily   furosemide  80 mg Oral BID   gabapentin  300 mg Oral QHS   gemfibrozil  600 mg Oral BID AC   heparin  5,000 Units Subcutaneous Q12H   insulin aspart  0-9 Units Subcutaneous TID WC   levothyroxine  50 mcg Oral Q0600   linagliptin  5 mg Oral Daily   montelukast  10 mg Oral QHS   pantoprazole  40 mg Oral Daily   sevelamer carbonate  800 mg Oral TID WC   sodium bicarbonate  650 mg Oral BID   sodium chloride flush  10-40 mL Intracatheter Q12H   vitamin A  10,000 Units Oral QPM   vitamin B-12  2,500 mcg Oral Daily   sodium chloride, sodium chloride, acetaminophen, albuterol, clonazePAM, lidocaine (PF), lidocaine-prilocaine, ondansetron, oxyCODONE-acetaminophen, pentafluoroprop-tetrafluoroeth, sodium chloride flush, triamcinolone cream  Assessment/ Plan:  CKD 5 new start hemodialysis first dialysis treatment 02/06/2021 follow-up with Dr. Theador Hawthorne in [place] wants to continue to follow with him on discharge.  Also wants to continue to do peritoneal dialysis at some point.  Next dialysis  02/10/2021 Pericardial effusion hopefully will resolve with dialysis Anemia continue ESA 60 mcg q. Monday Metabolic bone disease PTH 178 Patient for clip process and discharge    LOS: [place] @TODAY @11 :33 AM

## 2021-02-09 NOTE — Progress Notes (Signed)
PROGRESS NOTE  Cindy Park KNL:[id number] DOB: 1956-03-01 DOA: 02/05/2021 PCP: Monico Blitz, MD  HPI/Recap of past 24 hours: This is a 65 year old female with medical history significant for chronic kidney disease stage IV not  on hemodialysis, insulin-dependent diabetes mellitus, asthma/COPD, hyperlipidemia hypertension hypothyroidism who presented with syncope with hypoglycemia.  Patient stated that she had not taken her insulin and did not eat enough food that time as a she passed out.  She presented to the emergency department they noted that her blood sugar was 32.  She was also noted to have pericardial effusion and cardiomegaly.  After consultation with nephrology ED advised to transfer her to [facility] for hemodialysis for the treatment of moderate pericardial effusion.  Patient seen and examined she was at hemodialysis She denied any new complaints states she felt better.  Updates  February 07, 2021 Patient seen and examined at bedside she is doing well she has been seen by nephrology she had hemodialysis for the first time yesterday the plan on doing another hemodialysis Monday and also set her up with outpatient dialysis chair  February 08, 2021: Patient seen and examined at bedside she has just completed her second hemodialysis today.  She denies any problems no complaints Patient is waiting for hemodialysis chair to be set up as outpatient  February 10, 2020: Patient seen and examined at bedside denies any new complaints.  Still working on getting her a dialysis chair at the dialysis navigator is monitoring and coordinating   Assessment/Plan: Active Problems:   Hypoglycemia due to insulin   Hypoglycemia   Pericardial effusion  #1 hypoglycemia IV fluid was given has sugar has resolved.  Her blood sugars are going up.  Will cover with sliding scale Patient counseled regarding medication she adjusts her insulin if she does not eat enough  2.  Chronic kidney disease stage V patient  has been started on hemodialysis today this is her second hemodialysis.  First hemodialysis was Saturday.  Arrangement will be made for her to be given a hemodialysis chair as outpatient for continued hemodialysis  3.  Hypertension uncontrolled continue carvedilol and amlodipine  4.  Anemia of chronic disease.  Hemoglobin is 7.2 today patient was given ESA in dialysis  5.  Hyperlipidemia continue statins  6.  Hypothyroidism continue Synthroid  7.  Pericardial effusion, likely from the renal sinus, there was no tamponade Patient is relatively asymptomatic Patient was started on hemodialysis after consultation with the nephrology due to her worsening renal function      Code Status: Full  Severity of Illness: The appropriate patient status for this patient is INPATIENT. Inpatient status is judged to be reasonable and necessary in order to provide the required intensity of service to ensure the patient's safety. The patient's presenting symptoms, physical exam findings, and initial radiographic and laboratory data in the context of their chronic comorbidities is felt to place them at high risk for further clinical deterioration. Furthermore, it is not anticipated that the patient will be medically stable for discharge from the hospital within 2 midnights of admission. The following factors support the patient status of inpatient.  Pericardial effusion end-stage renal disease requiring hemodialysis  * I certify that at the point of admission it is my clinical judgment that the patient will require inpatient hospital care spanning beyond 2 midnights from the point of admission due to high intensity of service, high risk for further deterioration and high frequency of surveillance required.*   Family Communication: None at  bedside  Disposition Plan: Home in 1 to 2 days   Consultants: Nephrology  Procedures: Hemodialysis  Antimicrobials: None  DVT prophylaxis: Subacute  heparin   Objective: Vitals:   02/09/21 0553 02/09/21 0818 02/09/21 1013 02/09/21 1640  BP: (!) 154/57  (!) 169/61 (!) 145/67  Pulse: 77  80 82  Resp: 18  17 16   Temp: 98.2 F (36.8 C)  98.1 F (36.7 C) 98.7 F (37.1 C)  TempSrc: Oral  Oral Oral  SpO2: 96% 97% 93% 94%  Weight:   89 kg   Height:        Intake/Output Summary (Last 24 hours) at 02/09/2021 1935 Last data filed at 02/09/2021 1700 Gross per 24 hour  Intake 1020 ml  Output 0 ml  Net 1020 ml    Filed Weights   02/08/21 1243 02/08/21 2209 02/09/21 1013  Weight: 88 kg 89 kg 89 kg   Body mass index is 30.73 kg/m.  Exam:  General: 65 y.o. year-old female well developed well nourished in no acute distress.  Alert and oriented x3.  Very pleasant Cardiovascular: Regular rate and rhythm with no rubs or gallops.  No thyromegaly or JVD noted.   Respiratory: Clear to auscultation with no wheezes or rales. Good inspiratory effort. Abdomen: Soft nontender nondistended with normal bowel sounds x4 quadrants. Musculoskeletal: No lower extremity edema. 2/4 pulses in all 4 extremities. Skin: No ulcerative lesions noted or rashes, Psychiatry: Mood is appropriate for condition and setting    Data Reviewed: CBC: Recent Labs  Lab 02/06/21 0330 02/08/21 0908  WBC 6.5 6.0  HGB 7.2* 7.8*  HCT 23.1* 24.0*  MCV 89.2 87.6  PLT 165 960    Basic Metabolic Panel: Recent Labs  Lab 02/06/21 0330 02/08/21 0908  NA 139 141  K 4.2 4.0  CL 109 107  CO2 22 25  GLUCOSE 192* 223*  BUN 54* 54*  CREATININE 5.36* 5.44*  CALCIUM 8.8* 8.9  PHOS  --  5.1*    GFR: Estimated Creatinine Clearance: 12.1 mL/min (A) (by C-G formula based on SCr of 5.44 mg/dL (H)). Liver Function Tests: Recent Labs  Lab 02/08/21 0908  ALBUMIN 2.7*    No results for input(s): LIPASE, AMYLASE in the last 168 hours. No results for input(s): AMMONIA in the last 168 hours. Coagulation Profile: No results for input(s): INR, PROTIME in the last 168  hours. Cardiac Enzymes: No results for input(s): CKTOTAL, CKMB, CKMBINDEX, TROPONINI in the last 168 hours. BNP (last 3 results) No results for input(s): PROBNP in the last 8760 hours. HbA1C: No results for input(s): HGBA1C in the last 72 hours.  CBG: Recent Labs  Lab 02/08/21 1735 02/08/21 2210 02/09/21 0739 02/09/21 1228 02/09/21 1619  GLUCAP 172* 193* 225* 204* 209*    Lipid Profile: No results for input(s): CHOL, HDL, LDLCALC, TRIG, CHOLHDL, LDLDIRECT in the last 72 hours. Thyroid Function Tests: No results for input(s): TSH, T4TOTAL, FREET4, T3FREE, THYROIDAB in the last 72 hours. Anemia Panel: No results for input(s): VITAMINB12, FOLATE, FERRITIN, TIBC, IRON, RETICCTPCT in the last 72 hours. Urine analysis: No results found for: COLORURINE, APPEARANCEUR, LABSPEC, PHURINE, GLUCOSEU, HGBUR, BILIRUBINUR, KETONESUR, PROTEINUR, UROBILINOGEN, NITRITE, LEUKOCYTESUR Sepsis Labs: @LABRCNTIP (procalcitonin:4,lacticidven:4)  ) Recent Results (from the past 240 hour(s))  SARS CORONAVIRUS 2 (TAT 6-24 HRS) Nasopharyngeal Nasopharyngeal Swab     Status: None   Collection Time: 02/05/21  7:18 PM   Specimen: Nasopharyngeal Swab  Result Value Ref Range Status   SARS Coronavirus 2 NEGATIVE NEGATIVE Final  Comment: (NOTE) SARS-CoV-2 target nucleic acids are NOT DETECTED.  The SARS-CoV-2 RNA is generally detectable in upper and lower respiratory specimens during the acute phase of infection. Negative results do not preclude SARS-CoV-2 infection, do not rule out co-infections with other pathogens, and should not be used as the sole basis for treatment or other patient management decisions. Negative results must be combined with clinical observations, patient history, and epidemiological information. The expected result is Negative.  Fact Sheet for Patients: SugarRoll.be  Fact Sheet for Healthcare  Providers: https://www.woods-mathews.com/  This test is not yet approved or cleared by the [place] FDA and  has been authorized for detection and/or diagnosis of SARS-CoV-2 by FDA under an Emergency Use Authorization (EUA). This EUA will remain  in effect (meaning this test can be used) for the duration of the COVID-19 declaration under Se ction 564(b)(1) of the Act, 21 U.S.C. section 360bbb-3(b)(1), unless the authorization is terminated or revoked sooner.  Performed at [facility], [place] [address], [place], [place] [address]        Studies: No results found.  Scheduled Meds:  alteplase  2 mg Intracatheter Once   amLODipine  5 mg Oral BID   aspirin EC  81 mg Oral q AM   atorvastatin  40 mg Oral QHS   calcitRIOL  0.25 mcg Oral Q breakfast   carvedilol  12.5 mg Oral BID WC   [START ON 02/10/2021] Chlorhexidine Gluconate Cloth  6 each Topical Q0600   cholecalciferol  2,000 Units Oral QPM   darbepoetin (ARANESP) injection - DIALYSIS  60 mcg Intravenous Q Mon-HD   dicyclomine  10 mg Oral BID   ferrous sulfate  325 mg Oral Daily   fluticasone furoate-vilanterol  1 puff Inhalation Daily   And   umeclidinium bromide  1 puff Inhalation Daily   furosemide  80 mg Oral BID   gabapentin  300 mg Oral QHS   gemfibrozil  600 mg Oral BID AC   heparin  5,000 Units Subcutaneous Q12H   insulin aspart  0-9 Units Subcutaneous TID WC   levothyroxine  50 mcg Oral Q0600   linagliptin  5 mg Oral Daily   montelukast  10 mg Oral QHS   pantoprazole  40 mg Oral Daily   sevelamer carbonate  800 mg Oral TID WC   sodium bicarbonate  650 mg Oral BID   sodium chloride flush  10-40 mL Intracatheter Q12H   vitamin A  10,000 Units Oral QPM   vitamin B-12  2,500 mcg Oral Daily    Continuous Infusions:  sodium chloride     sodium chloride       LOS: 4 days     Cristal Deer, MD [facility]  To reach me or the doctor on call, go to: [email or web address] Password  [facility]  02/09/2021, 7:35 PM

## 2021-02-10 DIAGNOSIS — D631 Anemia in chronic kidney disease: Secondary | ICD-10-CM

## 2021-02-10 DIAGNOSIS — N186 End stage renal disease: Secondary | ICD-10-CM

## 2021-02-10 LAB — GLUCOSE, CAPILLARY
Glucose-Capillary: 190 mg/dL — ABNORMAL HIGH (ref 70–99)
Glucose-Capillary: 280 mg/dL — ABNORMAL HIGH (ref 70–99)
Glucose-Capillary: 185 mg/dL — ABNORMAL HIGH (ref 70–99)

## 2021-02-10 LAB — HEPATITIS B SURFACE ANTIBODY, QUANTITATIVE: Hep B S AB Quant (Post): <3.1 m[IU]/mL — ABNORMAL LOW (ref >9.9)

## 2021-02-10 MED ORDER — CARVEDILOL 25 MG PO TABS: 12.5000 mg | ORAL_TABLET | Freq: Two times a day (BID) | ORAL | Status: AC

## 2021-02-10 NOTE — Progress Notes (Signed)
HepB surface antibody result faxed to [facility] to complete referral and message left for Admissions Coordinator to please call Renal Navigator when it is received.   Alphonzo Cruise, [place] Renal Navigator [phone]

## 2021-02-10 NOTE — Progress Notes (Signed)
Patient has been accepted at [facility] on a TTS schedle with a seat time of 11:45am. She needs to arrive to her appointments at 11:30am and is able to start tomorrow, Thursday, 02/11/21 if discharged today. Navigator understands that she is medically ready for discharge and has not been taken for HD yet today. Her last treatment here was Monday, 02/08/21. Navigator called Dr. Webb/Nephrologist to see if he agrees that patient can discharge today without HD and start at her outpatient clinic tomorrow, meaning this week her treatments would be M, Th, S. He agrees.  Navigator met with patient who is ecstatic with this plan and states appreciation.  Navigator updated Attending/Dr. Roderic Palau and [facility] to confirm that patient will start on 6/23. Patient confirms that her husband can pick her up today at discharge and will be transporting her to outpatient HD. Schedule letter provided to patient. Inpt HD unit notified that patient will not have HD here today. Bedside RN also updated.   Alphonzo Cruise,  Renal Navigator [phone]

## 2021-02-10 NOTE — Progress Notes (Signed)
[facility] ROUNDING NOTE   Subjective:   Interval History: This is a 65 year old lady with a history of chronic disease stage IV not on hemodialysis on admission.  Insulin-dependent diabetes hyperlipidemia hypertension presented with syncope with hypoglycemia.  She was advised to start dialysis due to moderate pericardial effusion.  Blood pressure 137/58 pulse 80 temperature 98.5 O2 sats 94% room  Underwent dialysis 02/08/2021 with 500 cc removed.  Next dialysis to be 02/10/2021    Objective:  Vital signs in last 24 hours:  Temp:  [98.1 F (36.7 C)-98.7 F (37.1 C)] 98.5 F (36.9 C) (06/22 0637) Pulse Rate:  [80-83] 80 (06/22 0637) Resp:  [16-17] 16 (06/22 0637) BP: (137-153)/(58-75) 137/58 (06/22 0637) SpO2:  [94 %-95 %] 94 % (06/22 0756)  Weight change: 0.5 kg Filed Weights   02/08/21 1243 02/08/21 2209 02/09/21 1013  Weight: 88 kg 89 kg 89 kg    Intake/Output: I/O last 3 completed shifts: In: 1440 [P.O.:1440] Out: 0    Intake/Output this shift:  No intake/output data recorded.  GEN NAD, sitting I bed HEENT EOMI PERRL NECK R IJ powerpicc (not TDC for dialysis) PULM clear CV RRR, no rub ABD soft EXT trace LE edema NEURO AAO x 3 no asterixis SKIN no rashes/ lesions ACCESS: L AVF + T/B   Basic Metabolic Panel: Recent Labs  Lab 02/06/21 0330 02/08/21 0908  NA 139 141  K 4.2 4.0  CL 109 107  CO2 22 25  GLUCOSE 192* 223*  BUN 54* 54*  CREATININE 5.36* 5.44*  CALCIUM 8.8* 8.9  PHOS  --  5.1*     Liver Function Tests: Recent Labs  Lab 02/08/21 0908  ALBUMIN 2.7*    No results for input(s): LIPASE, AMYLASE in the last 168 hours. No results for input(s): AMMONIA in the last 168 hours.  CBC: Recent Labs  Lab 02/06/21 0330 02/08/21 0908  WBC 6.5 6.0  HGB 7.2* 7.8*  HCT 23.1* 24.0*  MCV 89.2 87.6  PLT 165 154     Cardiac Enzymes: No results for input(s): CKTOTAL, CKMB, CKMBINDEX, TROPONINI in the last 168  hours.  BNP: Invalid input(s): POCBNP  CBG: Recent Labs  Lab 02/08/21 2210 02/09/21 0739 02/09/21 1228 02/09/21 1619 02/10/21 0636  GLUCAP 193* 225* 204* 209* 190*     Microbiology: Results for orders placed or performed during the hospital encounter of 02/05/21  SARS CORONAVIRUS 2 (TAT 6-24 HRS) Nasopharyngeal Nasopharyngeal Swab     Status: None   Collection Time: 02/05/21  7:18 PM   Specimen: Nasopharyngeal Swab  Result Value Ref Range Status   SARS Coronavirus 2 NEGATIVE NEGATIVE Final    Comment: (NOTE) SARS-CoV-2 target nucleic acids are NOT DETECTED.  The SARS-CoV-2 RNA is generally detectable in upper and lower respiratory specimens during the acute phase of infection. Negative results do not preclude SARS-CoV-2 infection, do not rule out co-infections with other pathogens, and should not be used as the sole basis for treatment or other patient management decisions. Negative results must be combined with clinical observations, patient history, and epidemiological information. The expected result is Negative.  Fact Sheet for Patients: SugarRoll.be  Fact Sheet for Healthcare Providers: https://www.woods-mathews.com/  This test is not yet approved or cleared by the [place] FDA and  has been authorized for detection and/or diagnosis of SARS-CoV-2 by FDA under an Emergency Use Authorization (EUA). This EUA will remain  in effect (meaning this test can be used) for the duration of the COVID-19 declaration under  Se ction 564(b)(1) of the Act, 21 U.S.C. section 360bbb-3(b)(1), unless the authorization is terminated or revoked sooner.  Performed at [facility], [place] [address] [place], [place] [address]     Coagulation Studies: No results for input(s): LABPROT, INR in the last 72 hours.  Urinalysis: No results for input(s): COLORURINE, LABSPEC, PHURINE, GLUCOSEU, HGBUR, BILIRUBINUR, KETONESUR, PROTEINUR,  UROBILINOGEN, NITRITE, LEUKOCYTESUR in the last 72 hours.  Invalid input(s): APPERANCEUR    Imaging: No results found.   Medications:    sodium chloride     sodium chloride      alteplase  2 mg Intracatheter Once   amLODipine  5 mg Oral BID   aspirin EC  81 mg Oral q AM   atorvastatin  40 mg Oral QHS   calcitRIOL  0.25 mcg Oral Q breakfast   carvedilol  12.5 mg Oral BID WC   Chlorhexidine Gluconate Cloth  6 each Topical Q0600   cholecalciferol  2,000 Units Oral QPM   darbepoetin (ARANESP) injection - DIALYSIS  60 mcg Intravenous Q Mon-HD   dicyclomine  10 mg Oral BID   ferrous sulfate  325 mg Oral Daily   fluticasone furoate-vilanterol  1 puff Inhalation Daily   And   umeclidinium bromide  1 puff Inhalation Daily   furosemide  80 mg Oral BID   gabapentin  300 mg Oral QHS   gemfibrozil  600 mg Oral BID AC   heparin  5,000 Units Subcutaneous Q12H   insulin aspart  0-9 Units Subcutaneous TID WC   levothyroxine  50 mcg Oral Q0600   linagliptin  5 mg Oral Daily   montelukast  10 mg Oral QHS   pantoprazole  40 mg Oral Daily   sevelamer carbonate  800 mg Oral TID WC   sodium bicarbonate  650 mg Oral BID   sodium chloride flush  10-40 mL Intracatheter Q12H   vitamin A  10,000 Units Oral QPM   vitamin B-12  2,500 mcg Oral Daily   sodium chloride, sodium chloride, acetaminophen, albuterol, clonazePAM, lidocaine (PF), lidocaine-prilocaine, ondansetron, oxyCODONE-acetaminophen, pentafluoroprop-tetrafluoroeth, sodium chloride flush, triamcinolone cream  Assessment/ Plan:  CKD 5 new start hemodialysis first dialysis treatment 02/06/2021 follow-up with Dr. Theador Hawthorne in [place] wants to continue to follow with him on discharge.  Also wants to continue to do peritoneal dialysis at some point.  Next dialysis 02/10/2021 Pericardial effusion hopefully will resolve with dialysis Anemia continue ESA 60 mcg q. Monday Metabolic bone disease PTH 178 Patient for clip process and  discharge    LOS: [place] @TODAY @11 :13 AM

## 2021-02-10 NOTE — Progress Notes (Signed)
DISCHARGE NOTE HOME Cindy Park to be discharged Home per MD order. Discussed prescriptions and follow up appointments with the patient. Prescriptions given to patient; medication list explained in detail. Patient verbalized understanding.  Skin clean, dry and intact without evidence of skin break down, no evidence of skin tears noted. IV catheter discontinued intact. Site without signs and symptoms of complications. Dressing and pressure applied. Pt denies pain at the site currently. No complaints noted.  Patient free of lines, drains, and wounds.   An After Visit Summary (AVS) was printed and given to the patient. Patient escorted via wheelchair, and discharged home via private auto.  [place], Zenon Mayo, RN

## 2021-02-10 NOTE — Plan of Care (Signed)
  Problem: Clinical Measurements: Goal: Dialysis access will remain free of complications Outcome: Progressing   

## 2021-02-10 NOTE — Progress Notes (Signed)
Inpatient Diabetes Program Recommendations  AACE/ADA: New Consensus Statement on Inpatient Glycemic Control   Target Ranges:  Prepandial:   less than 140 mg/dL      Peak postprandial:   less than 180 mg/dL (1-2 hours)      Critically ill patients:  140 - 180 mg/dL   Results for Cindy Park, Cindy Park (MRN [id number]) as of 02/10/2021 12:16  Ref. Range 02/09/2021 07:39 02/09/2021 12:28 02/09/2021 16:19 02/10/2021 06:36 02/10/2021 11:48  Glucose-Capillary Latest Ref Range: 70 - 99 mg/dL 225 (H) 204 (H) 209 (H) 190 (H) 280 (H)    Review of Glycemic Control  Diabetes history: DM2 Outpatient Diabetes medications: Humalog 75/25 60 units with breakfast, Humalog 75/25 15 units with lunch, Humalog 75/25 60 units with supper, Januvia 25 mg daily Current orders for Inpatient glycemic control: Tradjenta 5 mg daily, Novolog 0-9 units TID with meals  Inpatient Diabetes Program Recommendations:    Insulin: Please consider ordering Lantus 5 units Q24H and  Novolog 3 units TID with meals for meal coverage if patient eats at least 50% of meals.   Thanks, Barnie Alderman, RN, MSN, CDE Diabetes Coordinator Inpatient Diabetes Program [phone] (Team Pager from 8am to 5pm)

## 2021-02-10 NOTE — Discharge Summary (Signed)
Physician Discharge Summary  KASSY MCENROE CXK:[id number] DOB: [birth year] DOA: 02/05/2021  PCP: Monico Blitz, MD  Admit date: 02/05/2021 Discharge date: 02/10/2021  Admitted From: Home Disposition: Home  Recommendations for Outpatient Follow-up:  Follow up with PCP in 1-2 weeks Please obtain BMP/CBC in one week Follow-up at [facility] tomorrow at 11:30 AM for dialysis  Home Health: Equipment/Devices:  Discharge Condition: Stable CODE STATUS: Full code Diet recommendation: Heart healthy, carb modified  Brief/Interim Summary: 65 year old female with a history of chronic kidney disease, diabetes, hypertension, was admitted to the hospital with hypoglycemia.  She was noted to have a pericardial effusion on recent echocardiogram.  Nephrology was consulted with recommendations to transfer to [facility] to initiate hemodialysis  Discharge Diagnoses:  Active Problems:   Hypoglycemia due to insulin   Hypoglycemia   Pericardial effusion  Hypoglycemia, likely related to insulin dosing in the setting of chronic kidney disease -Blood sugars have since stabilized  Chronic kidney disease stage V with progression to end-stage renal disease -Started on hemodialysis in the hospital -Outpatient arrangements have been made for her to follow-up at [facility], with her first outpatient session being tomorrow.   -Regular schedule will be dialysis TTS -Follow-up with Dr. Theador Hawthorne  Hypertension -Controlled on amlodipine and carvedilol  Anemia of chronic disease, secondary to renal disease -Received Retacrit in the hospital -Continue to follow as an outpatient  Pericardial effusion -No evidence of tamponade on recent echo -This should hopefully improve with ongoing dialysis -Repeat echo in the next few weeks to ensure improvement  Hyperlipidemia -Continue statins  Discharge Instructions  Discharge Instructions     Diet - low sodium heart healthy   Complete by: As directed     Increase activity slowly   Complete by: As directed    No wound care   Complete by: As directed       Allergies as of 02/10/2021       Reactions   Latex Shortness Of Breath, Rash   With powder    Niacin Other (See Comments)   Burning   Other Dermatitis   Powder in the gloves    Tomato Other (See Comments)   Breaks pt out    Bactrim [sulfamethoxazole-trimethoprim] Swelling, Rash        Medication List     STOP taking these medications    oxyCODONE-acetaminophen 5-325 MG tablet Commonly known as: Percocet       TAKE these medications    acetaminophen 500 MG tablet Commonly known as: TYLENOL Take 1,000 mg by mouth every 6 (six) hours as needed for moderate pain.   albuterol (2.5 MG/3ML) 0.083% nebulizer solution Commonly known as: PROVENTIL Take 2.5 mg by nebulization 4 (four) times daily.   albuterol 108 (90 Base) MCG/ACT inhaler Commonly known as: VENTOLIN HFA Inhale 2 puffs into the lungs every 6 (six) hours as needed for wheezing or shortness of breath.   amLODipine 5 MG tablet Commonly known as: NORVASC Take 5 mg by mouth 2 (two) times daily.   aspirin EC 81 MG tablet Take 81 mg by mouth in the morning.   atorvastatin 40 MG tablet Commonly known as: LIPITOR Take 40 mg by mouth at bedtime.   calcitRIOL 0.5 MCG capsule Commonly known as: ROCALTROL Take 0.5 mcg by mouth in the morning.   carvedilol 25 MG tablet Commonly known as: COREG Take 0.5 tablets (12.5 mg total) by mouth 2 (two) times daily with a meal. What changed: how much to take  clonazePAM 0.5 MG tablet Commonly known as: KLONOPIN Take 0.5 mg by mouth 2 (two) times daily as needed for anxiety.   dicyclomine 10 MG capsule Commonly known as: BENTYL Take 10 mg by mouth 2 (two) times daily.   docusate sodium 100 MG capsule Commonly known as: COLACE Take 200 mg by mouth daily.   epoetin alfa 3000 UNIT/ML injection Commonly known as: EPOGEN Inject 3,000 Units into the skin every  14 (fourteen) days.   ferrous sulfate 325 (65 FE) MG tablet Take 325 mg by mouth 2 (two) times daily with a meal.   furosemide 80 MG tablet Commonly known as: LASIX Take 80 mg by mouth 2 (two) times daily.   gabapentin 300 MG capsule Commonly known as: NEURONTIN Take 300 mg by mouth 2 (two) times daily.   gemfibrozil 600 MG tablet Commonly known as: LOPID Take 600 mg by mouth in the morning and at bedtime.   HUMALOG MIX 75/25 [place] Inject 15-60 Units into the skin See admin instructions. Inject 60 units into the skin in the morning and 15 units at lunch and 60 units at supper   levothyroxine 50 MCG tablet Commonly known as: SYNTHROID Take 50 mcg by mouth daily before breakfast.   montelukast 10 MG tablet Commonly known as: SINGULAIR Take 10 mg by mouth at bedtime.   omeprazole 20 MG capsule Commonly known as: PRILOSEC Take 20 mg by mouth every evening.   ondansetron 4 MG tablet Commonly known as: ZOFRAN Take 4 mg by mouth every 8 (eight) hours as needed for nausea or vomiting.   Retacrit 10000 UNIT/ML injection Generic drug: epoetin alfa-epbx 6,000 Units every 14 (fourteen) days.   sevelamer carbonate 800 MG tablet Commonly known as: RENVELA Take 800 mg by mouth 3 (three) times daily with meals.   sitaGLIPtin 25 MG tablet Commonly known as: JANUVIA Take 25 mg by mouth in the morning.   sodium bicarbonate 650 MG tablet Take 650 mg by mouth 2 (two) times daily.   Trelegy Ellipta 100-62.5-25 MCG/INH Aepb Generic drug: Fluticasone-Umeclidin-Vilant Inhale 1 puff into the lungs daily.   triamcinolone cream 0.1 % Commonly known as: KENALOG Apply 1 application topically daily as needed (skin irritation). To feet   Vitamin A 2400 MCG (8000 UT) Tabs Take 2,400 mcg by mouth every evening.   vitamin B-12 500 MCG tablet Commonly known as: CYANOCOBALAMIN Take 500 mcg by mouth in the morning.   Vitamin D3 50 MCG (2000 UT) Tabs Take 2,000 Units by mouth every  evening.   vitamin E 180 MG (400 UNITS) capsule Take 400 Units by mouth daily.        Allergies  Allergen Reactions   Latex Shortness Of Breath and Rash    With powder    Niacin Other (See Comments)    Burning   Other Dermatitis    Powder in the gloves    Tomato Other (See Comments)    Breaks pt out    Bactrim [Sulfamethoxazole-Trimethoprim] Swelling and Rash    Consultations: Nephrology   Procedures/Studies: DG CHEST PORT 1 VIEW  Result Date: 02/06/2021 CLINICAL DATA:  Central line. EXAM: PORTABLE CHEST 1 VIEW COMPARISON:  Chest radiograph 02/03/2021. FINDINGS: Central venous catheter tip projects over the superior vena cava. Monitoring leads overlie the patient. Stable cardiomegaly. Heterogeneous opacities lung bases may represent atelectasis. No pleural effusion or pneumothorax. Osseous structures unremarkable. IMPRESSION: Central venous catheter tip projects over the superior vena cava. Cardiomegaly. Atelectasis versus scarring. Electronically Signed   By: Dian Situ  Rosana Hoes M.D.   On: 02/06/2021 09:03      Subjective: Patient is feeling well today.  Denies any shortness of breath.  Able to ambulate without difficulty.  Discharge Exam: Vitals:   02/09/21 2010 02/10/21 0637 02/10/21 0756 02/10/21 1128  BP: (!) 153/75 (!) 137/58  (!) 146/74  Pulse: 83 80  80  Resp: 17 16  17   Temp: 98.1 F (36.7 C) 98.5 F (36.9 C)  (!) 97.4 F (36.3 C)  TempSrc: Oral Oral  Oral  SpO2: 95% 95% 94% 95%  Weight:      Height:        General: Pt is alert, awake, not in acute distress Cardiovascular: RRR, S1/S2 +, no rubs, no gallops Respiratory: CTA bilaterally, no wheezing, no rhonchi Abdominal: Soft, NT, ND, bowel sounds + Extremities: no edema, no cyanosis    The results of significant diagnostics from this hospitalization (including imaging, microbiology, ancillary and laboratory) are listed below for reference.     Microbiology: Recent Results (from the past 240 hour(s))   SARS CORONAVIRUS 2 (TAT 6-24 HRS) Nasopharyngeal Nasopharyngeal Swab     Status: None   Collection Time: 02/05/21  7:18 PM   Specimen: Nasopharyngeal Swab  Result Value Ref Range Status   SARS Coronavirus 2 NEGATIVE NEGATIVE Final    Comment: (NOTE) SARS-CoV-2 target nucleic acids are NOT DETECTED.  The SARS-CoV-2 RNA is generally detectable in upper and lower respiratory specimens during the acute phase of infection. Negative results do not preclude SARS-CoV-2 infection, do not rule out co-infections with other pathogens, and should not be used as the sole basis for treatment or other patient management decisions. Negative results must be combined with clinical observations, patient history, and epidemiological information. The expected result is Negative.  Fact Sheet for Patients: SugarRoll.be  Fact Sheet for Healthcare Providers: https://www.woods-mathews.com/  This test is not yet approved or cleared by the [place] FDA and  has been authorized for detection and/or diagnosis of SARS-CoV-2 by FDA under an Emergency Use Authorization (EUA). This EUA will remain  in effect (meaning this test can be used) for the duration of the COVID-19 declaration under Se ction 564(b)(1) of the Act, 21 U.S.C. section 360bbb-3(b)(1), unless the authorization is terminated or revoked sooner.  Performed at [facility], [place] [address] [place], [place] [address]      Labs: BNP (last 3 results) No results for input(s): BNP in the last 8760 hours. Basic Metabolic Panel: Recent Labs  Lab 02/06/21 0330 02/08/21 0908  NA 139 141  K 4.2 4.0  CL 109 107  CO2 22 25  GLUCOSE 192* 223*  BUN 54* 54*  CREATININE 5.36* 5.44*  CALCIUM 8.8* 8.9  PHOS  --  5.1*   Liver Function Tests: Recent Labs  Lab 02/08/21 0908  ALBUMIN 2.7*   No results for input(s): LIPASE, AMYLASE in the last 168 hours. No results for input(s): AMMONIA in the  last 168 hours. CBC: Recent Labs  Lab 02/06/21 0330 02/08/21 0908  WBC 6.5 6.0  HGB 7.2* 7.8*  HCT 23.1* 24.0*  MCV 89.2 87.6  PLT 165 154   Cardiac Enzymes: No results for input(s): CKTOTAL, CKMB, CKMBINDEX, TROPONINI in the last 168 hours. BNP: Invalid input(s): POCBNP CBG: Recent Labs  Lab 02/09/21 0739 02/09/21 1228 02/09/21 1619 02/10/21 0636 02/10/21 1148  GLUCAP 225* 204* 209* 190* 280*   D-Dimer No results for input(s): DDIMER in the last 72 hours. Hgb A1c No results for input(s): HGBA1C  in the last 72 hours. Lipid Profile No results for input(s): CHOL, HDL, LDLCALC, TRIG, CHOLHDL, LDLDIRECT in the last 72 hours. Thyroid function studies No results for input(s): TSH, T4TOTAL, T3FREE, THYROIDAB in the last 72 hours.  Invalid input(s): FREET3 Anemia work up No results for input(s): VITAMINB12, FOLATE, FERRITIN, TIBC, IRON, RETICCTPCT in the last 72 hours. Urinalysis No results found for: COLORURINE, APPEARANCEUR, [place], [place], GLUCOSEU, [place], [place], [place], PROTEINUR, UROBILINOGEN, NITRITE, LEUKOCYTESUR Sepsis Labs Invalid input(s): PROCALCITONIN,  WBC,  LACTICIDVEN Microbiology Recent Results (from the past 240 hour(s))  SARS CORONAVIRUS 2 (TAT 6-24 HRS) Nasopharyngeal Nasopharyngeal Swab     Status: None   Collection Time: 02/05/21  7:18 PM   Specimen: Nasopharyngeal Swab  Result Value Ref Range Status   SARS Coronavirus 2 NEGATIVE NEGATIVE Final    Comment: (NOTE) SARS-CoV-2 target nucleic acids are NOT DETECTED.  The SARS-CoV-2 RNA is generally detectable in upper and lower respiratory specimens during the acute phase of infection. Negative results do not preclude SARS-CoV-2 infection, do not rule out co-infections with other pathogens, and should not be used as the sole basis for treatment or other patient management decisions. Negative results must be combined with clinical observations, patient history, and epidemiological  information. The expected result is Negative.  Fact Sheet for Patients: SugarRoll.be  Fact Sheet for Healthcare Providers: https://www.woods-mathews.com/  This test is not yet approved or cleared by the [place] FDA and  has been authorized for detection and/or diagnosis of SARS-CoV-2 by FDA under an Emergency Use Authorization (EUA). This EUA will remain  in effect (meaning this test can be used) for the duration of the COVID-19 declaration under Se ction 564(b)(1) of the Act, 21 U.S.C. section 360bbb-3(b)(1), unless the authorization is terminated or revoked sooner.  Performed at [facility], [place] [address], [place], [place] [address]      Time coordinating discharge: 30mins  SIGNED:   Kathie Dike, MD  [facility] 02/10/2021, 2:16 PM   If 7PM-7AM, please contact night-coverage [email or web address]

## 2021-02-10 NOTE — Progress Notes (Signed)
Discharge Summary and Renal note faxed to [facility].  Alphonzo Cruise, [place] Renal Navigator [phone]

## 2021-02-11 DIAGNOSIS — E119 Type 2 diabetes mellitus without complications: Secondary | ICD-10-CM | POA: Diagnosis not present

## 2021-02-11 DIAGNOSIS — N186 End stage renal disease: Secondary | ICD-10-CM | POA: Diagnosis not present

## 2021-02-11 DIAGNOSIS — Z992 Dependence on renal dialysis: Secondary | ICD-10-CM | POA: Diagnosis not present

## 2021-02-11 DIAGNOSIS — Z794 Long term (current) use of insulin: Secondary | ICD-10-CM | POA: Diagnosis not present

## 2021-02-13 DIAGNOSIS — Z992 Dependence on renal dialysis: Secondary | ICD-10-CM | POA: Diagnosis not present

## 2021-02-13 DIAGNOSIS — N186 End stage renal disease: Secondary | ICD-10-CM | POA: Diagnosis not present

## 2021-02-16 DIAGNOSIS — D509 Iron deficiency anemia, unspecified: Secondary | ICD-10-CM | POA: Diagnosis not present

## 2021-02-16 DIAGNOSIS — Z992 Dependence on renal dialysis: Secondary | ICD-10-CM | POA: Diagnosis not present

## 2021-02-16 DIAGNOSIS — N186 End stage renal disease: Secondary | ICD-10-CM | POA: Diagnosis not present

## 2021-02-18 DIAGNOSIS — E039 Hypothyroidism, unspecified: Secondary | ICD-10-CM | POA: Diagnosis not present

## 2021-02-18 DIAGNOSIS — I10 Essential (primary) hypertension: Secondary | ICD-10-CM | POA: Diagnosis not present

## 2021-02-18 DIAGNOSIS — Z992 Dependence on renal dialysis: Secondary | ICD-10-CM | POA: Diagnosis not present

## 2021-02-18 DIAGNOSIS — E1129 Type 2 diabetes mellitus with other diabetic kidney complication: Secondary | ICD-10-CM | POA: Diagnosis not present

## 2021-02-18 DIAGNOSIS — N186 End stage renal disease: Secondary | ICD-10-CM | POA: Diagnosis not present

## 2021-02-18 DIAGNOSIS — N183 Chronic kidney disease, stage 3 unspecified: Secondary | ICD-10-CM | POA: Diagnosis not present

## 2021-02-19 DIAGNOSIS — I5032 Chronic diastolic (congestive) heart failure: Secondary | ICD-10-CM | POA: Diagnosis not present

## 2021-02-19 DIAGNOSIS — I313 Pericardial effusion (noninflammatory): Secondary | ICD-10-CM | POA: Diagnosis not present

## 2021-02-19 DIAGNOSIS — Z992 Dependence on renal dialysis: Secondary | ICD-10-CM | POA: Diagnosis not present

## 2021-02-19 DIAGNOSIS — N183 Chronic kidney disease, stage 3 unspecified: Secondary | ICD-10-CM | POA: Diagnosis not present

## 2021-02-19 DIAGNOSIS — Z299 Encounter for prophylactic measures, unspecified: Secondary | ICD-10-CM | POA: Diagnosis not present

## 2021-02-20 DIAGNOSIS — Z23 Encounter for immunization: Secondary | ICD-10-CM | POA: Diagnosis not present

## 2021-02-20 DIAGNOSIS — N186 End stage renal disease: Secondary | ICD-10-CM | POA: Diagnosis not present

## 2021-02-20 DIAGNOSIS — Z992 Dependence on renal dialysis: Secondary | ICD-10-CM | POA: Diagnosis not present

## 2021-02-23 DIAGNOSIS — Z992 Dependence on renal dialysis: Secondary | ICD-10-CM | POA: Diagnosis not present

## 2021-02-23 DIAGNOSIS — N186 End stage renal disease: Secondary | ICD-10-CM | POA: Diagnosis not present

## 2021-02-23 DIAGNOSIS — Z23 Encounter for immunization: Secondary | ICD-10-CM | POA: Diagnosis not present

## 2021-02-25 DIAGNOSIS — Z23 Encounter for immunization: Secondary | ICD-10-CM | POA: Diagnosis not present

## 2021-02-25 DIAGNOSIS — N186 End stage renal disease: Secondary | ICD-10-CM | POA: Diagnosis not present

## 2021-02-25 DIAGNOSIS — Z992 Dependence on renal dialysis: Secondary | ICD-10-CM | POA: Diagnosis not present

## 2021-02-27 DIAGNOSIS — N186 End stage renal disease: Secondary | ICD-10-CM | POA: Diagnosis not present

## 2021-02-27 DIAGNOSIS — Z23 Encounter for immunization: Secondary | ICD-10-CM | POA: Diagnosis not present

## 2021-02-27 DIAGNOSIS — Z992 Dependence on renal dialysis: Secondary | ICD-10-CM | POA: Diagnosis not present

## 2021-03-02 DIAGNOSIS — Z23 Encounter for immunization: Secondary | ICD-10-CM | POA: Diagnosis not present

## 2021-03-02 DIAGNOSIS — N186 End stage renal disease: Secondary | ICD-10-CM | POA: Diagnosis not present

## 2021-03-02 DIAGNOSIS — Z992 Dependence on renal dialysis: Secondary | ICD-10-CM | POA: Diagnosis not present

## 2021-03-04 DIAGNOSIS — Z992 Dependence on renal dialysis: Secondary | ICD-10-CM | POA: Diagnosis not present

## 2021-03-04 DIAGNOSIS — N186 End stage renal disease: Secondary | ICD-10-CM | POA: Diagnosis not present

## 2021-03-04 DIAGNOSIS — Z23 Encounter for immunization: Secondary | ICD-10-CM | POA: Diagnosis not present

## 2021-03-06 DIAGNOSIS — Z23 Encounter for immunization: Secondary | ICD-10-CM | POA: Diagnosis not present

## 2021-03-06 DIAGNOSIS — Z992 Dependence on renal dialysis: Secondary | ICD-10-CM | POA: Diagnosis not present

## 2021-03-06 DIAGNOSIS — N186 End stage renal disease: Secondary | ICD-10-CM | POA: Diagnosis not present

## 2021-03-09 DIAGNOSIS — Z23 Encounter for immunization: Secondary | ICD-10-CM | POA: Diagnosis not present

## 2021-03-09 DIAGNOSIS — N186 End stage renal disease: Secondary | ICD-10-CM | POA: Diagnosis not present

## 2021-03-09 DIAGNOSIS — Z992 Dependence on renal dialysis: Secondary | ICD-10-CM | POA: Diagnosis not present

## 2021-03-11 DIAGNOSIS — Z23 Encounter for immunization: Secondary | ICD-10-CM | POA: Diagnosis not present

## 2021-03-11 DIAGNOSIS — N186 End stage renal disease: Secondary | ICD-10-CM | POA: Diagnosis not present

## 2021-03-11 DIAGNOSIS — Z992 Dependence on renal dialysis: Secondary | ICD-10-CM | POA: Diagnosis not present

## 2021-03-13 DIAGNOSIS — N186 End stage renal disease: Secondary | ICD-10-CM | POA: Diagnosis not present

## 2021-03-13 DIAGNOSIS — Z23 Encounter for immunization: Secondary | ICD-10-CM | POA: Diagnosis not present

## 2021-03-13 DIAGNOSIS — Z992 Dependence on renal dialysis: Secondary | ICD-10-CM | POA: Diagnosis not present

## 2021-03-16 DIAGNOSIS — D509 Iron deficiency anemia, unspecified: Secondary | ICD-10-CM | POA: Diagnosis not present

## 2021-03-16 DIAGNOSIS — N186 End stage renal disease: Secondary | ICD-10-CM | POA: Diagnosis not present

## 2021-03-16 DIAGNOSIS — Z992 Dependence on renal dialysis: Secondary | ICD-10-CM | POA: Diagnosis not present

## 2021-03-16 DIAGNOSIS — Z23 Encounter for immunization: Secondary | ICD-10-CM | POA: Diagnosis not present

## 2021-03-18 DIAGNOSIS — N186 End stage renal disease: Secondary | ICD-10-CM | POA: Diagnosis not present

## 2021-03-18 DIAGNOSIS — Z23 Encounter for immunization: Secondary | ICD-10-CM | POA: Diagnosis not present

## 2021-03-18 DIAGNOSIS — Z992 Dependence on renal dialysis: Secondary | ICD-10-CM | POA: Diagnosis not present

## 2021-03-19 ENCOUNTER — Ambulatory Visit: Payer: Medicare Other | Admitting: Gastroenterology

## 2021-03-19 DIAGNOSIS — I10 Essential (primary) hypertension: Secondary | ICD-10-CM | POA: Diagnosis not present

## 2021-03-20 DIAGNOSIS — Z23 Encounter for immunization: Secondary | ICD-10-CM | POA: Diagnosis not present

## 2021-03-20 DIAGNOSIS — N186 End stage renal disease: Secondary | ICD-10-CM | POA: Diagnosis not present

## 2021-03-20 DIAGNOSIS — Z992 Dependence on renal dialysis: Secondary | ICD-10-CM | POA: Diagnosis not present

## 2021-03-21 DIAGNOSIS — I10 Essential (primary) hypertension: Secondary | ICD-10-CM | POA: Diagnosis not present

## 2021-03-21 DIAGNOSIS — Z992 Dependence on renal dialysis: Secondary | ICD-10-CM | POA: Diagnosis not present

## 2021-03-21 DIAGNOSIS — J441 Chronic obstructive pulmonary disease with (acute) exacerbation: Secondary | ICD-10-CM | POA: Diagnosis not present

## 2021-03-21 DIAGNOSIS — N186 End stage renal disease: Secondary | ICD-10-CM | POA: Diagnosis not present

## 2021-03-21 DIAGNOSIS — E039 Hypothyroidism, unspecified: Secondary | ICD-10-CM | POA: Diagnosis not present

## 2021-03-21 DIAGNOSIS — E1129 Type 2 diabetes mellitus with other diabetic kidney complication: Secondary | ICD-10-CM | POA: Diagnosis not present

## 2021-03-21 DIAGNOSIS — N183 Chronic kidney disease, stage 3 unspecified: Secondary | ICD-10-CM | POA: Diagnosis not present

## 2021-03-23 ENCOUNTER — Ambulatory Visit: Payer: Medicare Other | Admitting: Gastroenterology

## 2021-03-23 ENCOUNTER — Encounter: Payer: Self-pay | Admitting: Gastroenterology

## 2021-03-23 DIAGNOSIS — Z23 Encounter for immunization: Secondary | ICD-10-CM | POA: Diagnosis not present

## 2021-03-23 DIAGNOSIS — N186 End stage renal disease: Secondary | ICD-10-CM | POA: Diagnosis not present

## 2021-03-23 DIAGNOSIS — Z992 Dependence on renal dialysis: Secondary | ICD-10-CM | POA: Diagnosis not present

## 2021-03-25 DIAGNOSIS — Z23 Encounter for immunization: Secondary | ICD-10-CM | POA: Diagnosis not present

## 2021-03-25 DIAGNOSIS — N186 End stage renal disease: Secondary | ICD-10-CM | POA: Diagnosis not present

## 2021-03-25 DIAGNOSIS — Z992 Dependence on renal dialysis: Secondary | ICD-10-CM | POA: Diagnosis not present

## 2021-03-27 DIAGNOSIS — Z992 Dependence on renal dialysis: Secondary | ICD-10-CM | POA: Diagnosis not present

## 2021-03-27 DIAGNOSIS — N186 End stage renal disease: Secondary | ICD-10-CM | POA: Diagnosis not present

## 2021-03-27 DIAGNOSIS — Z23 Encounter for immunization: Secondary | ICD-10-CM | POA: Diagnosis not present

## 2021-03-30 DIAGNOSIS — N186 End stage renal disease: Secondary | ICD-10-CM | POA: Diagnosis not present

## 2021-03-30 DIAGNOSIS — Z23 Encounter for immunization: Secondary | ICD-10-CM | POA: Diagnosis not present

## 2021-03-30 DIAGNOSIS — Z992 Dependence on renal dialysis: Secondary | ICD-10-CM | POA: Diagnosis not present

## 2021-04-01 DIAGNOSIS — N186 End stage renal disease: Secondary | ICD-10-CM | POA: Diagnosis not present

## 2021-04-01 DIAGNOSIS — Z992 Dependence on renal dialysis: Secondary | ICD-10-CM | POA: Diagnosis not present

## 2021-04-01 DIAGNOSIS — Z23 Encounter for immunization: Secondary | ICD-10-CM | POA: Diagnosis not present

## 2021-04-02 ENCOUNTER — Encounter (HOSPITAL_COMMUNITY): Payer: Self-pay | Admitting: Emergency Medicine

## 2021-04-02 ENCOUNTER — Emergency Department (HOSPITAL_BASED_OUTPATIENT_CLINIC_OR_DEPARTMENT_OTHER)
Admit: 2021-04-02 | Discharge: 2021-04-02 | Disposition: A | Discharge status: Home / Self Care | Payer: Medicare Other | Attending: Emergency Medicine | Admitting: Emergency Medicine

## 2021-04-02 ENCOUNTER — Emergency Department (HOSPITAL_COMMUNITY)
Admission: EM | Admit: 2021-04-02 | Discharge: 2021-04-02 | Disposition: A | Discharge status: Home / Self Care | Payer: Medicare Other | Attending: Emergency Medicine | Admitting: Emergency Medicine

## 2021-04-02 ENCOUNTER — Other Ambulatory Visit: Payer: Self-pay

## 2021-04-02 DIAGNOSIS — Z7952 Long term (current) use of systemic steroids: Secondary | ICD-10-CM | POA: Diagnosis not present

## 2021-04-02 DIAGNOSIS — I129 Hypertensive chronic kidney disease with stage 1 through stage 4 chronic kidney disease, or unspecified chronic kidney disease: Secondary | ICD-10-CM | POA: Diagnosis not present

## 2021-04-02 DIAGNOSIS — Z79899 Other long term (current) drug therapy: Secondary | ICD-10-CM | POA: Diagnosis not present

## 2021-04-02 DIAGNOSIS — J449 Chronic obstructive pulmonary disease, unspecified: Secondary | ICD-10-CM | POA: Diagnosis not present

## 2021-04-02 DIAGNOSIS — J45909 Unspecified asthma, uncomplicated: Secondary | ICD-10-CM | POA: Diagnosis not present

## 2021-04-02 DIAGNOSIS — N1832 Chronic kidney disease, stage 3b: Secondary | ICD-10-CM | POA: Insufficient documentation

## 2021-04-02 DIAGNOSIS — Z20822 Contact with and (suspected) exposure to covid-19: Secondary | ICD-10-CM | POA: Insufficient documentation

## 2021-04-02 DIAGNOSIS — Z91040 Latex allergy status: Secondary | ICD-10-CM | POA: Diagnosis not present

## 2021-04-02 DIAGNOSIS — Y840 Cardiac catheterization as the cause of abnormal reaction of the patient, or of later complication, without mention of misadventure at the time of the procedure: Secondary | ICD-10-CM | POA: Diagnosis not present

## 2021-04-02 DIAGNOSIS — I10 Essential (primary) hypertension: Secondary | ICD-10-CM | POA: Diagnosis not present

## 2021-04-02 DIAGNOSIS — Z794 Long term (current) use of insulin: Secondary | ICD-10-CM | POA: Insufficient documentation

## 2021-04-02 DIAGNOSIS — T82590A Other mechanical complication of surgically created arteriovenous fistula, initial encounter: Secondary | ICD-10-CM | POA: Insufficient documentation

## 2021-04-02 DIAGNOSIS — E1122 Type 2 diabetes mellitus with diabetic chronic kidney disease: Secondary | ICD-10-CM | POA: Insufficient documentation

## 2021-04-02 DIAGNOSIS — R0689 Other abnormalities of breathing: Secondary | ICD-10-CM | POA: Diagnosis not present

## 2021-04-02 DIAGNOSIS — E1140 Type 2 diabetes mellitus with diabetic neuropathy, unspecified: Secondary | ICD-10-CM | POA: Diagnosis not present

## 2021-04-02 DIAGNOSIS — Z7982 Long term (current) use of aspirin: Secondary | ICD-10-CM | POA: Insufficient documentation

## 2021-04-02 DIAGNOSIS — E11319 Type 2 diabetes mellitus with unspecified diabetic retinopathy without macular edema: Secondary | ICD-10-CM | POA: Insufficient documentation

## 2021-04-02 DIAGNOSIS — T82898A Other specified complication of vascular prosthetic devices, implants and grafts, initial encounter: Secondary | ICD-10-CM

## 2021-04-02 LAB — BASIC METABOLIC PANEL
Anion gap: 14 (ref 5–15)
BUN: 51 mg/dL — ABNORMAL HIGH (ref 8–23)
CO2: 24 mmol/L (ref 22–32)
Calcium: 8.7 mg/dL — ABNORMAL LOW (ref 8.9–10.3)
Chloride: 101 mmol/L (ref 98–111)
Creatinine, Ser: 7.25 mg/dL — ABNORMAL HIGH (ref 0.44–1.00)
GFR, Estimated: 6 mL/min — ABNORMAL LOW (ref >60)
Glucose, Bld: 92 mg/dL (ref 70–99)
Potassium: 4.2 mmol/L (ref 3.5–5.1)
Sodium: 139 mmol/L (ref 135–145)

## 2021-04-02 LAB — CBC WITH DIFFERENTIAL/PLATELET
Abs Immature Granulocytes: 0.03 10*3/uL (ref 0.00–0.07)
Basophils Absolute: 0 10*3/uL (ref 0.0–0.1)
Basophils Relative: 1 %
Eosinophils Absolute: 0.3 10*3/uL (ref 0.0–0.5)
Eosinophils Relative: 5 %
HCT: 28.4 % — ABNORMAL LOW (ref 36.0–46.0)
Hemoglobin: 9 g/dL — ABNORMAL LOW (ref 12.0–15.0)
Immature Granulocytes: 1 %
Lymphocytes Relative: 28 %
Lymphs Abs: 1.7 10*3/uL (ref 0.7–4.0)
MCH: 28.7 pg (ref 26.0–34.0)
MCHC: 31.7 g/dL (ref 30.0–36.0)
MCV: 90.4 fL (ref 80.0–100.0)
Monocytes Absolute: 0.4 10*3/uL (ref 0.1–1.0)
Monocytes Relative: 7 %
Neutro Abs: 3.7 10*3/uL (ref 1.7–7.7)
Neutrophils Relative %: 58 %
Platelets: 284 10*3/uL (ref 150–400)
RBC: 3.14 MIL/uL — ABNORMAL LOW (ref 3.87–5.11)
RDW: 14.4 % (ref 11.5–15.5)
WBC: 6.2 10*3/uL (ref 4.0–10.5)
nRBC: 0 % (ref 0.0–0.2)

## 2021-04-02 LAB — RESP PANEL BY RT-PCR (FLU A&B, COVID) ARPGX2
Influenza A by PCR: NEGATIVE
Influenza B by PCR: NEGATIVE
SARS Coronavirus 2 by RT PCR: NEGATIVE

## 2021-04-02 NOTE — ED Notes (Signed)
Pt d/c home per MD order. Discharge summary reviewed with pt, pt verbalizes understanding. No s/s of acute distress noted at discharge. Ambulatory off unit. Discharged home with husband.

## 2021-04-02 NOTE — ED Triage Notes (Signed)
Pt here from home with a swollen left upper arm that she uses for dialysis , warm to touch , was unable to get dialysis yesterday , sent over for possible clot

## 2021-04-02 NOTE — Progress Notes (Signed)
Left upper extremity AVF surveillance has been completed. Preliminary results can be found in CV Proc through chart review.  Results were given to Dr. Melina Copa.  04/02/21 7:12 PM Cindy Park RVT

## 2021-04-02 NOTE — ED Provider Notes (Signed)
[facility] EMERGENCY DEPARTMENT Provider Note   CSN: [id number] Arrival date & time: 04/02/21  1719     History No chief complaint on file.   Cindy Park is a 65 y.o. female.  She has a history of end-stage renal disease and has a fistula in her left upper arm that was placed in October 2021.  Fistula was operative 3 days ago.  They were unable to access her yesterday and today she had some swelling and some discomfort there.  Concern for thrombosed fistula.  She denies other other active complaints right now.  The history is provided by the patient.  Arm Injury Location:  Arm Arm location:  L upper arm Injury: yes   Time since incident:  1 day Mechanism of injury comment:  Dialysis puncture Pain details:    Quality:  Aching   Radiates to:  Does not radiate   Severity:  Mild   Onset quality:  Gradual   Progression:  Unchanged Relieved by:  None tried Worsened by:  Nothing Ineffective treatments:  None tried Associated symptoms: swelling   Associated symptoms: no back pain, no fever, no neck pain, no numbness and no tingling       Past Medical History:  Diagnosis Date   Anemia    Arthritis    Asthma    Cataract    Combined form OU   COPD (chronic obstructive pulmonary disease) ([place])    Diabetes mellitus without complication ([place])    Diabetic retinopathy ([place])    BDR OU   Family history of adverse reaction to anesthesia    sister- difficult to wake up from anesthesia   Fluid excess    GERD (gastroesophageal reflux disease)    Goiter    Hypertension    Hypertensive retinopathy    OU   Renal failure     Patient Active Problem List   Diagnosis Date Noted   Hypoglycemia due to insulin 02/05/2021   Hypoglycemia 02/05/2021   Pericardial effusion 02/05/2021   Chronic kidney disease 08/11/2020   Hereditary and idiopathic neuropathy, unspecified 08/11/2020   Hyperglycemia due to type 2 diabetes mellitus ([place]) 08/11/2020   Non-toxic goiter  08/11/2020   Pure hypercholesterolemia 08/11/2020   Secondary hyperparathyroidism ([place]) 08/11/2020   IDA (iron deficiency anemia) 01/24/2020   Constipation 01/24/2020   GERD (gastroesophageal reflux disease)    Stage 3b chronic kidney disease ([place]) 07/12/2019   Type 2 diabetes mellitus with diabetic chronic kidney disease ([place]) 07/12/2019   MIXED HYPERLIPIDEMIA 01/21/2010   ESSENTIAL HYPERTENSION, BENIGN 01/21/2010   Asthma 01/21/2010   CHEST PAIN 01/21/2010    Past Surgical History:  Procedure Laterality Date   ABDOMINAL HYSTERECTOMY     AV FISTULA PLACEMENT Left 06/11/2020   Procedure: LEFT ARM ARTERIOVENOUS (AV) FISTULA CREATION;  Surgeon: [name], MD;  Location: [facility];  Service: Vascular;  Laterality: Left;   BASCILIC VEIN TRANSPOSITION Left 08/06/2020   Procedure: LEFT ARM SECOND STAGE [place];  Surgeon: [name], MD;  Location: [facility];  Service: Vascular;  Laterality: Left;   BIOPSY  12/02/2020   Procedure: BIOPSY;  Surgeon: Daneil Dolin, MD;  Location: [facility];  Service: Endoscopy;;  gastric   CHOLECYSTECTOMY     COLONOSCOPY N/A 03/25/2020   [place]: two tubular adenomas removed. next colonoscopy five years   ESOPHAGOGASTRODUODENOSCOPY N/A 12/02/2020   Procedure: ESOPHAGOGASTRODUODENOSCOPY (EGD);  Surgeon: Daneil Dolin, MD;  Location: [facility];  Service: Endoscopy;  Laterality:  N/A;  am appt   POLYPECTOMY  03/25/2020   Procedure: POLYPECTOMY;  Surgeon: Daneil Dolin, MD;  Location: [facility];  Service: Endoscopy;;   POLYPECTOMY  12/02/2020   Procedure: POLYPECTOMY;  Surgeon: Daneil Dolin, MD;  Location: [facility];  Service: Endoscopy;;  gastric     OB History   No obstetric history on file.     Family History  Problem Relation Age of Onset   Kidney disease Mother        ESRD   Kidney disease Sister        ESRD   Colon polyps Brother 56   Kidney disease Maternal Grandmother        ESRD   Colon cancer Neg Hx      Social History   Tobacco Use   Smoking status: Never   Smokeless tobacco: Never  Vaping Use   Vaping Use: Never used  Substance Use Topics   Alcohol use: No   Drug use: No    Home Medications Prior to Admission medications   Medication Sig Start Date End Date Taking? Authorizing Provider  acetaminophen (TYLENOL) 500 MG tablet Take 1,000 mg by mouth every 6 (six) hours as needed for moderate pain.     [provider]  albuterol (PROVENTIL) (2.5 MG/3ML) 0.083% nebulizer solution Take 2.5 mg by nebulization 4 (four) times daily.     [provider]  albuterol (VENTOLIN HFA) 108 (90 Base) MCG/ACT inhaler Inhale 2 puffs into the lungs every 6 (six) hours as needed for wheezing or shortness of breath.    [provider]  amLODipine (NORVASC) 5 MG tablet Take 5 mg by mouth 2 (two) times daily.    [provider]  aspirin EC 81 MG tablet Take 81 mg by mouth in the morning.    [provider]  atorvastatin (LIPITOR) 40 MG tablet Take 40 mg by mouth at bedtime.     [provider]  calcitRIOL (ROCALTROL) 0.5 MCG capsule Take 0.5 mcg by mouth in the morning.    [provider]  carvedilol (COREG) 25 MG tablet Take 0.5 tablets (12.5 mg total) by mouth 2 (two) times daily with a meal. 02/10/21   Kathie Dike, MD  Cholecalciferol (VITAMIN D3) 50 MCG (2000 UT) TABS Take 2,000 Units by mouth every evening.    [provider]  clonazePAM (KLONOPIN) 0.5 MG tablet Take 0.5 mg by mouth 2 (two) times daily as needed for anxiety.    [provider]  dicyclomine (BENTYL) 10 MG capsule Take 10 mg by mouth 2 (two) times daily.     [provider]  docusate sodium (COLACE) 100 MG capsule Take 200 mg by mouth daily.    [provider]  epoetin alfa (EPOGEN) 3000 UNIT/ML injection Inject 3,000 Units into the skin every 14 (fourteen) days.     Bhutani, Manpreet S, MD  epoetin alfa-epbx (RETACRIT) 32671 UNIT/ML  injection 6,000 Units every 14 (fourteen) days. 11/27/20   Bhutani, Lavina Hamman, MD  ferrous sulfate 325 (65 FE) MG tablet Take 325 mg by mouth 2 (two) times daily with a meal.    [provider]  Fluticasone-Umeclidin-Vilant (TRELEGY ELLIPTA) 100-62.5-25 MCG/INH AEPB Inhale 1 puff into the lungs daily.    [provider]  furosemide (LASIX) 80 MG tablet Take 80 mg by mouth 2 (two) times daily.    [provider]  gabapentin (NEURONTIN) 300 MG capsule Take 300 mg by mouth 2 (two) times  daily.    [provider]  gemfibrozil (LOPID) 600 MG tablet Take 600 mg by mouth in the morning and at bedtime.    [provider]  Insulin Lispro Prot & Lispro (HUMALOG MIX 75/25 [place]) Inject 15-60 Units into the skin See admin instructions. Inject 60 units into the skin in the morning and 15 units at lunch and 60 units at supper    [provider]  levothyroxine (SYNTHROID) 50 MCG tablet Take 50 mcg by mouth daily before breakfast.  03/05/20   [provider]  montelukast (SINGULAIR) 10 MG tablet Take 10 mg by mouth at bedtime.    [provider]  omeprazole (PRILOSEC) 20 MG capsule Take 20 mg by mouth every evening.    [provider]  ondansetron (ZOFRAN) 4 MG tablet Take 4 mg by mouth every 8 (eight) hours as needed for nausea or vomiting. 09/28/20   [provider]  sevelamer carbonate (RENVELA) 800 MG tablet Take 800 mg by mouth 3 (three) times daily with meals. 11/20/20   [provider]  sitaGLIPtin (JANUVIA) 25 MG tablet Take 25 mg by mouth in the morning.    [provider]  sodium bicarbonate 650 MG tablet Take 650 mg by mouth 2 (two) times daily.    [provider]  triamcinolone (KENALOG) 0.1 % Apply 1 application topically daily as needed (skin irritation). To feet 07/07/20   [provider]  Vitamin A 2400 MCG (8000 UT) TABS Take 2,400 mcg by mouth every evening.    [provider]  vitamin B-12 (CYANOCOBALAMIN) 500 MCG tablet Take 500 mcg by mouth in the morning.    [provider]  vitamin E 180 MG (400 UNITS) capsule Take 400 Units by mouth daily.    [provider]    Allergies    Latex, Niacin, Other, Tomato, and Bactrim [sulfamethoxazole-trimethoprim]  Review of Systems   Review of Systems  Constitutional:  Negative for fever.  HENT:  Negative for sore throat.   Eyes:  Negative for visual disturbance.  Respiratory:  Negative for shortness of breath.   Cardiovascular:  Negative for chest pain.  Gastrointestinal:  Negative for abdominal pain.  Genitourinary:  Negative for dysuria.  Musculoskeletal:  Negative for back pain and neck pain.  Skin:  Negative for rash.  Neurological:  Negative for headaches.   Physical Exam Updated Vital Signs BP (!) 146/75   Pulse 71   Temp 98.3 F (36.8 C) (Oral)   Resp 17   SpO2 98%   Physical Exam Vitals and nursing note reviewed.  Constitutional:      General: She is not in acute distress.    Appearance: Normal appearance. She is well-developed.  HENT:     Head: Normocephalic and atraumatic.  Eyes:     Conjunctiva/sclera: Conjunctivae normal.  Cardiovascular:     Rate and Rhythm: Normal rate and regular rhythm.     Heart sounds: No murmur heard. Pulmonary:     Effort: Pulmonary effort is normal. No respiratory distress.     Breath sounds: Normal breath sounds.  Abdominal:     Palpations: Abdomen is soft.     Tenderness: There is no abdominal tenderness.  Musculoskeletal:        General: Tenderness present. Normal range of motion.     Cervical back: Neck supple.     Comments: She has some tenderness over her left upper arm at her fistula site.  No overlying bruising.  There does  appear to be a thrill.  Skin:    General: Skin is warm and dry.  Neurological:     General: No focal deficit present.     Mental Status: She is alert.    ED Results / Procedures / Treatments   Labs (all  labs ordered are listed, but only abnormal results are displayed) Labs Reviewed  CBC WITH DIFFERENTIAL/PLATELET - Abnormal; Notable for the following components:      Result Value   RBC 3.14 (*)    Hemoglobin 9.0 (*)    HCT 28.4 (*)    All other components within normal limits  BASIC METABOLIC PANEL - Abnormal; Notable for the following components:   BUN 51 (*)    Creatinine, Ser 7.25 (*)    Calcium 8.7 (*)    GFR, Estimated 6 (*)    All other components within normal limits  RESP PANEL BY RT-PCR (FLU A&B, COVID) [id number]    EKG EKG Interpretation  Date/Time:  Friday April 02 2021 18:11:24 EDT Ventricular Rate:  71 PR Interval:  182 QRS Duration: 83 QT Interval:  426 QTC Calculation: 463 R Axis:   -8 Text Interpretation: Sinus rhythm No significant change since 10/21 Confirmed by Aletta Edouard [phone]) on 04/02/2021 6:27:33 PM  Radiology VAS [place] (AVF, AVG)  Result Date: 04/02/2021 DIALYSIS ACCESS Patient Name:  Cindy Park  Date of Exam:   04/02/2021 Medical Rec #: [id number]       Accession #:    [id number] Date of Birth: 1956/04/05       Patient Gender: F Patient Age:   63 years Exam Location:  [facility] Procedure:      VAS [place] (AVF, AVG) Referring Phys: Legrand Como Jennier Schissler --------------------------------------------------------------------------------  Reason for Exam: No palpable thrill for AVF/AVG. Access Site: Left Upper Extremity. Access Type: Brachiobasilic AVF. History: 06/11/2020 - Left first stage brachiobasilic fistula          08/06/2020 - Left second stage brachiobasilic fistula transposition. Comparison Study: No prior studies. Performing Technologist: Oliver Hum RVT  Examination Guidelines: A complete evaluation includes B-mode imaging, spectral Doppler, color Doppler, and power Doppler as needed of all accessible portions of each vessel. Unilateral testing is considered an integral part of a complete examination.  Limited examinations for reoccurring indications may be performed as noted.  Findings:   Thrombus is noted within the left basilic vein / AVF. The thrombus begins at the anastamosis with the brachial artery, and extends into the proximal aspect. The thrombus does not extend into the basilic vein origin. Waveforms noted in the axillary, brachial, radial, and ulnar arteries are triphasic. All other veins appear patent and compressible.  Summary: Arteriovenous fistula-Thrombus noted. *See table(s) above for measurements and observations.    --------------------------------------------------------------------------------   Preliminary     Procedures Procedures   Medications Ordered in ED Medications - No data to display  ED Course  I have reviewed the triage vital signs and the nursing notes.  Pertinent labs & imaging results that were available during my care of the patient were reviewed by me and considered in my medical decision making (see chart for details).  Clinical Course as of 04/03/21 1034  Fri Apr 02, 2021  1942 Discussed with vascular surgery Dr. Trula Slade.  He said there is no role for surgical intervention and to talk with nephrology. [MB]  1950 Discussed with Dr. Royce Macadamia from [facility] who feels that the patient can wait until Monday when she  can have an appointment at the [facility] for fistulogram.  She also recommends giving her Lasix daily 80 mg. [MB]    Clinical Course User Index [MB] Hayden Rasmussen, MD   MDM Rules/Calculators/A&P                          This patient complains of left upper arm pain and unable to utilize fistula; this involves an extensive number of treatment Options and is a complaint that carries with it a high risk of complications and Morbidity. The differential includes thrombosed fistula, pseudoaneurysm, hematoma  I ordered, reviewed and interpreted labs, which included CBC with normal white count, stable hemoglobin,  chemistries consistent with end-stage renal disease, COVID testing negative I ordered imaging studies which included upper extremity duplex and I independently    visualized and interpreted imaging which showed thrombosed venous side of fistula Previous records obtained and reviewed in epic no recent admissions.  Prior operative interventions by Dr. Donnetta Hutching I consulted Dr. Trula Slade vascular surgery and Dr. Royce Macadamia nephrology and discussed lab and imaging findings  Critical Interventions: None  After the interventions stated above, I reevaluated the patient and found patient to be fairly asymptomatic and hemodynamically stable.  She is comfortable plan for outpatient management of her thrombosed fistula on Monday.  Clear return instructions given if having any problems over the weekend.   Final Clinical Impression(s) / ED Diagnoses Final diagnoses:  AV fistula occlusion, initial encounter [facility])    Rx / DC Orders ED Discharge Orders     None        Hayden Rasmussen, MD 04/03/21 1038

## 2021-04-02 NOTE — Discharge Instructions (Addendum)
You are seen in the emergency department for a fistula that is not working.  Nephrology is recommending that you go to the vascular access center on Monday to have a fistulogram.  The address is [place], [place] [address].  Please do not eat or drink after midnight Sunday night.  Continue your regular medications.  If you experience shortness of breath or other concerning symptoms return to the emergency department

## 2021-04-02 NOTE — ED Provider Notes (Signed)
Emergency Medicine Provider Triage Evaluation Note  Cindy Park , a 65 y.o. female  was evaluated in triage.  Pt complains of swelling, pain and redness to her dialysis catheter site. Denies fevers or chills.  Review of Systems  Positive: Swelling, pain and redness to dialysis catheter site Negative: Fevers, chills  Physical Exam  BP 136/82   Pulse 82   Temp 98.8 F (37.1 C) (Oral)   Resp 14   SpO2 100%  Gen:   Awake, no distress Resp:  Normal effort  MSK:   Moves extremities without difficulty  Other:  Swelling, erythema and ttp to the lue fistula site  Medical Decision Making  Medically screening exam initiated at 5:41 PM.  Appropriate orders placed.  Cindy Park was informed that the remainder of the evaluation will be completed by another provider, this initial triage assessment does not replace that evaluation, and the importance of remaining in the ED until their evaluation is complete.     Bishop Dublin 04/02/21 1743    Hayden Rasmussen, MD 04/03/21 1038

## 2021-04-05 DIAGNOSIS — I871 Compression of vein: Secondary | ICD-10-CM | POA: Diagnosis not present

## 2021-04-05 DIAGNOSIS — T82868A Thrombosis of vascular prosthetic devices, implants and grafts, initial encounter: Secondary | ICD-10-CM | POA: Diagnosis not present

## 2021-04-05 DIAGNOSIS — N186 End stage renal disease: Secondary | ICD-10-CM | POA: Diagnosis not present

## 2021-04-05 DIAGNOSIS — Z992 Dependence on renal dialysis: Secondary | ICD-10-CM | POA: Diagnosis not present

## 2021-04-06 DIAGNOSIS — N186 End stage renal disease: Secondary | ICD-10-CM | POA: Diagnosis not present

## 2021-04-06 DIAGNOSIS — Z23 Encounter for immunization: Secondary | ICD-10-CM | POA: Diagnosis not present

## 2021-04-06 DIAGNOSIS — Z992 Dependence on renal dialysis: Secondary | ICD-10-CM | POA: Diagnosis not present

## 2021-04-08 DIAGNOSIS — N186 End stage renal disease: Secondary | ICD-10-CM | POA: Diagnosis not present

## 2021-04-08 DIAGNOSIS — Z992 Dependence on renal dialysis: Secondary | ICD-10-CM | POA: Diagnosis not present

## 2021-04-08 DIAGNOSIS — Z23 Encounter for immunization: Secondary | ICD-10-CM | POA: Diagnosis not present

## 2021-04-10 DIAGNOSIS — Z992 Dependence on renal dialysis: Secondary | ICD-10-CM | POA: Diagnosis not present

## 2021-04-10 DIAGNOSIS — N186 End stage renal disease: Secondary | ICD-10-CM | POA: Diagnosis not present

## 2021-04-10 DIAGNOSIS — Z23 Encounter for immunization: Secondary | ICD-10-CM | POA: Diagnosis not present

## 2021-04-13 DIAGNOSIS — Z992 Dependence on renal dialysis: Secondary | ICD-10-CM | POA: Diagnosis not present

## 2021-04-13 DIAGNOSIS — Z23 Encounter for immunization: Secondary | ICD-10-CM | POA: Diagnosis not present

## 2021-04-13 DIAGNOSIS — N186 End stage renal disease: Secondary | ICD-10-CM | POA: Diagnosis not present

## 2021-04-15 DIAGNOSIS — N186 End stage renal disease: Secondary | ICD-10-CM | POA: Diagnosis not present

## 2021-04-15 DIAGNOSIS — Z992 Dependence on renal dialysis: Secondary | ICD-10-CM | POA: Diagnosis not present

## 2021-04-15 DIAGNOSIS — Z23 Encounter for immunization: Secondary | ICD-10-CM | POA: Diagnosis not present

## 2021-04-16 DIAGNOSIS — Z992 Dependence on renal dialysis: Secondary | ICD-10-CM | POA: Diagnosis not present

## 2021-04-16 DIAGNOSIS — Z299 Encounter for prophylactic measures, unspecified: Secondary | ICD-10-CM | POA: Diagnosis not present

## 2021-04-16 DIAGNOSIS — E1122 Type 2 diabetes mellitus with diabetic chronic kidney disease: Secondary | ICD-10-CM | POA: Diagnosis not present

## 2021-04-16 DIAGNOSIS — E1165 Type 2 diabetes mellitus with hyperglycemia: Secondary | ICD-10-CM | POA: Diagnosis not present

## 2021-04-16 DIAGNOSIS — I10 Essential (primary) hypertension: Secondary | ICD-10-CM | POA: Diagnosis not present

## 2021-04-17 DIAGNOSIS — N186 End stage renal disease: Secondary | ICD-10-CM | POA: Diagnosis not present

## 2021-04-17 DIAGNOSIS — Z23 Encounter for immunization: Secondary | ICD-10-CM | POA: Diagnosis not present

## 2021-04-17 DIAGNOSIS — Z992 Dependence on renal dialysis: Secondary | ICD-10-CM | POA: Diagnosis not present

## 2021-04-19 DIAGNOSIS — I10 Essential (primary) hypertension: Secondary | ICD-10-CM | POA: Diagnosis not present

## 2021-04-19 DIAGNOSIS — N186 End stage renal disease: Secondary | ICD-10-CM | POA: Diagnosis not present

## 2021-04-19 DIAGNOSIS — I5032 Chronic diastolic (congestive) heart failure: Secondary | ICD-10-CM | POA: Diagnosis not present

## 2021-04-20 DIAGNOSIS — Z23 Encounter for immunization: Secondary | ICD-10-CM | POA: Diagnosis not present

## 2021-04-20 DIAGNOSIS — N186 End stage renal disease: Secondary | ICD-10-CM | POA: Diagnosis not present

## 2021-04-20 DIAGNOSIS — Z992 Dependence on renal dialysis: Secondary | ICD-10-CM | POA: Diagnosis not present

## 2021-04-21 DIAGNOSIS — N186 End stage renal disease: Secondary | ICD-10-CM | POA: Diagnosis not present

## 2021-04-21 DIAGNOSIS — N183 Chronic kidney disease, stage 3 unspecified: Secondary | ICD-10-CM | POA: Diagnosis not present

## 2021-04-21 DIAGNOSIS — E1129 Type 2 diabetes mellitus with other diabetic kidney complication: Secondary | ICD-10-CM | POA: Diagnosis not present

## 2021-04-21 DIAGNOSIS — J441 Chronic obstructive pulmonary disease with (acute) exacerbation: Secondary | ICD-10-CM | POA: Diagnosis not present

## 2021-04-21 DIAGNOSIS — Z992 Dependence on renal dialysis: Secondary | ICD-10-CM | POA: Diagnosis not present

## 2021-04-21 DIAGNOSIS — I10 Essential (primary) hypertension: Secondary | ICD-10-CM | POA: Diagnosis not present

## 2021-04-21 DIAGNOSIS — E039 Hypothyroidism, unspecified: Secondary | ICD-10-CM | POA: Diagnosis not present

## 2021-04-22 DIAGNOSIS — Z992 Dependence on renal dialysis: Secondary | ICD-10-CM | POA: Diagnosis not present

## 2021-04-22 DIAGNOSIS — N186 End stage renal disease: Secondary | ICD-10-CM | POA: Diagnosis not present

## 2021-04-22 DIAGNOSIS — Z23 Encounter for immunization: Secondary | ICD-10-CM | POA: Diagnosis not present

## 2021-04-24 DIAGNOSIS — Z992 Dependence on renal dialysis: Secondary | ICD-10-CM | POA: Diagnosis not present

## 2021-04-24 DIAGNOSIS — Z23 Encounter for immunization: Secondary | ICD-10-CM | POA: Diagnosis not present

## 2021-04-24 DIAGNOSIS — N186 End stage renal disease: Secondary | ICD-10-CM | POA: Diagnosis not present

## 2021-04-27 DIAGNOSIS — Z23 Encounter for immunization: Secondary | ICD-10-CM | POA: Diagnosis not present

## 2021-04-27 DIAGNOSIS — Z992 Dependence on renal dialysis: Secondary | ICD-10-CM | POA: Diagnosis not present

## 2021-04-27 DIAGNOSIS — N186 End stage renal disease: Secondary | ICD-10-CM | POA: Diagnosis not present

## 2021-04-29 DIAGNOSIS — Z992 Dependence on renal dialysis: Secondary | ICD-10-CM | POA: Diagnosis not present

## 2021-04-29 DIAGNOSIS — Z23 Encounter for immunization: Secondary | ICD-10-CM | POA: Diagnosis not present

## 2021-04-29 DIAGNOSIS — N186 End stage renal disease: Secondary | ICD-10-CM | POA: Diagnosis not present

## 2021-05-01 DIAGNOSIS — Z23 Encounter for immunization: Secondary | ICD-10-CM | POA: Diagnosis not present

## 2021-05-01 DIAGNOSIS — N186 End stage renal disease: Secondary | ICD-10-CM | POA: Diagnosis not present

## 2021-05-01 DIAGNOSIS — Z992 Dependence on renal dialysis: Secondary | ICD-10-CM | POA: Diagnosis not present

## 2021-05-04 DIAGNOSIS — Z992 Dependence on renal dialysis: Secondary | ICD-10-CM | POA: Diagnosis not present

## 2021-05-04 DIAGNOSIS — Z23 Encounter for immunization: Secondary | ICD-10-CM | POA: Diagnosis not present

## 2021-05-04 DIAGNOSIS — N186 End stage renal disease: Secondary | ICD-10-CM | POA: Diagnosis not present

## 2021-05-06 DIAGNOSIS — Z992 Dependence on renal dialysis: Secondary | ICD-10-CM | POA: Diagnosis not present

## 2021-05-06 DIAGNOSIS — Z23 Encounter for immunization: Secondary | ICD-10-CM | POA: Diagnosis not present

## 2021-05-06 DIAGNOSIS — N186 End stage renal disease: Secondary | ICD-10-CM | POA: Diagnosis not present

## 2021-05-07 DIAGNOSIS — Z299 Encounter for prophylactic measures, unspecified: Secondary | ICD-10-CM | POA: Diagnosis not present

## 2021-05-07 DIAGNOSIS — R110 Nausea: Secondary | ICD-10-CM | POA: Diagnosis not present

## 2021-05-07 DIAGNOSIS — E1129 Type 2 diabetes mellitus with other diabetic kidney complication: Secondary | ICD-10-CM | POA: Diagnosis not present

## 2021-05-07 DIAGNOSIS — R809 Proteinuria, unspecified: Secondary | ICD-10-CM | POA: Diagnosis not present

## 2021-05-07 DIAGNOSIS — R519 Headache, unspecified: Secondary | ICD-10-CM | POA: Diagnosis not present

## 2021-05-08 DIAGNOSIS — Z23 Encounter for immunization: Secondary | ICD-10-CM | POA: Diagnosis not present

## 2021-05-08 DIAGNOSIS — Z992 Dependence on renal dialysis: Secondary | ICD-10-CM | POA: Diagnosis not present

## 2021-05-08 DIAGNOSIS — N186 End stage renal disease: Secondary | ICD-10-CM | POA: Diagnosis not present

## 2021-05-10 ENCOUNTER — Encounter (INDEPENDENT_AMBULATORY_CARE_PROVIDER_SITE_OTHER): Payer: Medicare Other | Admitting: Ophthalmology

## 2021-05-10 DIAGNOSIS — H3581 Retinal edema: Secondary | ICD-10-CM

## 2021-05-10 DIAGNOSIS — H35033 Hypertensive retinopathy, bilateral: Secondary | ICD-10-CM

## 2021-05-10 DIAGNOSIS — H35413 Lattice degeneration of retina, bilateral: Secondary | ICD-10-CM

## 2021-05-10 DIAGNOSIS — H33323 Round hole, bilateral: Secondary | ICD-10-CM

## 2021-05-10 DIAGNOSIS — E113313 Type 2 diabetes mellitus with moderate nonproliferative diabetic retinopathy with macular edema, bilateral: Secondary | ICD-10-CM

## 2021-05-10 DIAGNOSIS — H25813 Combined forms of age-related cataract, bilateral: Secondary | ICD-10-CM

## 2021-05-10 DIAGNOSIS — H04123 Dry eye syndrome of bilateral lacrimal glands: Secondary | ICD-10-CM

## 2021-05-10 DIAGNOSIS — I10 Essential (primary) hypertension: Secondary | ICD-10-CM

## 2021-05-11 DIAGNOSIS — Z992 Dependence on renal dialysis: Secondary | ICD-10-CM | POA: Diagnosis not present

## 2021-05-11 DIAGNOSIS — N186 End stage renal disease: Secondary | ICD-10-CM | POA: Diagnosis not present

## 2021-05-11 DIAGNOSIS — Z23 Encounter for immunization: Secondary | ICD-10-CM | POA: Diagnosis not present

## 2021-05-13 DIAGNOSIS — Z992 Dependence on renal dialysis: Secondary | ICD-10-CM | POA: Diagnosis not present

## 2021-05-13 DIAGNOSIS — Z23 Encounter for immunization: Secondary | ICD-10-CM | POA: Diagnosis not present

## 2021-05-13 DIAGNOSIS — N186 End stage renal disease: Secondary | ICD-10-CM | POA: Diagnosis not present

## 2021-05-15 DIAGNOSIS — N186 End stage renal disease: Secondary | ICD-10-CM | POA: Diagnosis not present

## 2021-05-15 DIAGNOSIS — Z23 Encounter for immunization: Secondary | ICD-10-CM | POA: Diagnosis not present

## 2021-05-15 DIAGNOSIS — Z992 Dependence on renal dialysis: Secondary | ICD-10-CM | POA: Diagnosis not present

## 2021-05-17 ENCOUNTER — Encounter (INDEPENDENT_AMBULATORY_CARE_PROVIDER_SITE_OTHER): Payer: Medicare Other | Admitting: Ophthalmology

## 2021-05-17 DIAGNOSIS — H33323 Round hole, bilateral: Secondary | ICD-10-CM

## 2021-05-17 DIAGNOSIS — H3581 Retinal edema: Secondary | ICD-10-CM

## 2021-05-17 DIAGNOSIS — H04123 Dry eye syndrome of bilateral lacrimal glands: Secondary | ICD-10-CM

## 2021-05-17 DIAGNOSIS — H35413 Lattice degeneration of retina, bilateral: Secondary | ICD-10-CM

## 2021-05-17 DIAGNOSIS — E083313 Diabetes mellitus due to underlying condition with moderate nonproliferative diabetic retinopathy with macular edema, bilateral: Secondary | ICD-10-CM

## 2021-05-17 DIAGNOSIS — E113313 Type 2 diabetes mellitus with moderate nonproliferative diabetic retinopathy with macular edema, bilateral: Secondary | ICD-10-CM

## 2021-05-17 DIAGNOSIS — H25813 Combined forms of age-related cataract, bilateral: Secondary | ICD-10-CM

## 2021-05-17 DIAGNOSIS — I10 Essential (primary) hypertension: Secondary | ICD-10-CM

## 2021-05-17 DIAGNOSIS — H35033 Hypertensive retinopathy, bilateral: Secondary | ICD-10-CM

## 2021-05-18 DIAGNOSIS — Z23 Encounter for immunization: Secondary | ICD-10-CM | POA: Diagnosis not present

## 2021-05-18 DIAGNOSIS — D509 Iron deficiency anemia, unspecified: Secondary | ICD-10-CM | POA: Diagnosis not present

## 2021-05-18 DIAGNOSIS — N186 End stage renal disease: Secondary | ICD-10-CM | POA: Diagnosis not present

## 2021-05-18 DIAGNOSIS — Z992 Dependence on renal dialysis: Secondary | ICD-10-CM | POA: Diagnosis not present

## 2021-05-20 DIAGNOSIS — N186 End stage renal disease: Secondary | ICD-10-CM | POA: Diagnosis not present

## 2021-05-20 DIAGNOSIS — Z992 Dependence on renal dialysis: Secondary | ICD-10-CM | POA: Diagnosis not present

## 2021-05-20 DIAGNOSIS — Z23 Encounter for immunization: Secondary | ICD-10-CM | POA: Diagnosis not present

## 2021-05-21 DIAGNOSIS — Z992 Dependence on renal dialysis: Secondary | ICD-10-CM | POA: Diagnosis not present

## 2021-05-21 DIAGNOSIS — I10 Essential (primary) hypertension: Secondary | ICD-10-CM | POA: Diagnosis not present

## 2021-05-21 DIAGNOSIS — N186 End stage renal disease: Secondary | ICD-10-CM | POA: Diagnosis not present

## 2021-05-22 DIAGNOSIS — Z992 Dependence on renal dialysis: Secondary | ICD-10-CM | POA: Diagnosis not present

## 2021-05-22 DIAGNOSIS — N186 End stage renal disease: Secondary | ICD-10-CM | POA: Diagnosis not present

## 2021-05-25 DIAGNOSIS — N2581 Secondary hyperparathyroidism of renal origin: Secondary | ICD-10-CM | POA: Diagnosis not present

## 2021-05-25 DIAGNOSIS — D509 Iron deficiency anemia, unspecified: Secondary | ICD-10-CM | POA: Diagnosis not present

## 2021-05-25 DIAGNOSIS — R0789 Other chest pain: Secondary | ICD-10-CM | POA: Diagnosis not present

## 2021-05-25 DIAGNOSIS — I5032 Chronic diastolic (congestive) heart failure: Secondary | ICD-10-CM | POA: Diagnosis not present

## 2021-05-25 DIAGNOSIS — Z992 Dependence on renal dialysis: Secondary | ICD-10-CM | POA: Diagnosis not present

## 2021-05-25 DIAGNOSIS — Z794 Long term (current) use of insulin: Secondary | ICD-10-CM | POA: Diagnosis not present

## 2021-05-25 DIAGNOSIS — I10 Essential (primary) hypertension: Secondary | ICD-10-CM | POA: Diagnosis not present

## 2021-05-25 DIAGNOSIS — N186 End stage renal disease: Secondary | ICD-10-CM | POA: Diagnosis not present

## 2021-05-25 DIAGNOSIS — E119 Type 2 diabetes mellitus without complications: Secondary | ICD-10-CM | POA: Diagnosis not present

## 2021-05-25 NOTE — Progress Notes (Signed)
[facility] Note  05/31/2021     CHIEF COMPLAINT Patient presents for Retina Follow Up   HISTORY OF PRESENT ILLNESS: Cindy Park is a 65 y.o. female who presents to the clinic today for:   HPI     Retina Follow Up   Patient presents with  Diabetic Retinopathy.  In both eyes.  This started months ago.  Severity is moderate.  Duration of months.  Since onset it is stable.  I, the attending physician,  performed the HPI with the patient and updated documentation appropriately.        Comments   Pt states vision is the same OU.  Pt denies eye pain or discomfort.  Pt denies new or worsening floaters or fol OU.  Pt is now on dialysis (left arm fistula).  Scheduled for surgery to allow patient to do dialysis at home.      Last edited by Bernarda Caffey, MD on 05/31/2021 10:44 PM.    Pt has been in the hospital twice since last visit.  She had fluid around heart and had to start dialysis.    Referring physician: Leticia Clas OD [address], [place] [address]  HISTORICAL INFORMATION:   Selected notes from the MEDICAL RECORD NUMBER Referred by Dr. Leticia Clas for DM eval LEE: 08.19.2021 BCVA OD: 20/20 OS: 20/20 Ocular Hx- cataract, CWS, macular edema PMH- DM, HTN, stage 3 kidney failure, CHF   CURRENT MEDICATIONS: No current outpatient medications on file. (Ophthalmic Drugs)   No current facility-administered medications for this visit. (Ophthalmic Drugs)   Current Outpatient Medications (Other)  Medication Sig   acetaminophen (TYLENOL) 500 MG tablet Take 1,000 mg by mouth every 6 (six) hours as needed for moderate pain.    albuterol (PROVENTIL) (2.5 MG/3ML) 0.083% nebulizer solution Take 2.5 mg by nebulization 4 (four) times daily.    albuterol (VENTOLIN HFA) 108 (90 Base) MCG/ACT inhaler Inhale 2 puffs into the lungs every 6 (six) hours as needed for wheezing or shortness of breath.   amLODipine (NORVASC) 5 MG tablet Take 5  mg by mouth 2 (two) times daily.   aspirin EC 81 MG tablet Take 81 mg by mouth in the morning.   atorvastatin (LIPITOR) 40 MG tablet Take 40 mg by mouth at bedtime.    calcitRIOL (ROCALTROL) 0.5 MCG capsule Take 0.5 mcg by mouth in the morning.   carvedilol (COREG) 25 MG tablet Take 0.5 tablets (12.5 mg total) by mouth 2 (two) times daily with a meal.   Cholecalciferol (VITAMIN D3) 50 MCG (2000 UT) TABS Take 2,000 Units by mouth every evening.   clonazePAM (KLONOPIN) 0.5 MG tablet Take 0.5 mg by mouth 2 (two) times daily as needed for anxiety.   dicyclomine (BENTYL) 10 MG capsule Take 10 mg by mouth 2 (two) times daily.    docusate sodium (COLACE) 100 MG capsule Take 200 mg by mouth daily.   epoetin alfa (EPOGEN) 3000 UNIT/ML injection Inject 3,000 Units into the skin every 14 (fourteen) days.    epoetin alfa-epbx (RETACRIT) 76195 UNIT/ML injection 6,000 Units every 14 (fourteen) days.   ferrous sulfate 325 (65 FE) MG tablet Take 325 mg by mouth 2 (two) times daily with a meal.   Fluticasone-Umeclidin-Vilant (TRELEGY ELLIPTA) 100-62.5-25 MCG/INH AEPB Inhale 1 puff into the lungs daily.   furosemide (LASIX) 80 MG tablet Take 80 mg by mouth 2 (two) times daily.   gabapentin (NEURONTIN) 300 MG capsule Take  300 mg by mouth 2 (two) times daily.   gemfibrozil (LOPID) 600 MG tablet Take 600 mg by mouth in the morning and at bedtime.   Insulin Lispro Prot & Lispro (HUMALOG MIX 75/25 [place]) Inject 15-60 Units into the skin See admin instructions. Inject 60 units into the skin in the morning and 15 units at lunch and 60 units at supper   levothyroxine (SYNTHROID) 50 MCG tablet Take 50 mcg by mouth daily before breakfast.    montelukast (SINGULAIR) 10 MG tablet Take 10 mg by mouth at bedtime.   omeprazole (PRILOSEC) 20 MG capsule Take 20 mg by mouth every evening.   ondansetron (ZOFRAN) 4 MG tablet Take 4 mg by mouth every 8 (eight) hours as needed for nausea or vomiting.   sevelamer carbonate (RENVELA) 800  MG tablet Take 800 mg by mouth 3 (three) times daily with meals.   sitaGLIPtin (JANUVIA) 25 MG tablet Take 25 mg by mouth in the morning.   sodium bicarbonate 650 MG tablet Take 650 mg by mouth 2 (two) times daily.   triamcinolone (KENALOG) 0.1 % Apply 1 application topically daily as needed (skin irritation). To feet   Vitamin A 2400 MCG (8000 UT) TABS Take 2,400 mcg by mouth every evening.   vitamin B-12 (CYANOCOBALAMIN) 500 MCG tablet Take 500 mcg by mouth in the morning.   vitamin E 180 MG (400 UNITS) capsule Take 400 Units by mouth daily.   No current facility-administered medications for this visit. (Other)   REVIEW OF SYSTEMS: ROS   Positive for: Gastrointestinal, Genitourinary, Endocrine, Eyes, Respiratory Negative for: Constitutional, Neurological, Skin, Musculoskeletal, HENT, Cardiovascular, Psychiatric, Allergic/Imm, Heme/Lymph Last edited by Doneen Poisson on 05/31/2021  2:27 PM.    ALLERGIES Allergies  Allergen Reactions   Latex Shortness Of Breath and Rash    With powder    Niacin Other (See Comments)    Burning   Other Dermatitis    Powder in the gloves    Tomato Other (See Comments)    Breaks pt out    Bactrim [Sulfamethoxazole-Trimethoprim] Swelling and Rash    PAST MEDICAL HISTORY Past Medical History:  Diagnosis Date   Anemia    Arthritis    Asthma    Cataract    Combined form OU   COPD (chronic obstructive pulmonary disease) ([place])    Diabetes mellitus without complication ([place])    Diabetic retinopathy ([place])    BDR OU   Family history of adverse reaction to anesthesia    sister- difficult to wake up from anesthesia   Fluid excess    GERD (gastroesophageal reflux disease)    Goiter    Hypertension    Hypertensive retinopathy    OU   Renal failure    Past Surgical History:  Procedure Laterality Date   ABDOMINAL HYSTERECTOMY     AV FISTULA PLACEMENT Left 06/11/2020   Procedure: LEFT ARM ARTERIOVENOUS (AV) FISTULA CREATION;  Surgeon: [name], MD;  Location: [facility];  Service: Vascular;  Laterality: Left;   BASCILIC VEIN TRANSPOSITION Left 08/06/2020   Procedure: LEFT ARM SECOND STAGE [place];  Surgeon: [name], MD;  Location: [facility];  Service: Vascular;  Laterality: Left;   BIOPSY  12/02/2020   Procedure: BIOPSY;  Surgeon: Daneil Dolin, MD;  Location: [facility];  Service: Endoscopy;;  gastric   CHOLECYSTECTOMY     COLONOSCOPY N/A 03/25/2020   [place]: two tubular adenomas removed. next colonoscopy five years   ESOPHAGOGASTRODUODENOSCOPY N/A  12/02/2020   Procedure: ESOPHAGOGASTRODUODENOSCOPY (EGD);  Surgeon: Daneil Dolin, MD;  [facility]: [facility];  Service: Endoscopy;  Laterality: N/A;  am appt   POLYPECTOMY  03/25/2020   Procedure: POLYPECTOMY;  Surgeon: Daneil Dolin, MD;  [facility]: [facility];  Service: Endoscopy;;   POLYPECTOMY  12/02/2020   Procedure: POLYPECTOMY;  Surgeon: Daneil Dolin, MD;  [facility];  Service: Endoscopy;;  gastric    FAMILY HISTORY Family History  Problem Relation Age of Onset   Kidney disease Mother        ESRD   Kidney disease Sister        ESRD   Colon polyps Brother 26   Kidney disease Maternal Grandmother        ESRD   Colon cancer Neg Hx     SOCIAL HISTORY Social History   Tobacco Use   Smoking status: Never   Smokeless tobacco: Never  Vaping Use   Vaping Use: Never used  Substance Use Topics   Alcohol use: No   Drug use: No       OPHTHALMIC EXAM:  Base Eye Exam     Visual Acuity (Snellen - Linear)       Right Left   Dist cc 20/30 -2 20/50 -2   Dist ph cc NI 20/30 -2         Tonometry (Tonopen, 2:35 PM)       Right Left   Pressure 17 19         Pupils       Dark Light Shape React APD   Right 4 3 Round Brisk 0   Left 4 3 Round Brisk 0         Visual Fields       Left Right    Full Full         Extraocular Movement       Right Left    Full Full         Neuro/Psych     Oriented  x3: Yes   Mood/Affect: Normal         Dilation     Both eyes: 1.0% Mydriacyl, 2.5% Phenylephrine @ 2:35 PM           Slit Lamp and Fundus Exam     Slit Lamp Exam       Right Left   Lids/Lashes Dermato, mild MGD Dermarto, mild MGD   Conjunctiva/Sclera Nasal pinguecula, mild melanosis, inferior Conjunctivochalasis Nasal pinguecula, mild melanosis, inferior Conjunctivochalasis   Cornea 1+PEE, trace arcus trace PEE, trace arcus   Anterior Chamber Deep and quiet Deep and quiet   Iris Round and dilated Round and dilated   Lens 2+ NS, 2-3+ cortical 2-3+ NS, 2-3+ cortical   Vitreous Synerisis Synerisis         Fundus Exam       Right Left   Disc Pink, sharp, Compact, focal PPP Pink, sharp, compact   C/D Ratio 0.1 0.2   Macula Blunted foveal reflex, mild rpe mottling, scattered cystic changes, scattered MA / IRH improved Good foveal reflex, mild rpe mottling, trace cystic changes, scattered MA   Vessels attenuated, mild tortuousity attenuated, mild tortuousity, mild Copper wiring   Periphery Attached; punctate CWS nasal to disc, Focal pigmented lattice with atrophic hole at 12 and in IT quad, greatest at 0730 -- good laser surrounding all patches of lattice, scattered MAs greatest posteriorly, No new RT/RD/lattice Attached, Focal pigmented lattice from 0530-0700 -- good  laser surrounding, scattered MA greatest  posteriorly, No new RT/RD/lattice           Refraction     Wearing Rx       Sphere Cylinder Axis Add   Right [place] +0.50 045 +2.00   Left +0.50 +0.50 128 +2.00    Type: PAL           IMAGING AND PROCEDURES  Imaging and Procedures for 05/31/2021  OCT, Retina - OU - Both Eyes       Right Eye Quality was good. Central Foveal Thickness: 272. Progression has improved. Findings include abnormal foveal contour, intraretinal fluid, no SRF (Interval improvement in IRF -- residual cystic changes inferotemp.).   Left Eye Quality was good. Central Foveal  Thickness: 264. Progression has improved. Findings include normal foveal contour, intraretinal fluid, no SRF, vitreomacular adhesion (Interval improvement in IRF, trace cystic changes ).   Notes *Images captured and stored on drive  Diagnosis / Impression:  Mod NPDR w/ DME OU OD: Interval improvement in IRF -- residual cyst changes inferotemp. OS: Interval improvement in IRF, trace cystic changes   Clinical management:  See below  Abbreviations: NFP - Normal foveal profile. CME - cystoid macular edema. PED - pigment epithelial detachment. IRF - intraretinal fluid. SRF - subretinal fluid. EZ - ellipsoid zone. ERM - epiretinal membrane. ORA - outer retinal atrophy. ORT - outer retinal tubulation. SRHM - subretinal hyper-reflective material. IRHM - intraretinal hyper-reflective material      Intravitreal Injection, Pharmacologic Agent - OD - Right Eye       Time Out 05/31/2021. 4:17 PM. Confirmed correct patient, procedure, site, and patient consented.   Anesthesia Topical anesthesia was used. Anesthetic medications included Lidocaine 2%, Proparacaine 0.5%.   Procedure Preparation included 5% betadine to ocular surface, eyelid speculum. A supplied needle was used.   Injection: 1.25 mg Bevacizumab 1.25mg /0.61ml   Route: Intravitreal, Site: Right Eye   NDC: H061816, Lot: [id number] , Expiration date: 07/07/2021, Waste: 0 mL   Post-op Post injection exam found visual acuity of at least counting fingers. The patient tolerated the procedure well. There were no complications. The patient received written and verbal post procedure care education. Post injection medications were not given.      Intravitreal Injection, Pharmacologic Agent - OS - Left Eye       Time Out 05/31/2021. 4:18 PM. Confirmed correct patient, procedure, site, and patient consented.   Anesthesia Topical anesthesia was used. Anesthetic medications included Lidocaine 2%, Proparacaine 0.5%.    Procedure Preparation included 5% betadine to ocular surface, eyelid speculum. A (32g) needle was used.   Injection: 1.25 mg Bevacizumab 1.25mg /0.31ml   Route: Intravitreal, Site: Left Eye   NDC: 80m, [id number], Expiration date: 07/14/2021, Waste: 0.05 mL   Post-op Post injection exam found visual acuity of at least counting fingers. The patient tolerated the procedure well. There were no complications. The patient received written and verbal post procedure care education. Post injection medications were not given.            ASSESSMENT/PLAN:   ICD-10-CM   1. Moderate nonproliferative diabetic retinopathy of both eyes with macular edema associated with type 2 diabetes mellitus (HCC)  07/27/2021 Intravitreal Injection, Pharmacologic Agent - OD - Right Eye    Intravitreal Injection, Pharmacologic Agent - OS - Left Eye    Bevacizumab (AVASTIN) SOLN 1.25 mg    Bevacizumab (AVASTIN) SOLN 1.25 mg    2. Retinal edema  H35.81 OCT, Retina - OU - Both Eyes  3. Lattice degeneration of both retinas  H35.413     4. Retinal holes, bilateral  H33.323     5. Essential hypertension  I10     6. Hypertensive retinopathy of both eyes  H35.033     7. Combined forms of age-related cataract of both eyes  H25.813     8. Dry eyes, bilateral  H04.123     1,2. Moderate non-proliferative diabetic retinopathy w/ DME OU  **pt lost to f/u from 4.25.22 to 10.10.22 -- 5+ mos** hospitalized 2x and now on hemodialysis - Last A1c 5.8 on 6.18.22; 6.7 on 10.19.21 - s/p IVA OD #1 (02.23.22), #2 (03.25.22), #3 (04.25.22) - s/p IVA OS #1 (02.25.22), #2 (03.25.22), #3 (04.25.22) - exam shows scattered cystic changes and scattered MA/IRH  OU - FA 09.15.21 shows late leaking MA OU, no NV OU  - OCT shows OD: Interval improvement in IRF -- residual cystic changes inferotemp; OS: Interval improvement in IRF, trace cystic changes  - BCVA 20/30 OD,down from 20/20; OS 20/30 from 20/25 - recommend IVA OU  #4 today, 10.10.22 - pt wishes to proceed with injections OU  - RBA of procedure discussed, questions answered  - IVA informed consent obtained and signed, 02.23.22 (OD)  - IVA informed consent obtained and signed, 02.23.22 (OS) - see procedure note - f/u 4-6 weeks, DFE, OCT, FA transit OD, possible injection(s)  3,4. Lattice degeneration w/ atrophic holes, OU - patches of inferior lattice OU; OD with small patch at 12 - s/p laser retinopexy OD (09.29.21) -- good laser surrounding - s/p laser retinopexy OS (10.13.21) -- good laser surrounidng - monitor   5,6. Hypertensive retinopathy OU - discussed importance of tight BP control - monitor  7. Mixed Cataract OU - The symptoms of cataract, surgical options, and treatments and risks were discussed with patient. - discussed diagnosis and progression - not yet visually significant - monitor for now   8. Dry eyes OU - recommend artificial tears and lubricating ointment as needed  Ophthalmic Meds Ordered this visit:  Meds ordered this encounter  Medications   Bevacizumab (AVASTIN) SOLN 1.25 mg   Bevacizumab (AVASTIN) SOLN 1.25 mg     Return for 4-6 weeks , DFE, OCT, possible injection, FA (transit OD).  There are no Patient Instructions on file for this visit.  This document serves as a record of services personally performed by Gardiner Sleeper, MD, PhD. It was created on their behalf by Leeann Must, [place], an ophthalmic technician. The creation of this record is the provider's dictation and/or activities during the visit.    Electronically signed by: Leeann Must, COA @TODAY @ 11:07 PM  This document serves as a record of services personally performed by Gardiner Sleeper, MD, PhD. It was created on their behalf by Leonie Douglas, an ophthalmic technician. The creation of this record is the provider's dictation and/or activities during the visit.    Electronically signed by: Leonie Douglas COA, 05/31/21  11:07 PM  Gardiner Sleeper,  M.D., Ph.D. Diseases & Surgery of the Retina and [place] 05/31/2021   I have reviewed the above documentation for accuracy and completeness, and I agree with the above. Gardiner Sleeper, M.D., Ph.D. 05/31/21 11:07 PM  Abbreviations: M myopia (nearsighted); A astigmatism; H hyperopia (farsighted); P presbyopia; Mrx spectacle prescription;  CTL contact lenses; OD right eye; OS left eye; OU both eyes  XT exotropia; ET esotropia; PEK punctate epithelial keratitis; PEE punctate epithelial erosions; DES dry  eye syndrome; MGD meibomian gland dysfunction; ATs artificial tears; PFAT's preservative free artificial tears; [place] nuclear sclerotic cataract; PSC posterior subcapsular cataract; ERM epi-retinal membrane; PVD posterior vitreous detachment; RD retinal detachment; DM diabetes mellitus; DR diabetic retinopathy; NPDR non-proliferative diabetic retinopathy; PDR proliferative diabetic retinopathy; CSME clinically significant macular edema; DME diabetic macular edema; dbh dot blot hemorrhages; CWS cotton wool spot; POAG primary open angle glaucoma; C/D cup-to-disc ratio; HVF humphrey visual field; GVF goldmann visual field; OCT optical coherence tomography; IOP intraocular pressure; BRVO Branch retinal vein occlusion; CRVO central retinal vein occlusion; CRAO central retinal artery occlusion; BRAO branch retinal artery occlusion; RT retinal tear; SB scleral buckle; PPV pars plana vitrectomy; VH Vitreous hemorrhage; PRP panretinal laser photocoagulation; IVK intravitreal kenalog; VMT vitreomacular traction; MH Macular hole;  NVD neovascularization of the disc; NVE neovascularization elsewhere; AREDS age related eye disease study; ARMD age related macular degeneration; POAG primary open angle glaucoma; EBMD epithelial/anterior basement membrane dystrophy; ACIOL anterior chamber intraocular lens; IOL intraocular lens; PCIOL posterior chamber intraocular lens; Phaco/IOL  phacoemulsification with intraocular lens placement; [place] photorefractive keratectomy; LASIK laser assisted in situ keratomileusis; HTN hypertension; DM diabetes mellitus; COPD chronic obstructive pulmonary disease

## 2021-05-26 DIAGNOSIS — Z299 Encounter for prophylactic measures, unspecified: Secondary | ICD-10-CM | POA: Diagnosis not present

## 2021-05-26 DIAGNOSIS — E7800 Pure hypercholesterolemia, unspecified: Secondary | ICD-10-CM | POA: Diagnosis not present

## 2021-05-26 DIAGNOSIS — Z1211 Encounter for screening for malignant neoplasm of colon: Secondary | ICD-10-CM | POA: Diagnosis not present

## 2021-05-26 DIAGNOSIS — Z0000 Encounter for general adult medical examination without abnormal findings: Secondary | ICD-10-CM | POA: Diagnosis not present

## 2021-05-26 DIAGNOSIS — Z7189 Other specified counseling: Secondary | ICD-10-CM | POA: Diagnosis not present

## 2021-05-26 DIAGNOSIS — I10 Essential (primary) hypertension: Secondary | ICD-10-CM | POA: Diagnosis not present

## 2021-05-26 DIAGNOSIS — E1165 Type 2 diabetes mellitus with hyperglycemia: Secondary | ICD-10-CM | POA: Diagnosis not present

## 2021-05-26 DIAGNOSIS — E1122 Type 2 diabetes mellitus with diabetic chronic kidney disease: Secondary | ICD-10-CM | POA: Diagnosis not present

## 2021-05-27 DIAGNOSIS — N186 End stage renal disease: Secondary | ICD-10-CM | POA: Diagnosis not present

## 2021-05-27 DIAGNOSIS — Z992 Dependence on renal dialysis: Secondary | ICD-10-CM | POA: Diagnosis not present

## 2021-05-28 DIAGNOSIS — I120 Hypertensive chronic kidney disease with stage 5 chronic kidney disease or end stage renal disease: Secondary | ICD-10-CM | POA: Diagnosis not present

## 2021-05-28 DIAGNOSIS — K5900 Constipation, unspecified: Secondary | ICD-10-CM | POA: Diagnosis not present

## 2021-05-28 DIAGNOSIS — N186 End stage renal disease: Secondary | ICD-10-CM | POA: Diagnosis not present

## 2021-05-28 DIAGNOSIS — Z4902 Encounter for fitting and adjustment of peritoneal dialysis catheter: Secondary | ICD-10-CM | POA: Diagnosis not present

## 2021-05-28 DIAGNOSIS — E1122 Type 2 diabetes mellitus with diabetic chronic kidney disease: Secondary | ICD-10-CM | POA: Diagnosis not present

## 2021-05-29 DIAGNOSIS — N186 End stage renal disease: Secondary | ICD-10-CM | POA: Diagnosis not present

## 2021-05-29 DIAGNOSIS — Z992 Dependence on renal dialysis: Secondary | ICD-10-CM | POA: Diagnosis not present

## 2021-05-31 ENCOUNTER — Other Ambulatory Visit: Payer: Self-pay

## 2021-05-31 ENCOUNTER — Encounter (INDEPENDENT_AMBULATORY_CARE_PROVIDER_SITE_OTHER): Payer: Self-pay | Admitting: Ophthalmology

## 2021-05-31 ENCOUNTER — Ambulatory Visit (INDEPENDENT_AMBULATORY_CARE_PROVIDER_SITE_OTHER): Payer: Medicare Other | Admitting: Ophthalmology

## 2021-05-31 DIAGNOSIS — H25813 Combined forms of age-related cataract, bilateral: Secondary | ICD-10-CM

## 2021-05-31 DIAGNOSIS — I10 Essential (primary) hypertension: Secondary | ICD-10-CM

## 2021-05-31 DIAGNOSIS — H33323 Round hole, bilateral: Secondary | ICD-10-CM

## 2021-05-31 DIAGNOSIS — H04123 Dry eye syndrome of bilateral lacrimal glands: Secondary | ICD-10-CM | POA: Diagnosis not present

## 2021-05-31 DIAGNOSIS — H35033 Hypertensive retinopathy, bilateral: Secondary | ICD-10-CM | POA: Diagnosis not present

## 2021-05-31 DIAGNOSIS — E113313 Type 2 diabetes mellitus with moderate nonproliferative diabetic retinopathy with macular edema, bilateral: Secondary | ICD-10-CM | POA: Diagnosis not present

## 2021-05-31 DIAGNOSIS — H3581 Retinal edema: Secondary | ICD-10-CM

## 2021-05-31 DIAGNOSIS — H35413 Lattice degeneration of retina, bilateral: Secondary | ICD-10-CM

## 2021-05-31 DIAGNOSIS — E083313 Diabetes mellitus due to underlying condition with moderate nonproliferative diabetic retinopathy with macular edema, bilateral: Secondary | ICD-10-CM

## 2021-05-31 MED ORDER — BEVACIZUMAB CHEMO INJECTION 1.25MG/0.05ML SYRINGE FOR KALEIDOSCOPE
1.2500 mg | INTRAVITREAL | Status: AC | PRN
  Administered 2021-05-31: 1.25 mg via INTRAVITREAL

## 2021-06-01 DIAGNOSIS — N186 End stage renal disease: Secondary | ICD-10-CM | POA: Diagnosis not present

## 2021-06-01 DIAGNOSIS — Z992 Dependence on renal dialysis: Secondary | ICD-10-CM | POA: Diagnosis not present

## 2021-06-03 DIAGNOSIS — Z992 Dependence on renal dialysis: Secondary | ICD-10-CM | POA: Diagnosis not present

## 2021-06-03 DIAGNOSIS — N186 End stage renal disease: Secondary | ICD-10-CM | POA: Diagnosis not present

## 2021-06-05 DIAGNOSIS — N186 End stage renal disease: Secondary | ICD-10-CM | POA: Diagnosis not present

## 2021-06-05 DIAGNOSIS — Z992 Dependence on renal dialysis: Secondary | ICD-10-CM | POA: Diagnosis not present

## 2021-06-08 DIAGNOSIS — Z992 Dependence on renal dialysis: Secondary | ICD-10-CM | POA: Diagnosis not present

## 2021-06-08 DIAGNOSIS — N186 End stage renal disease: Secondary | ICD-10-CM | POA: Diagnosis not present

## 2021-06-08 DIAGNOSIS — N2581 Secondary hyperparathyroidism of renal origin: Secondary | ICD-10-CM | POA: Diagnosis not present

## 2021-06-09 DIAGNOSIS — N186 End stage renal disease: Secondary | ICD-10-CM | POA: Diagnosis not present

## 2021-06-09 DIAGNOSIS — Z992 Dependence on renal dialysis: Secondary | ICD-10-CM | POA: Diagnosis not present

## 2021-06-09 DIAGNOSIS — E1142 Type 2 diabetes mellitus with diabetic polyneuropathy: Secondary | ICD-10-CM | POA: Diagnosis not present

## 2021-06-09 DIAGNOSIS — Z794 Long term (current) use of insulin: Secondary | ICD-10-CM | POA: Diagnosis not present

## 2021-06-09 DIAGNOSIS — E1122 Type 2 diabetes mellitus with diabetic chronic kidney disease: Secondary | ICD-10-CM | POA: Diagnosis not present

## 2021-06-09 DIAGNOSIS — Z4902 Encounter for fitting and adjustment of peritoneal dialysis catheter: Secondary | ICD-10-CM | POA: Diagnosis not present

## 2021-06-09 DIAGNOSIS — E048 Other specified nontoxic goiter: Secondary | ICD-10-CM | POA: Diagnosis not present

## 2021-06-09 DIAGNOSIS — K660 Peritoneal adhesions (postprocedural) (postinfection): Secondary | ICD-10-CM | POA: Diagnosis not present

## 2021-06-09 DIAGNOSIS — I120 Hypertensive chronic kidney disease with stage 5 chronic kidney disease or end stage renal disease: Secondary | ICD-10-CM | POA: Diagnosis not present

## 2021-06-09 DIAGNOSIS — Z7984 Long term (current) use of oral hypoglycemic drugs: Secondary | ICD-10-CM | POA: Diagnosis not present

## 2021-06-10 DIAGNOSIS — N186 End stage renal disease: Secondary | ICD-10-CM | POA: Diagnosis not present

## 2021-06-10 DIAGNOSIS — Z992 Dependence on renal dialysis: Secondary | ICD-10-CM | POA: Diagnosis not present

## 2021-06-11 DIAGNOSIS — E119 Type 2 diabetes mellitus without complications: Secondary | ICD-10-CM | POA: Diagnosis not present

## 2021-06-12 DIAGNOSIS — N186 End stage renal disease: Secondary | ICD-10-CM | POA: Diagnosis not present

## 2021-06-12 DIAGNOSIS — Z992 Dependence on renal dialysis: Secondary | ICD-10-CM | POA: Diagnosis not present

## 2021-06-15 DIAGNOSIS — Z992 Dependence on renal dialysis: Secondary | ICD-10-CM | POA: Diagnosis not present

## 2021-06-15 DIAGNOSIS — D509 Iron deficiency anemia, unspecified: Secondary | ICD-10-CM | POA: Diagnosis not present

## 2021-06-15 DIAGNOSIS — N186 End stage renal disease: Secondary | ICD-10-CM | POA: Diagnosis not present

## 2021-06-17 DIAGNOSIS — N186 End stage renal disease: Secondary | ICD-10-CM | POA: Diagnosis not present

## 2021-06-17 DIAGNOSIS — Z992 Dependence on renal dialysis: Secondary | ICD-10-CM | POA: Diagnosis not present

## 2021-06-19 DIAGNOSIS — N186 End stage renal disease: Secondary | ICD-10-CM | POA: Diagnosis not present

## 2021-06-19 DIAGNOSIS — Z992 Dependence on renal dialysis: Secondary | ICD-10-CM | POA: Diagnosis not present

## 2021-06-21 DIAGNOSIS — Z992 Dependence on renal dialysis: Secondary | ICD-10-CM | POA: Diagnosis not present

## 2021-06-21 DIAGNOSIS — N186 End stage renal disease: Secondary | ICD-10-CM | POA: Diagnosis not present

## 2021-06-21 DIAGNOSIS — I10 Essential (primary) hypertension: Secondary | ICD-10-CM | POA: Diagnosis not present

## 2021-06-22 DIAGNOSIS — N2581 Secondary hyperparathyroidism of renal origin: Secondary | ICD-10-CM | POA: Diagnosis not present

## 2021-06-22 DIAGNOSIS — N186 End stage renal disease: Secondary | ICD-10-CM | POA: Diagnosis not present

## 2021-06-22 DIAGNOSIS — Z992 Dependence on renal dialysis: Secondary | ICD-10-CM | POA: Diagnosis not present

## 2021-06-24 DIAGNOSIS — Z992 Dependence on renal dialysis: Secondary | ICD-10-CM | POA: Diagnosis not present

## 2021-06-24 DIAGNOSIS — N186 End stage renal disease: Secondary | ICD-10-CM | POA: Diagnosis not present

## 2021-06-24 NOTE — Progress Notes (Shared)
[facility] Note  06/28/2021     CHIEF COMPLAINT Patient presents for No chief complaint on file.   HISTORY OF PRESENT ILLNESS: Cindy Park is a 65 y.o. female who presents to the clinic today for:    Pt has been in the hospital twice since last visit.  She had fluid around heart and had to start dialysis.    Referring physician: Leticia Clas OD [address], [place] [id number]  HISTORICAL INFORMATION:   Selected notes from the MEDICAL RECORD NUMBER Referred by Dr. Leticia Clas for DM eval LEE: 08.19.2021 BCVA OD: 20/20 OS: 20/20 Ocular Hx- cataract, CWS, macular edema PMH- DM, HTN, stage 3 kidney failure, CHF   CURRENT MEDICATIONS: No current outpatient medications on file. (Ophthalmic Drugs)   No current facility-administered medications for this visit. (Ophthalmic Drugs)   Current Outpatient Medications (Other)  Medication Sig   acetaminophen (TYLENOL) 500 MG tablet Take 1,000 mg by mouth every 6 (six) hours as needed for moderate pain.    albuterol (PROVENTIL) (2.5 MG/3ML) 0.083% nebulizer solution Take 2.5 mg by nebulization 4 (four) times daily.    albuterol (VENTOLIN HFA) 108 (90 Base) MCG/ACT inhaler Inhale 2 puffs into the lungs every 6 (six) hours as needed for wheezing or shortness of breath.   amLODipine (NORVASC) 5 MG tablet Take 5 mg by mouth 2 (two) times daily.   aspirin EC 81 MG tablet Take 81 mg by mouth in the morning.   atorvastatin (LIPITOR) 40 MG tablet Take 40 mg by mouth at bedtime.    calcitRIOL (ROCALTROL) 0.5 MCG capsule Take 0.5 mcg by mouth in the morning.   carvedilol (COREG) 25 MG tablet Take 0.5 tablets (12.5 mg total) by mouth 2 (two) times daily with a meal.   Cholecalciferol (VITAMIN D3) 50 MCG (2000 UT) TABS Take 2,000 Units by mouth every evening.   clonazePAM (KLONOPIN) 0.5 MG tablet Take 0.5 mg by mouth 2 (two) times daily as needed for anxiety.   dicyclomine (BENTYL) 10 MG capsule Take 10  mg by mouth 2 (two) times daily.    docusate sodium (COLACE) 100 MG capsule Take 200 mg by mouth daily.   epoetin alfa (EPOGEN) 3000 UNIT/ML injection Inject 3,000 Units into the skin every 14 (fourteen) days.    epoetin alfa-epbx (RETACRIT) 60630 UNIT/ML injection 6,000 Units every 14 (fourteen) days.   ferrous sulfate 325 (65 FE) MG tablet Take 325 mg by mouth 2 (two) times daily with a meal.   Fluticasone-Umeclidin-Vilant (TRELEGY ELLIPTA) 100-62.5-25 MCG/INH AEPB Inhale 1 puff into the lungs daily.   furosemide (LASIX) 80 MG tablet Take 80 mg by mouth 2 (two) times daily.   gabapentin (NEURONTIN) 300 MG capsule Take 300 mg by mouth 2 (two) times daily.   gemfibrozil (LOPID) 600 MG tablet Take 600 mg by mouth in the morning and at bedtime.   Insulin Lispro Prot & Lispro (HUMALOG MIX 75/25 [place]) Inject 15-60 Units into the skin See admin instructions. Inject 60 units into the skin in the morning and 15 units at lunch and 60 units at supper   levothyroxine (SYNTHROID) 50 MCG tablet Take 50 mcg by mouth daily before breakfast.    montelukast (SINGULAIR) 10 MG tablet Take 10 mg by mouth at bedtime.   omeprazole (PRILOSEC) 20 MG capsule Take 20 mg by mouth every evening.   ondansetron (ZOFRAN) 4 MG tablet Take 4 mg by mouth every 8 (eight) hours  as needed for nausea or vomiting.   sevelamer carbonate (RENVELA) 800 MG tablet Take 800 mg by mouth 3 (three) times daily with meals.   sitaGLIPtin (JANUVIA) 25 MG tablet Take 25 mg by mouth in the morning.   sodium bicarbonate 650 MG tablet Take 650 mg by mouth 2 (two) times daily.   triamcinolone (KENALOG) 0.1 % Apply 1 application topically daily as needed (skin irritation). To feet   Vitamin A 2400 MCG (8000 UT) TABS Take 2,400 mcg by mouth every evening.   vitamin B-12 (CYANOCOBALAMIN) 500 MCG tablet Take 500 mcg by mouth in the morning.   vitamin E 180 MG (400 UNITS) capsule Take 400 Units by mouth daily.   No current facility-administered  medications for this visit. (Other)   REVIEW OF SYSTEMS:  ALLERGIES Allergies  Allergen Reactions   Latex Shortness Of Breath and Rash    With powder    Niacin Other (See Comments)    Burning   Other Dermatitis    Powder in the gloves    Tomato Other (See Comments)    Breaks pt out    Bactrim [Sulfamethoxazole-Trimethoprim] Swelling and Rash    PAST MEDICAL HISTORY Past Medical History:  Diagnosis Date   Anemia    Arthritis    Asthma    Cataract    Combined form OU   COPD (chronic obstructive pulmonary disease) ([place])    Diabetes mellitus without complication (HCC)    Diabetic retinopathy ([place])    BDR OU   Family history of adverse reaction to anesthesia    sister- difficult to wake up from anesthesia   Fluid excess    GERD (gastroesophageal reflux disease)    Goiter    Hypertension    Hypertensive retinopathy    OU   Renal failure    Past Surgical History:  Procedure Laterality Date   ABDOMINAL HYSTERECTOMY     AV FISTULA PLACEMENT Left 06/11/2020   Procedure: LEFT ARM ARTERIOVENOUS (AV) FISTULA CREATION;  Surgeon: [name], MD;  Location: [facility];  Service: Vascular;  Laterality: Left;   BASCILIC VEIN TRANSPOSITION Left 08/06/2020   Procedure: LEFT ARM SECOND STAGE [place];  Surgeon: [name], MD;  Location: [facility];  Service: Vascular;  Laterality: Left;   BIOPSY  12/02/2020   Procedure: BIOPSY;  Surgeon: Daneil Dolin, MD;  Location: [facility];  Service: Endoscopy;;  gastric   CHOLECYSTECTOMY     COLONOSCOPY N/A 03/25/2020   [place]: two tubular adenomas removed. next colonoscopy five years   ESOPHAGOGASTRODUODENOSCOPY N/A 12/02/2020   Procedure: ESOPHAGOGASTRODUODENOSCOPY (EGD);  Surgeon: Daneil Dolin, MD;  [facility]: [facility];  Service: Endoscopy;  Laterality: N/A;  am appt   POLYPECTOMY  03/25/2020   Procedure: POLYPECTOMY;  Surgeon: Daneil Dolin, MD;  [facility]: [facility];  Service: Endoscopy;;   POLYPECTOMY   12/02/2020   Procedure: POLYPECTOMY;  Surgeon: Daneil Dolin, MD;  [facility]: [facility];  Service: Endoscopy;;  gastric    FAMILY HISTORY Family History  Problem Relation Age of Onset   Kidney disease Mother        ESRD   Kidney disease Sister        ESRD   Colon polyps Brother 44   Kidney disease Maternal Grandmother        ESRD   Colon cancer Neg Hx     SOCIAL HISTORY Social History   Tobacco Use   Smoking status: Never   Smokeless  tobacco: Never  Vaping Use   Vaping Use: Never used  Substance Use Topics   Alcohol use: No   Drug use: No       OPHTHALMIC EXAM:  Not recorded    IMAGING AND PROCEDURES  Imaging and Procedures for 06/28/2021          ASSESSMENT/PLAN:   ICD-10-CM   1. Moderate nonproliferative diabetic retinopathy of both eyes with macular edema associated with type 2 diabetes mellitus ([place])  Q46.9629     2. Retinal edema  H35.81     3. Lattice degeneration of both retinas  H35.413     4. Retinal holes, bilateral  H33.323     5. Essential hypertension  I10     6. Hypertensive retinopathy of both eyes  H35.033     7. Combined forms of age-related cataract of both eyes  H25.813     8. Dry eyes, bilateral  H04.123     9. Moderate nonproliferative diabetic retinopathy of both eyes with macular edema associated with diabetes mellitus due to underlying condition ([place])  B28.4132     1,2. Moderate non-proliferative diabetic retinopathy w/ DME OU  **pt lost to f/u from 4.25.22 to 10.10.22 -- 5+ mos** hospitalized 2x and now on hemodialysis - Last A1c 5.8 on 6.18.22; 6.7 on 10.19.21 - s/p IVA OD #1 (02.23.22), #2 (03.25.22), #3 (04.25.22), #4 (10.10.22) - s/p IVA OS #1 (02.25.22), #2 (03.25.22), #3 (04.25.22), #4 (10.10.22) - exam shows scattered cystic changes and scattered MA/IRH  OU - FA 09.15.21 shows late leaking MA OU, no NV OU  - OCT shows OD: Interval improvement in IRF -- residual cystic changes inferotemp; OS: Interval improvement  in IRF, trace cystic changes  - BCVA 20/30 OD,down from 20/20; OS 20/30 from 20/25 - recommend IVA OU #5 today, 11.07.22 - pt wishes to proceed with injections OU  - RBA of procedure discussed, questions answered  - IVA informed consent obtained and signed, 02.23.22 (OD)  - IVA informed consent obtained and signed, 02.23.22 (OS) - see procedure note - f/u 4-6 weeks, DFE, OCT, FA transit OD, possible injection(s)  3,4. Lattice degeneration w/ atrophic holes, OU - patches of inferior lattice OU; OD with small patch at 12 - s/p laser retinopexy OD (09.29.21) -- good laser surrounding - s/p laser retinopexy OS (10.13.21) -- good laser surrounidng - monitor   5,6. Hypertensive retinopathy OU - discussed importance of tight BP control - monitor  7. Mixed Cataract OU - The symptoms of cataract, surgical options, and treatments and risks were discussed with patient. - discussed diagnosis and progression - not yet visually significant - monitor for now   8. Dry eyes OU - recommend artificial tears and lubricating ointment as needed  Ophthalmic Meds Ordered this visit:  No orders of the defined types were placed in this encounter.    No follow-ups on file.  There are no Patient Instructions on file for this visit.  This document serves as a record of services personally performed by Gardiner Sleeper, MD, PhD. It was created on their behalf by Leeann Must, [place], an ophthalmic technician. The creation of this record is the provider's dictation and/or activities during the visit.    Electronically signed by: Leeann Must, COA @TODAY @ 10:02 AM   Abbreviations: M myopia (nearsighted); A astigmatism; H hyperopia (farsighted); P presbyopia; Mrx spectacle prescription;  CTL contact lenses; OD right eye; OS left eye; OU both eyes  XT exotropia; ET esotropia; PEK punctate epithelial keratitis; PEE  punctate epithelial erosions; DES dry eye syndrome; MGD meibomian gland dysfunction; ATs  artificial tears; PFAT's preservative free artificial tears; [place] nuclear sclerotic cataract; PSC posterior subcapsular cataract; ERM epi-retinal membrane; PVD posterior vitreous detachment; RD retinal detachment; DM diabetes mellitus; DR diabetic retinopathy; NPDR non-proliferative diabetic retinopathy; PDR proliferative diabetic retinopathy; CSME clinically significant macular edema; DME diabetic macular edema; dbh dot blot hemorrhages; CWS cotton wool spot; POAG primary open angle glaucoma; C/D cup-to-disc ratio; HVF humphrey visual field; GVF goldmann visual field; OCT optical coherence tomography; IOP intraocular pressure; BRVO Branch retinal vein occlusion; CRVO central retinal vein occlusion; CRAO central retinal artery occlusion; BRAO branch retinal artery occlusion; RT retinal tear; SB scleral buckle; PPV pars plana vitrectomy; VH Vitreous hemorrhage; PRP panretinal laser photocoagulation; IVK intravitreal kenalog; VMT vitreomacular traction; MH Macular hole;  NVD neovascularization of the disc; NVE neovascularization elsewhere; AREDS age related eye disease study; ARMD age related macular degeneration; POAG primary open angle glaucoma; EBMD epithelial/anterior basement membrane dystrophy; ACIOL anterior chamber intraocular lens; IOL intraocular lens; PCIOL posterior chamber intraocular lens; Phaco/IOL phacoemulsification with intraocular lens placement; [place] photorefractive keratectomy; LASIK laser assisted in situ keratomileusis; HTN hypertension; DM diabetes mellitus; COPD chronic obstructive pulmonary disease

## 2021-06-25 IMAGING — US US THYROID
1 series · 13 of 25 positions shown · non-contrast
Comparison: None available

CLINICAL DATA: Nontoxic goiter

EXAM:
THYROID ULTRASOUND
TECHNIQUE: Ultrasound examination of the thyroid gland and adjacent soft
tissues was performed.

[Series 1: us thyroid · 0.06mm/px · 13 of 76 slices shown]
[im 1/76]
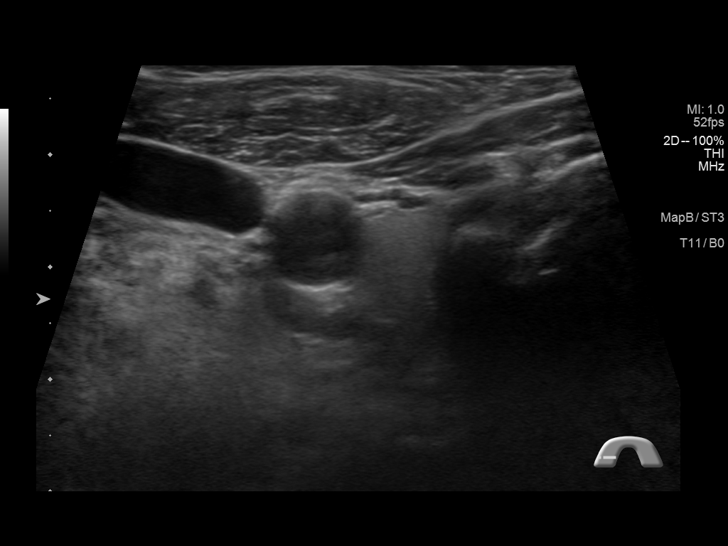
[im 7/76]
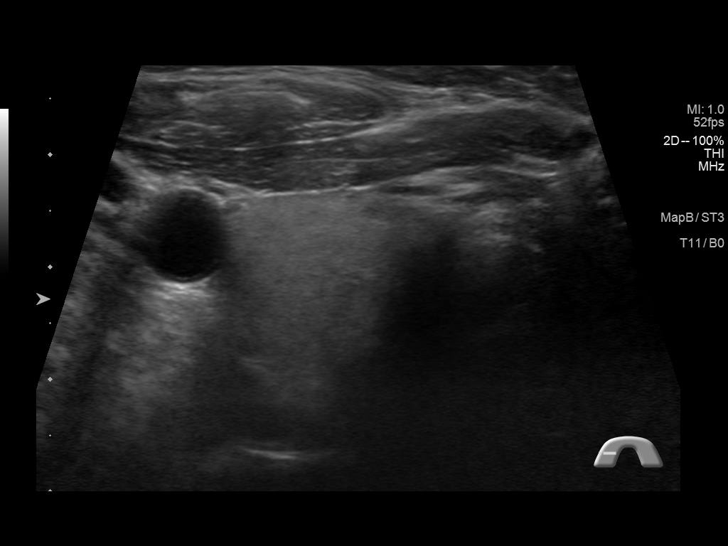
[im 13/76]
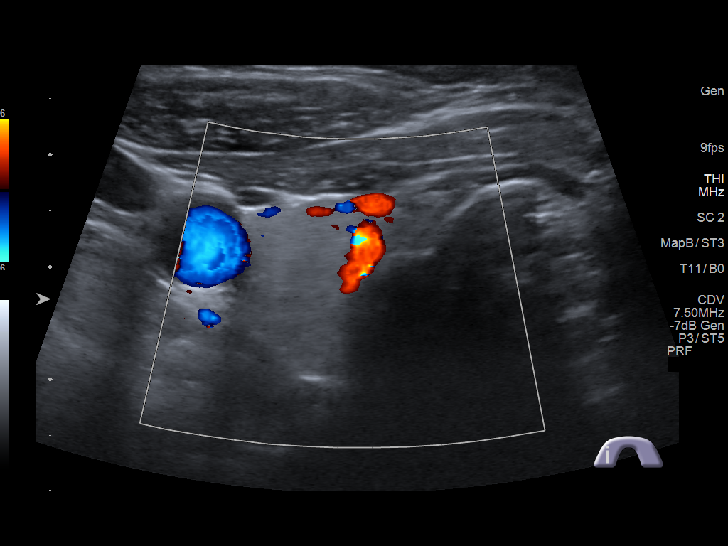
[im 19/76]
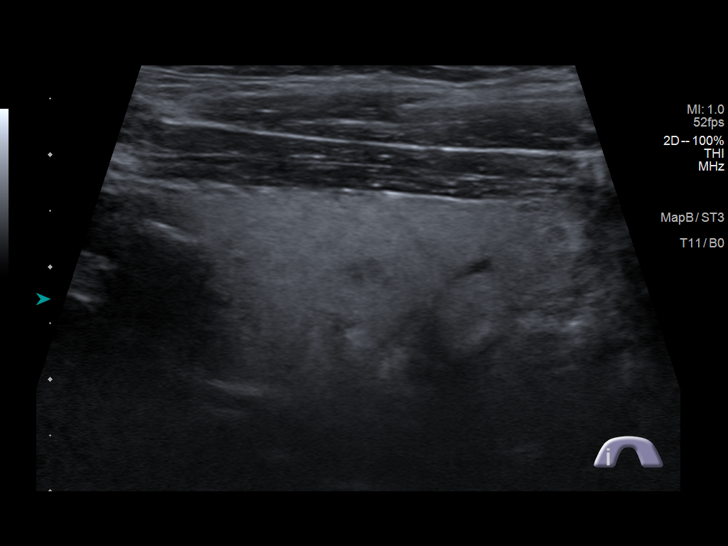
[im 26/76]
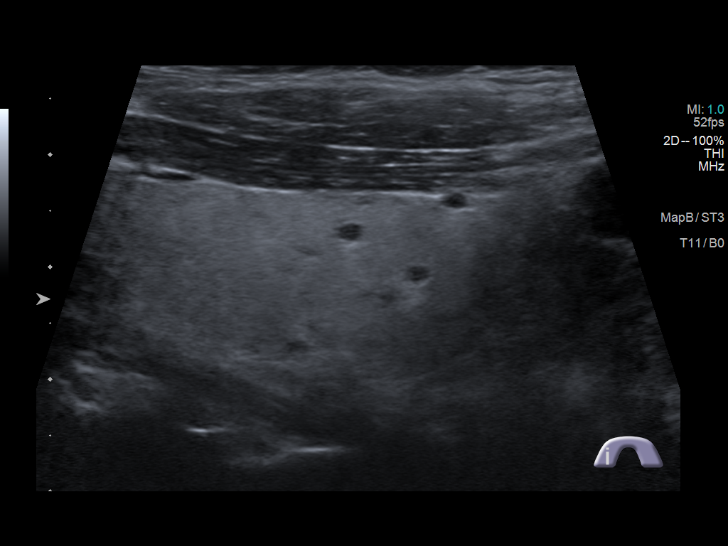
[im 32/76]
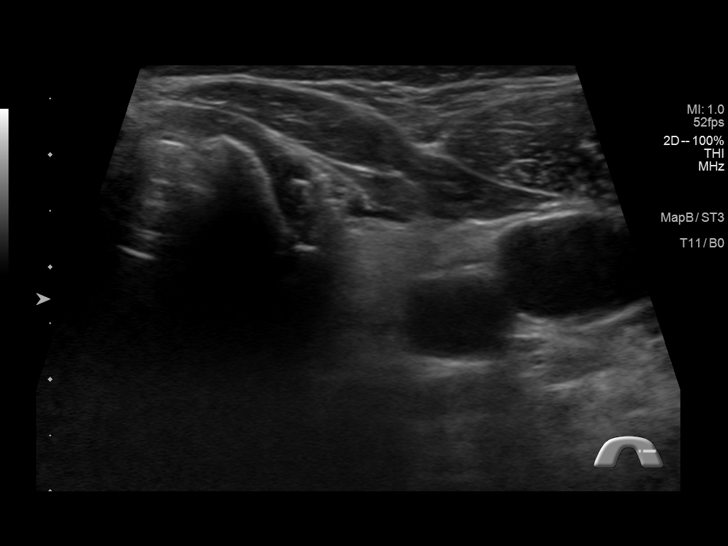
[im 38/76]
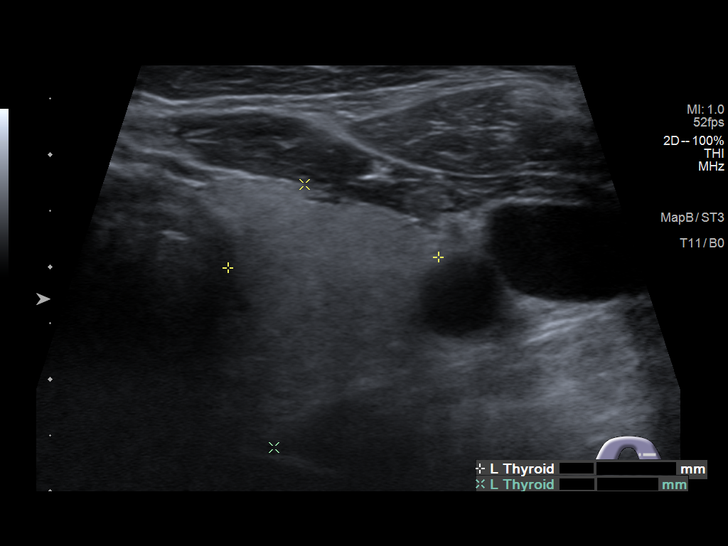
[im 44/76]
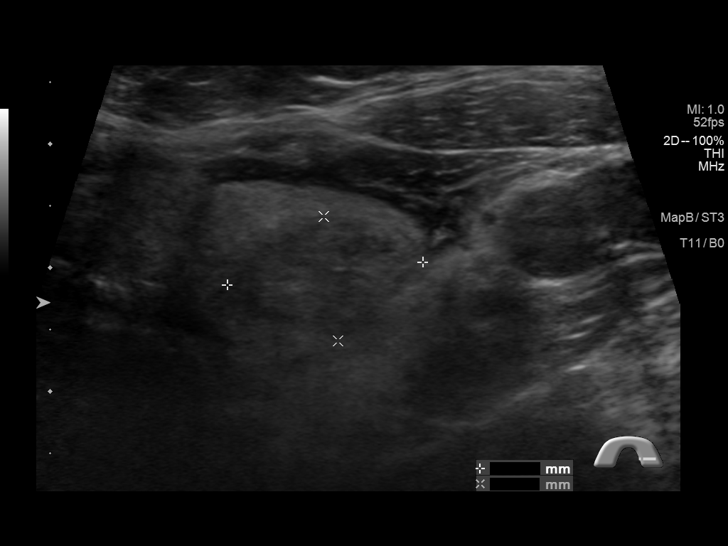
[im 51/76]
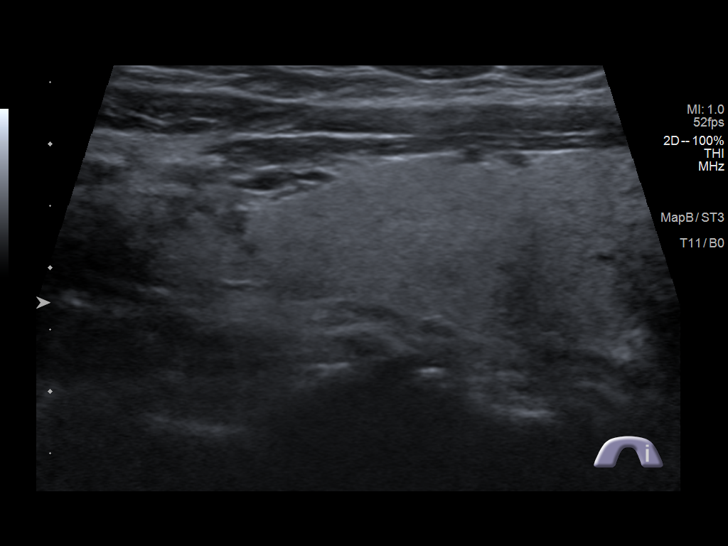
[im 57/76]
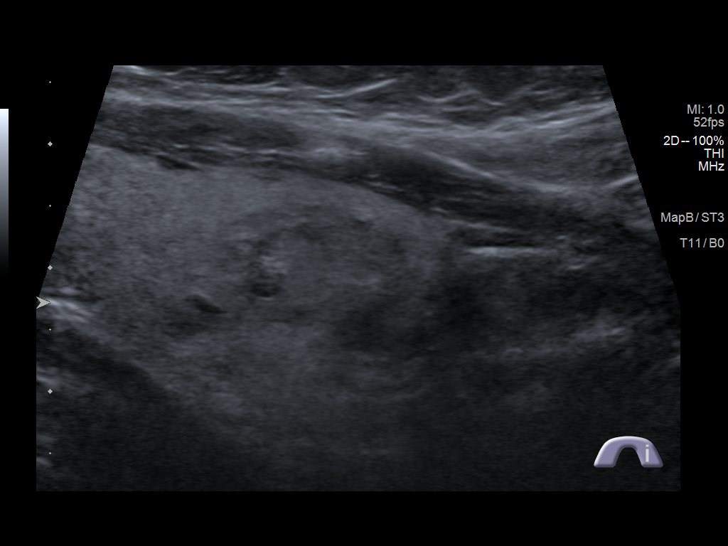
[im 63/76]
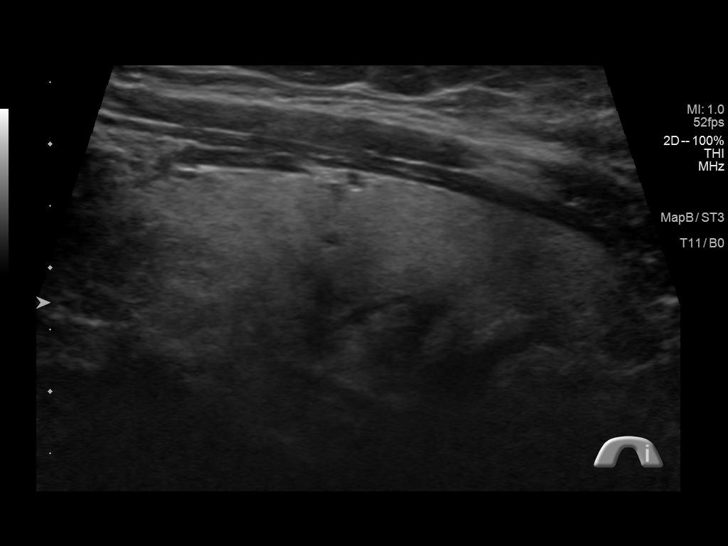
[im 69/76]
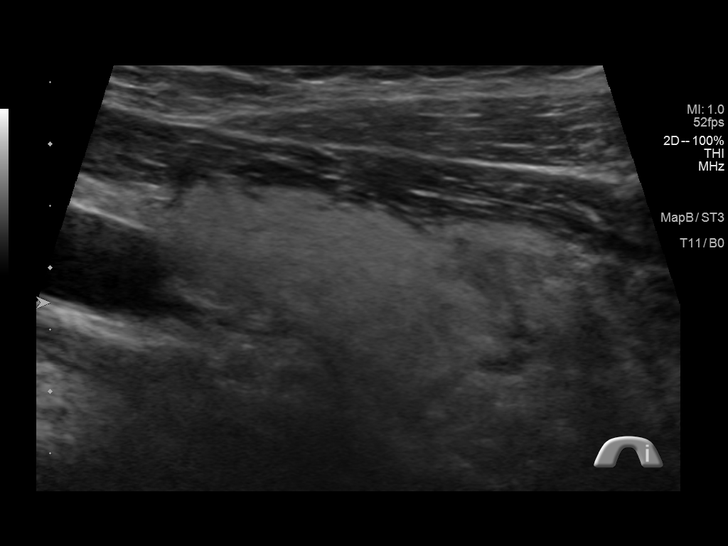
[im 76/76]
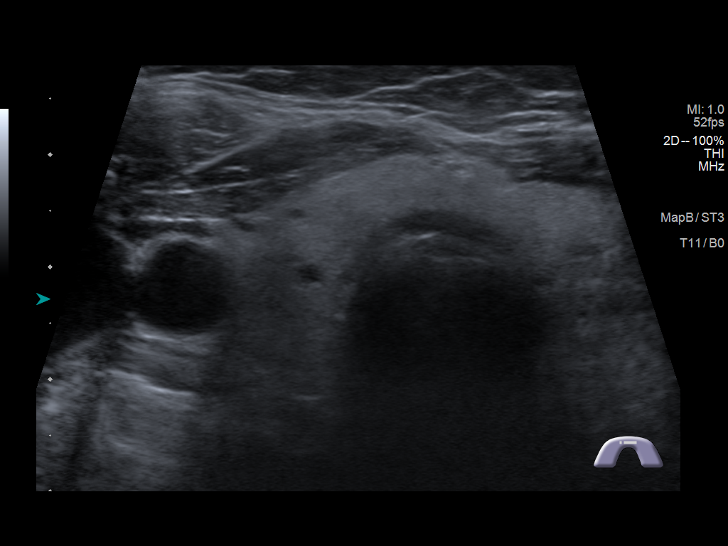

[13 of 25 positions shown; findings below may reference images not displayed]

FINDINGS: Parenchymal Echotexture: Mildly heterogenous

Isthmus: 6 mm

Right lobe: 4.5 x 2.0 x 1.5 cm

Left lobe: 5.0 x 2.3 x 1.8 cm

_________________________________________________________

Estimated total number of nodules >/= 1 cm: 1

Number of spongiform nodules >/=  2 cm not described below (TR1): 0

Number of mixed cystic and solid nodules >/= 1.5 cm not described
below (TR2): 0

_________________________________________________________

Nodule # 1:

Location: Left; Inferior

Maximum size: 1.6 cm; Other 2 dimensions: 1.4 x 1.0 cm

Composition: solid/almost completely solid (2)

Echogenicity: isoechoic (1)

Shape: not taller-than-wide (0)

Margins: ill-defined (0)

Echogenic foci: none (0)

ACR TI-RADS total points: 3.

ACR TI-RADS risk category: TR3 (3 points).

ACR TI-RADS recommendations:

*Given size (>/= 1.5 - 2.4 cm) and appearance, a follow-up
ultrasound in 1 year should be considered based on TI-RADS criteria.

_________________________________________________________

Mild thyroid heterogeneity and enlargement. No hypervascularity or
regional adenopathy.
IMPRESSION: 1.6 cm left inferior thyroid TR 3 nodule meets criteria for
follow-up in 1 year.

The above is in keeping with the ACR TI-RADS recommendations - [HOSPITAL] [id number];[DATE].

## 2021-06-26 DIAGNOSIS — N186 End stage renal disease: Secondary | ICD-10-CM | POA: Diagnosis not present

## 2021-06-26 DIAGNOSIS — Z992 Dependence on renal dialysis: Secondary | ICD-10-CM | POA: Diagnosis not present

## 2021-06-28 ENCOUNTER — Encounter (INDEPENDENT_AMBULATORY_CARE_PROVIDER_SITE_OTHER): Payer: Medicare Other | Admitting: Ophthalmology

## 2021-06-28 DIAGNOSIS — Z992 Dependence on renal dialysis: Secondary | ICD-10-CM | POA: Diagnosis not present

## 2021-06-28 DIAGNOSIS — H33323 Round hole, bilateral: Secondary | ICD-10-CM

## 2021-06-28 DIAGNOSIS — H35033 Hypertensive retinopathy, bilateral: Secondary | ICD-10-CM

## 2021-06-28 DIAGNOSIS — H3581 Retinal edema: Secondary | ICD-10-CM

## 2021-06-28 DIAGNOSIS — H35413 Lattice degeneration of retina, bilateral: Secondary | ICD-10-CM

## 2021-06-28 DIAGNOSIS — E113313 Type 2 diabetes mellitus with moderate nonproliferative diabetic retinopathy with macular edema, bilateral: Secondary | ICD-10-CM

## 2021-06-28 DIAGNOSIS — H25813 Combined forms of age-related cataract, bilateral: Secondary | ICD-10-CM

## 2021-06-28 DIAGNOSIS — I10 Essential (primary) hypertension: Secondary | ICD-10-CM

## 2021-06-28 DIAGNOSIS — E083313 Diabetes mellitus due to underlying condition with moderate nonproliferative diabetic retinopathy with macular edema, bilateral: Secondary | ICD-10-CM

## 2021-06-28 DIAGNOSIS — N186 End stage renal disease: Secondary | ICD-10-CM | POA: Diagnosis not present

## 2021-06-28 DIAGNOSIS — H04123 Dry eye syndrome of bilateral lacrimal glands: Secondary | ICD-10-CM

## 2021-06-29 DIAGNOSIS — Z992 Dependence on renal dialysis: Secondary | ICD-10-CM | POA: Diagnosis not present

## 2021-06-29 DIAGNOSIS — N186 End stage renal disease: Secondary | ICD-10-CM | POA: Diagnosis not present

## 2021-06-29 DIAGNOSIS — D509 Iron deficiency anemia, unspecified: Secondary | ICD-10-CM | POA: Diagnosis not present

## 2021-06-30 ENCOUNTER — Other Ambulatory Visit: Payer: Self-pay | Admitting: Internal Medicine

## 2021-06-30 DIAGNOSIS — Z139 Encounter for screening, unspecified: Secondary | ICD-10-CM

## 2021-06-30 DIAGNOSIS — Z992 Dependence on renal dialysis: Secondary | ICD-10-CM | POA: Diagnosis not present

## 2021-06-30 DIAGNOSIS — N186 End stage renal disease: Secondary | ICD-10-CM | POA: Diagnosis not present

## 2021-07-01 DIAGNOSIS — N186 End stage renal disease: Secondary | ICD-10-CM | POA: Diagnosis not present

## 2021-07-01 DIAGNOSIS — Z992 Dependence on renal dialysis: Secondary | ICD-10-CM | POA: Diagnosis not present

## 2021-07-02 DIAGNOSIS — N186 End stage renal disease: Secondary | ICD-10-CM | POA: Diagnosis not present

## 2021-07-02 DIAGNOSIS — Z992 Dependence on renal dialysis: Secondary | ICD-10-CM | POA: Diagnosis not present

## 2021-07-03 DIAGNOSIS — N186 End stage renal disease: Secondary | ICD-10-CM | POA: Diagnosis not present

## 2021-07-03 DIAGNOSIS — Z992 Dependence on renal dialysis: Secondary | ICD-10-CM | POA: Diagnosis not present

## 2021-07-05 DIAGNOSIS — N186 End stage renal disease: Secondary | ICD-10-CM | POA: Diagnosis not present

## 2021-07-05 DIAGNOSIS — Z992 Dependence on renal dialysis: Secondary | ICD-10-CM | POA: Diagnosis not present

## 2021-07-08 DIAGNOSIS — N186 End stage renal disease: Secondary | ICD-10-CM | POA: Diagnosis not present

## 2021-07-08 DIAGNOSIS — Z992 Dependence on renal dialysis: Secondary | ICD-10-CM | POA: Diagnosis not present

## 2021-07-09 DIAGNOSIS — N186 End stage renal disease: Secondary | ICD-10-CM | POA: Diagnosis not present

## 2021-07-09 DIAGNOSIS — Z992 Dependence on renal dialysis: Secondary | ICD-10-CM | POA: Diagnosis not present

## 2021-07-12 DIAGNOSIS — N186 End stage renal disease: Secondary | ICD-10-CM | POA: Diagnosis not present

## 2021-07-12 DIAGNOSIS — E1165 Type 2 diabetes mellitus with hyperglycemia: Secondary | ICD-10-CM | POA: Diagnosis not present

## 2021-07-12 DIAGNOSIS — I10 Essential (primary) hypertension: Secondary | ICD-10-CM | POA: Diagnosis not present

## 2021-07-12 DIAGNOSIS — I5032 Chronic diastolic (congestive) heart failure: Secondary | ICD-10-CM | POA: Diagnosis not present

## 2021-07-12 DIAGNOSIS — Z992 Dependence on renal dialysis: Secondary | ICD-10-CM | POA: Diagnosis not present

## 2021-07-12 DIAGNOSIS — M7050 Other bursitis of knee, unspecified knee: Secondary | ICD-10-CM | POA: Diagnosis not present

## 2021-07-12 DIAGNOSIS — Z299 Encounter for prophylactic measures, unspecified: Secondary | ICD-10-CM | POA: Diagnosis not present

## 2021-07-12 DIAGNOSIS — M179 Osteoarthritis of knee, unspecified: Secondary | ICD-10-CM | POA: Diagnosis not present

## 2021-07-13 DIAGNOSIS — Z4902 Encounter for fitting and adjustment of peritoneal dialysis catheter: Secondary | ICD-10-CM | POA: Diagnosis not present

## 2021-07-13 DIAGNOSIS — N186 End stage renal disease: Secondary | ICD-10-CM | POA: Diagnosis not present

## 2021-07-13 DIAGNOSIS — Z992 Dependence on renal dialysis: Secondary | ICD-10-CM | POA: Diagnosis not present

## 2021-07-13 DIAGNOSIS — E1122 Type 2 diabetes mellitus with diabetic chronic kidney disease: Secondary | ICD-10-CM | POA: Diagnosis not present

## 2021-07-13 DIAGNOSIS — I120 Hypertensive chronic kidney disease with stage 5 chronic kidney disease or end stage renal disease: Secondary | ICD-10-CM | POA: Diagnosis not present

## 2021-07-13 DIAGNOSIS — Z794 Long term (current) use of insulin: Secondary | ICD-10-CM | POA: Diagnosis not present

## 2021-07-15 DIAGNOSIS — Z992 Dependence on renal dialysis: Secondary | ICD-10-CM | POA: Diagnosis not present

## 2021-07-15 DIAGNOSIS — N186 End stage renal disease: Secondary | ICD-10-CM | POA: Diagnosis not present

## 2021-07-16 DIAGNOSIS — Z299 Encounter for prophylactic measures, unspecified: Secondary | ICD-10-CM | POA: Diagnosis not present

## 2021-07-16 DIAGNOSIS — M7052 Other bursitis of knee, left knee: Secondary | ICD-10-CM | POA: Diagnosis not present

## 2021-07-16 DIAGNOSIS — M79605 Pain in left leg: Secondary | ICD-10-CM | POA: Diagnosis not present

## 2021-07-17 DIAGNOSIS — Z992 Dependence on renal dialysis: Secondary | ICD-10-CM | POA: Diagnosis not present

## 2021-07-17 DIAGNOSIS — N186 End stage renal disease: Secondary | ICD-10-CM | POA: Diagnosis not present

## 2021-07-20 DIAGNOSIS — N186 End stage renal disease: Secondary | ICD-10-CM | POA: Diagnosis not present

## 2021-07-20 DIAGNOSIS — Z992 Dependence on renal dialysis: Secondary | ICD-10-CM | POA: Diagnosis not present

## 2021-07-21 DIAGNOSIS — J449 Chronic obstructive pulmonary disease, unspecified: Secondary | ICD-10-CM | POA: Diagnosis not present

## 2021-07-21 DIAGNOSIS — T85691A Other mechanical complication of intraperitoneal dialysis catheter, initial encounter: Secondary | ICD-10-CM | POA: Diagnosis not present

## 2021-07-21 DIAGNOSIS — E785 Hyperlipidemia, unspecified: Secondary | ICD-10-CM | POA: Diagnosis not present

## 2021-07-21 DIAGNOSIS — J4520 Mild intermittent asthma, uncomplicated: Secondary | ICD-10-CM | POA: Diagnosis not present

## 2021-07-21 DIAGNOSIS — E213 Hyperparathyroidism, unspecified: Secondary | ICD-10-CM | POA: Diagnosis not present

## 2021-07-21 DIAGNOSIS — E875 Hyperkalemia: Secondary | ICD-10-CM | POA: Diagnosis not present

## 2021-07-21 DIAGNOSIS — Z992 Dependence on renal dialysis: Secondary | ICD-10-CM | POA: Diagnosis not present

## 2021-07-21 DIAGNOSIS — N994 Postprocedural pelvic peritoneal adhesions: Secondary | ICD-10-CM | POA: Diagnosis not present

## 2021-07-21 DIAGNOSIS — I132 Hypertensive heart and chronic kidney disease with heart failure and with stage 5 chronic kidney disease, or end stage renal disease: Secondary | ICD-10-CM | POA: Diagnosis not present

## 2021-07-21 DIAGNOSIS — Z79899 Other long term (current) drug therapy: Secondary | ICD-10-CM | POA: Diagnosis not present

## 2021-07-21 DIAGNOSIS — D631 Anemia in chronic kidney disease: Secondary | ICD-10-CM | POA: Diagnosis not present

## 2021-07-21 DIAGNOSIS — K660 Peritoneal adhesions (postprocedural) (postinfection): Secondary | ICD-10-CM | POA: Diagnosis not present

## 2021-07-21 DIAGNOSIS — Z87891 Personal history of nicotine dependence: Secondary | ICD-10-CM | POA: Diagnosis not present

## 2021-07-21 DIAGNOSIS — T85611A Breakdown (mechanical) of intraperitoneal dialysis catheter, initial encounter: Secondary | ICD-10-CM | POA: Diagnosis not present

## 2021-07-21 DIAGNOSIS — I10 Essential (primary) hypertension: Secondary | ICD-10-CM | POA: Diagnosis not present

## 2021-07-21 DIAGNOSIS — D508 Other iron deficiency anemias: Secondary | ICD-10-CM | POA: Diagnosis not present

## 2021-07-21 DIAGNOSIS — Z7982 Long term (current) use of aspirin: Secondary | ICD-10-CM | POA: Diagnosis not present

## 2021-07-21 DIAGNOSIS — Z20822 Contact with and (suspected) exposure to covid-19: Secondary | ICD-10-CM | POA: Diagnosis not present

## 2021-07-21 DIAGNOSIS — E1142 Type 2 diabetes mellitus with diabetic polyneuropathy: Secondary | ICD-10-CM | POA: Diagnosis not present

## 2021-07-21 DIAGNOSIS — K581 Irritable bowel syndrome with constipation: Secondary | ICD-10-CM | POA: Diagnosis not present

## 2021-07-21 DIAGNOSIS — N186 End stage renal disease: Secondary | ICD-10-CM | POA: Diagnosis not present

## 2021-07-21 DIAGNOSIS — K5651 Intestinal adhesions [bands], with partial obstruction: Secondary | ICD-10-CM | POA: Diagnosis not present

## 2021-07-21 DIAGNOSIS — K219 Gastro-esophageal reflux disease without esophagitis: Secondary | ICD-10-CM | POA: Diagnosis not present

## 2021-07-21 DIAGNOSIS — Z794 Long term (current) use of insulin: Secondary | ICD-10-CM | POA: Diagnosis not present

## 2021-07-21 DIAGNOSIS — I5032 Chronic diastolic (congestive) heart failure: Secondary | ICD-10-CM | POA: Diagnosis not present

## 2021-07-21 DIAGNOSIS — E1122 Type 2 diabetes mellitus with diabetic chronic kidney disease: Secondary | ICD-10-CM | POA: Diagnosis not present

## 2021-07-27 DIAGNOSIS — N186 End stage renal disease: Secondary | ICD-10-CM | POA: Diagnosis not present

## 2021-07-27 DIAGNOSIS — Z992 Dependence on renal dialysis: Secondary | ICD-10-CM | POA: Diagnosis not present

## 2021-07-29 DIAGNOSIS — N186 End stage renal disease: Secondary | ICD-10-CM | POA: Diagnosis not present

## 2021-07-29 DIAGNOSIS — Z992 Dependence on renal dialysis: Secondary | ICD-10-CM | POA: Diagnosis not present

## 2021-07-31 DIAGNOSIS — N186 End stage renal disease: Secondary | ICD-10-CM | POA: Diagnosis not present

## 2021-07-31 DIAGNOSIS — Z992 Dependence on renal dialysis: Secondary | ICD-10-CM | POA: Diagnosis not present

## 2021-08-02 ENCOUNTER — Ambulatory Visit
Admission: RE | Admit: 2021-08-02 | Discharge: 2021-08-02 | Disposition: A | Discharge status: Home / Self Care | Payer: Medicare Other | Source: Ambulatory Visit | Attending: Internal Medicine | Admitting: Internal Medicine

## 2021-08-02 ENCOUNTER — Ambulatory Visit: Payer: Medicare Other | Admitting: Gastroenterology

## 2021-08-02 DIAGNOSIS — Z139 Encounter for screening, unspecified: Secondary | ICD-10-CM

## 2021-08-02 DIAGNOSIS — Z1231 Encounter for screening mammogram for malignant neoplasm of breast: Secondary | ICD-10-CM | POA: Diagnosis not present

## 2021-08-03 DIAGNOSIS — N186 End stage renal disease: Secondary | ICD-10-CM | POA: Diagnosis not present

## 2021-08-03 DIAGNOSIS — Z992 Dependence on renal dialysis: Secondary | ICD-10-CM | POA: Diagnosis not present

## 2021-08-05 DIAGNOSIS — N186 End stage renal disease: Secondary | ICD-10-CM | POA: Diagnosis not present

## 2021-08-05 DIAGNOSIS — Z992 Dependence on renal dialysis: Secondary | ICD-10-CM | POA: Diagnosis not present

## 2021-08-10 DIAGNOSIS — N2581 Secondary hyperparathyroidism of renal origin: Secondary | ICD-10-CM | POA: Diagnosis not present

## 2021-08-10 DIAGNOSIS — Z299 Encounter for prophylactic measures, unspecified: Secondary | ICD-10-CM | POA: Diagnosis not present

## 2021-08-10 DIAGNOSIS — R109 Unspecified abdominal pain: Secondary | ICD-10-CM | POA: Diagnosis not present

## 2021-08-10 DIAGNOSIS — M25562 Pain in left knee: Secondary | ICD-10-CM | POA: Diagnosis not present

## 2021-08-10 DIAGNOSIS — I10 Essential (primary) hypertension: Secondary | ICD-10-CM | POA: Diagnosis not present

## 2021-08-10 DIAGNOSIS — Z91040 Latex allergy status: Secondary | ICD-10-CM | POA: Diagnosis not present

## 2021-08-10 DIAGNOSIS — Z882 Allergy status to sulfonamides status: Secondary | ICD-10-CM | POA: Diagnosis not present

## 2021-08-10 DIAGNOSIS — N186 End stage renal disease: Secondary | ICD-10-CM | POA: Diagnosis not present

## 2021-08-10 DIAGNOSIS — Z992 Dependence on renal dialysis: Secondary | ICD-10-CM | POA: Diagnosis not present

## 2021-08-10 DIAGNOSIS — T82838A Hemorrhage of vascular prosthetic devices, implants and grafts, initial encounter: Secondary | ICD-10-CM | POA: Diagnosis not present

## 2021-08-12 DIAGNOSIS — N186 End stage renal disease: Secondary | ICD-10-CM | POA: Diagnosis not present

## 2021-08-12 DIAGNOSIS — Z992 Dependence on renal dialysis: Secondary | ICD-10-CM | POA: Diagnosis not present

## 2021-08-14 DIAGNOSIS — Z992 Dependence on renal dialysis: Secondary | ICD-10-CM | POA: Diagnosis not present

## 2021-08-14 DIAGNOSIS — N186 End stage renal disease: Secondary | ICD-10-CM | POA: Diagnosis not present

## 2021-08-17 DIAGNOSIS — N186 End stage renal disease: Secondary | ICD-10-CM | POA: Diagnosis not present

## 2021-08-17 DIAGNOSIS — Z794 Long term (current) use of insulin: Secondary | ICD-10-CM | POA: Diagnosis not present

## 2021-08-17 DIAGNOSIS — E119 Type 2 diabetes mellitus without complications: Secondary | ICD-10-CM | POA: Diagnosis not present

## 2021-08-17 DIAGNOSIS — D509 Iron deficiency anemia, unspecified: Secondary | ICD-10-CM | POA: Diagnosis not present

## 2021-08-17 DIAGNOSIS — Z992 Dependence on renal dialysis: Secondary | ICD-10-CM | POA: Diagnosis not present

## 2021-08-18 DIAGNOSIS — T82590A Other mechanical complication of surgically created arteriovenous fistula, initial encounter: Secondary | ICD-10-CM | POA: Diagnosis not present

## 2021-08-18 DIAGNOSIS — I10 Essential (primary) hypertension: Secondary | ICD-10-CM | POA: Diagnosis not present

## 2021-08-18 DIAGNOSIS — E1165 Type 2 diabetes mellitus with hyperglycemia: Secondary | ICD-10-CM | POA: Diagnosis not present

## 2021-08-18 DIAGNOSIS — E1122 Type 2 diabetes mellitus with diabetic chronic kidney disease: Secondary | ICD-10-CM | POA: Diagnosis not present

## 2021-08-18 DIAGNOSIS — Z299 Encounter for prophylactic measures, unspecified: Secondary | ICD-10-CM | POA: Diagnosis not present

## 2021-08-18 DIAGNOSIS — G4700 Insomnia, unspecified: Secondary | ICD-10-CM | POA: Diagnosis not present

## 2021-08-19 DIAGNOSIS — N186 End stage renal disease: Secondary | ICD-10-CM | POA: Diagnosis not present

## 2021-08-19 DIAGNOSIS — Z992 Dependence on renal dialysis: Secondary | ICD-10-CM | POA: Diagnosis not present

## 2021-08-20 DIAGNOSIS — E119 Type 2 diabetes mellitus without complications: Secondary | ICD-10-CM | POA: Diagnosis not present

## 2021-08-20 DIAGNOSIS — R0600 Dyspnea, unspecified: Secondary | ICD-10-CM | POA: Diagnosis not present

## 2021-08-20 DIAGNOSIS — R079 Chest pain, unspecified: Secondary | ICD-10-CM | POA: Diagnosis not present

## 2021-08-20 DIAGNOSIS — I5032 Chronic diastolic (congestive) heart failure: Secondary | ICD-10-CM | POA: Diagnosis not present

## 2021-08-20 DIAGNOSIS — J449 Chronic obstructive pulmonary disease, unspecified: Secondary | ICD-10-CM | POA: Diagnosis not present

## 2021-08-20 DIAGNOSIS — E7800 Pure hypercholesterolemia, unspecified: Secondary | ICD-10-CM | POA: Diagnosis not present

## 2021-08-20 DIAGNOSIS — I10 Essential (primary) hypertension: Secondary | ICD-10-CM | POA: Diagnosis not present

## 2021-08-20 DIAGNOSIS — M25562 Pain in left knee: Secondary | ICD-10-CM | POA: Diagnosis not present

## 2021-08-20 DIAGNOSIS — R0789 Other chest pain: Secondary | ICD-10-CM | POA: Diagnosis not present

## 2021-08-21 DIAGNOSIS — N186 End stage renal disease: Secondary | ICD-10-CM | POA: Diagnosis not present

## 2021-08-21 DIAGNOSIS — Z992 Dependence on renal dialysis: Secondary | ICD-10-CM | POA: Diagnosis not present

## 2021-08-24 DIAGNOSIS — Z992 Dependence on renal dialysis: Secondary | ICD-10-CM | POA: Diagnosis not present

## 2021-08-24 DIAGNOSIS — Z23 Encounter for immunization: Secondary | ICD-10-CM | POA: Diagnosis not present

## 2021-08-24 DIAGNOSIS — N186 End stage renal disease: Secondary | ICD-10-CM | POA: Diagnosis not present

## 2021-08-26 DIAGNOSIS — N186 End stage renal disease: Secondary | ICD-10-CM | POA: Diagnosis not present

## 2021-08-26 DIAGNOSIS — Z992 Dependence on renal dialysis: Secondary | ICD-10-CM | POA: Diagnosis not present

## 2021-08-26 DIAGNOSIS — Z23 Encounter for immunization: Secondary | ICD-10-CM | POA: Diagnosis not present

## 2021-08-28 DIAGNOSIS — N186 End stage renal disease: Secondary | ICD-10-CM | POA: Diagnosis not present

## 2021-08-28 DIAGNOSIS — Z992 Dependence on renal dialysis: Secondary | ICD-10-CM | POA: Diagnosis not present

## 2021-08-28 DIAGNOSIS — Z23 Encounter for immunization: Secondary | ICD-10-CM | POA: Diagnosis not present

## 2021-08-30 DIAGNOSIS — M1712 Unilateral primary osteoarthritis, left knee: Secondary | ICD-10-CM | POA: Diagnosis not present

## 2021-08-30 DIAGNOSIS — S83242A Other tear of medial meniscus, current injury, left knee, initial encounter: Secondary | ICD-10-CM | POA: Diagnosis not present

## 2021-08-30 DIAGNOSIS — S83412A Sprain of medial collateral ligament of left knee, initial encounter: Secondary | ICD-10-CM | POA: Diagnosis not present

## 2021-08-30 DIAGNOSIS — M25462 Effusion, left knee: Secondary | ICD-10-CM | POA: Diagnosis not present

## 2021-08-31 DIAGNOSIS — Z23 Encounter for immunization: Secondary | ICD-10-CM | POA: Diagnosis not present

## 2021-08-31 DIAGNOSIS — Z992 Dependence on renal dialysis: Secondary | ICD-10-CM | POA: Diagnosis not present

## 2021-08-31 DIAGNOSIS — N186 End stage renal disease: Secondary | ICD-10-CM | POA: Diagnosis not present

## 2021-09-02 DIAGNOSIS — N186 End stage renal disease: Secondary | ICD-10-CM | POA: Diagnosis not present

## 2021-09-02 DIAGNOSIS — Z23 Encounter for immunization: Secondary | ICD-10-CM | POA: Diagnosis not present

## 2021-09-02 DIAGNOSIS — Z992 Dependence on renal dialysis: Secondary | ICD-10-CM | POA: Diagnosis not present

## 2021-09-03 DIAGNOSIS — M25562 Pain in left knee: Secondary | ICD-10-CM | POA: Diagnosis not present

## 2021-09-04 DIAGNOSIS — Z23 Encounter for immunization: Secondary | ICD-10-CM | POA: Diagnosis not present

## 2021-09-04 DIAGNOSIS — Z992 Dependence on renal dialysis: Secondary | ICD-10-CM | POA: Diagnosis not present

## 2021-09-04 DIAGNOSIS — N186 End stage renal disease: Secondary | ICD-10-CM | POA: Diagnosis not present

## 2021-09-07 DIAGNOSIS — N186 End stage renal disease: Secondary | ICD-10-CM | POA: Diagnosis not present

## 2021-09-07 DIAGNOSIS — Z992 Dependence on renal dialysis: Secondary | ICD-10-CM | POA: Diagnosis not present

## 2021-09-07 DIAGNOSIS — Z23 Encounter for immunization: Secondary | ICD-10-CM | POA: Diagnosis not present

## 2021-09-09 DIAGNOSIS — Z992 Dependence on renal dialysis: Secondary | ICD-10-CM | POA: Diagnosis not present

## 2021-09-09 DIAGNOSIS — N186 End stage renal disease: Secondary | ICD-10-CM | POA: Diagnosis not present

## 2021-09-09 DIAGNOSIS — Z23 Encounter for immunization: Secondary | ICD-10-CM | POA: Diagnosis not present

## 2021-09-11 DIAGNOSIS — Z23 Encounter for immunization: Secondary | ICD-10-CM | POA: Diagnosis not present

## 2021-09-11 DIAGNOSIS — N186 End stage renal disease: Secondary | ICD-10-CM | POA: Diagnosis not present

## 2021-09-11 DIAGNOSIS — Z992 Dependence on renal dialysis: Secondary | ICD-10-CM | POA: Diagnosis not present

## 2021-09-14 DIAGNOSIS — N186 End stage renal disease: Secondary | ICD-10-CM | POA: Diagnosis not present

## 2021-09-14 DIAGNOSIS — D509 Iron deficiency anemia, unspecified: Secondary | ICD-10-CM | POA: Diagnosis not present

## 2021-09-14 DIAGNOSIS — Z992 Dependence on renal dialysis: Secondary | ICD-10-CM | POA: Diagnosis not present

## 2021-09-14 DIAGNOSIS — Z23 Encounter for immunization: Secondary | ICD-10-CM | POA: Diagnosis not present

## 2021-09-16 DIAGNOSIS — N186 End stage renal disease: Secondary | ICD-10-CM | POA: Diagnosis not present

## 2021-09-16 DIAGNOSIS — Z992 Dependence on renal dialysis: Secondary | ICD-10-CM | POA: Diagnosis not present

## 2021-09-16 DIAGNOSIS — Z23 Encounter for immunization: Secondary | ICD-10-CM | POA: Diagnosis not present

## 2021-09-18 DIAGNOSIS — Z992 Dependence on renal dialysis: Secondary | ICD-10-CM | POA: Diagnosis not present

## 2021-09-18 DIAGNOSIS — Z23 Encounter for immunization: Secondary | ICD-10-CM | POA: Diagnosis not present

## 2021-09-18 DIAGNOSIS — N186 End stage renal disease: Secondary | ICD-10-CM | POA: Diagnosis not present

## 2021-09-19 DIAGNOSIS — I10 Essential (primary) hypertension: Secondary | ICD-10-CM | POA: Diagnosis not present

## 2021-09-21 DIAGNOSIS — E119 Type 2 diabetes mellitus without complications: Secondary | ICD-10-CM | POA: Diagnosis not present

## 2021-09-21 DIAGNOSIS — J449 Chronic obstructive pulmonary disease, unspecified: Secondary | ICD-10-CM | POA: Diagnosis not present

## 2021-09-21 DIAGNOSIS — E7800 Pure hypercholesterolemia, unspecified: Secondary | ICD-10-CM | POA: Diagnosis not present

## 2021-09-21 DIAGNOSIS — Z992 Dependence on renal dialysis: Secondary | ICD-10-CM | POA: Diagnosis not present

## 2021-09-21 DIAGNOSIS — Z23 Encounter for immunization: Secondary | ICD-10-CM | POA: Diagnosis not present

## 2021-09-21 DIAGNOSIS — N186 End stage renal disease: Secondary | ICD-10-CM | POA: Diagnosis not present

## 2021-09-23 DIAGNOSIS — Z992 Dependence on renal dialysis: Secondary | ICD-10-CM | POA: Diagnosis not present

## 2021-09-23 DIAGNOSIS — N186 End stage renal disease: Secondary | ICD-10-CM | POA: Diagnosis not present

## 2021-09-24 DIAGNOSIS — M1711 Unilateral primary osteoarthritis, right knee: Secondary | ICD-10-CM | POA: Diagnosis not present

## 2021-09-25 DIAGNOSIS — N186 End stage renal disease: Secondary | ICD-10-CM | POA: Diagnosis not present

## 2021-09-25 DIAGNOSIS — Z992 Dependence on renal dialysis: Secondary | ICD-10-CM | POA: Diagnosis not present

## 2021-09-28 DIAGNOSIS — N186 End stage renal disease: Secondary | ICD-10-CM | POA: Diagnosis not present

## 2021-09-28 DIAGNOSIS — Z992 Dependence on renal dialysis: Secondary | ICD-10-CM | POA: Diagnosis not present

## 2021-09-30 DIAGNOSIS — Z992 Dependence on renal dialysis: Secondary | ICD-10-CM | POA: Diagnosis not present

## 2021-09-30 DIAGNOSIS — N186 End stage renal disease: Secondary | ICD-10-CM | POA: Diagnosis not present

## 2021-10-01 DIAGNOSIS — M1711 Unilateral primary osteoarthritis, right knee: Secondary | ICD-10-CM | POA: Diagnosis not present

## 2021-10-02 DIAGNOSIS — N186 End stage renal disease: Secondary | ICD-10-CM | POA: Diagnosis not present

## 2021-10-02 DIAGNOSIS — Z992 Dependence on renal dialysis: Secondary | ICD-10-CM | POA: Diagnosis not present

## 2021-10-05 DIAGNOSIS — N186 End stage renal disease: Secondary | ICD-10-CM | POA: Diagnosis not present

## 2021-10-05 DIAGNOSIS — Z992 Dependence on renal dialysis: Secondary | ICD-10-CM | POA: Diagnosis not present

## 2021-10-07 DIAGNOSIS — Z992 Dependence on renal dialysis: Secondary | ICD-10-CM | POA: Diagnosis not present

## 2021-10-07 DIAGNOSIS — N186 End stage renal disease: Secondary | ICD-10-CM | POA: Diagnosis not present

## 2021-10-08 DIAGNOSIS — M1711 Unilateral primary osteoarthritis, right knee: Secondary | ICD-10-CM | POA: Diagnosis not present

## 2021-10-09 DIAGNOSIS — N186 End stage renal disease: Secondary | ICD-10-CM | POA: Diagnosis not present

## 2021-10-09 DIAGNOSIS — Z992 Dependence on renal dialysis: Secondary | ICD-10-CM | POA: Diagnosis not present

## 2021-10-12 DIAGNOSIS — N2581 Secondary hyperparathyroidism of renal origin: Secondary | ICD-10-CM | POA: Diagnosis not present

## 2021-10-12 DIAGNOSIS — N186 End stage renal disease: Secondary | ICD-10-CM | POA: Diagnosis not present

## 2021-10-12 DIAGNOSIS — Z992 Dependence on renal dialysis: Secondary | ICD-10-CM | POA: Diagnosis not present

## 2021-10-14 DIAGNOSIS — N186 End stage renal disease: Secondary | ICD-10-CM | POA: Diagnosis not present

## 2021-10-14 DIAGNOSIS — Z992 Dependence on renal dialysis: Secondary | ICD-10-CM | POA: Diagnosis not present

## 2021-10-16 ENCOUNTER — Emergency Department (HOSPITAL_COMMUNITY): Payer: Medicare Other

## 2021-10-16 ENCOUNTER — Emergency Department (HOSPITAL_COMMUNITY)
Admission: EM | Admit: 2021-10-16 | Discharge: 2021-10-16 | Disposition: A | Discharge status: Home / Self Care | Payer: Medicare Other | Attending: Emergency Medicine | Admitting: Emergency Medicine

## 2021-10-16 ENCOUNTER — Encounter (HOSPITAL_COMMUNITY): Payer: Self-pay | Admitting: Emergency Medicine

## 2021-10-16 ENCOUNTER — Other Ambulatory Visit: Payer: Self-pay

## 2021-10-16 ENCOUNTER — Emergency Department (HOSPITAL_BASED_OUTPATIENT_CLINIC_OR_DEPARTMENT_OTHER): Payer: Medicare Other

## 2021-10-16 DIAGNOSIS — Z91040 Latex allergy status: Secondary | ICD-10-CM | POA: Diagnosis not present

## 2021-10-16 DIAGNOSIS — E119 Type 2 diabetes mellitus without complications: Secondary | ICD-10-CM | POA: Insufficient documentation

## 2021-10-16 DIAGNOSIS — Z7984 Long term (current) use of oral hypoglycemic drugs: Secondary | ICD-10-CM | POA: Diagnosis not present

## 2021-10-16 DIAGNOSIS — Z7951 Long term (current) use of inhaled steroids: Secondary | ICD-10-CM | POA: Insufficient documentation

## 2021-10-16 DIAGNOSIS — R600 Localized edema: Secondary | ICD-10-CM | POA: Diagnosis not present

## 2021-10-16 DIAGNOSIS — Z794 Long term (current) use of insulin: Secondary | ICD-10-CM | POA: Insufficient documentation

## 2021-10-16 DIAGNOSIS — I10 Essential (primary) hypertension: Secondary | ICD-10-CM | POA: Diagnosis not present

## 2021-10-16 DIAGNOSIS — M25562 Pain in left knee: Secondary | ICD-10-CM

## 2021-10-16 DIAGNOSIS — J45909 Unspecified asthma, uncomplicated: Secondary | ICD-10-CM | POA: Insufficient documentation

## 2021-10-16 DIAGNOSIS — M7989 Other specified soft tissue disorders: Secondary | ICD-10-CM

## 2021-10-16 DIAGNOSIS — Z7982 Long term (current) use of aspirin: Secondary | ICD-10-CM | POA: Insufficient documentation

## 2021-10-16 DIAGNOSIS — J449 Chronic obstructive pulmonary disease, unspecified: Secondary | ICD-10-CM | POA: Diagnosis not present

## 2021-10-16 DIAGNOSIS — Z79899 Other long term (current) drug therapy: Secondary | ICD-10-CM | POA: Insufficient documentation

## 2021-10-16 LAB — CBC WITH DIFFERENTIAL/PLATELET
Abs Immature Granulocytes: 0.04 10*3/uL (ref 0.00–0.07)
Basophils Absolute: 0 10*3/uL (ref 0.0–0.1)
Basophils Relative: 0 %
Eosinophils Absolute: 0.2 10*3/uL (ref 0.0–0.5)
Eosinophils Relative: 3 %
HCT: 29.4 % — ABNORMAL LOW (ref 36.0–46.0)
Hemoglobin: 9.3 g/dL — ABNORMAL LOW (ref 12.0–15.0)
Immature Granulocytes: 1 %
Lymphocytes Relative: 26 %
Lymphs Abs: 1.5 10*3/uL (ref 0.7–4.0)
MCH: 28.8 pg (ref 26.0–34.0)
MCHC: 31.6 g/dL (ref 30.0–36.0)
MCV: 91 fL (ref 80.0–100.0)
Monocytes Absolute: 0.5 10*3/uL (ref 0.1–1.0)
Monocytes Relative: 9 %
Neutro Abs: 3.3 10*3/uL (ref 1.7–7.7)
Neutrophils Relative %: 61 %
Platelets: 281 10*3/uL (ref 150–400)
RBC: 3.23 MIL/uL — ABNORMAL LOW (ref 3.87–5.11)
RDW: 19.3 % — ABNORMAL HIGH (ref 11.5–15.5)
WBC: 5.5 10*3/uL (ref 4.0–10.5)
nRBC: 0 % (ref 0.0–0.2)

## 2021-10-16 LAB — BASIC METABOLIC PANEL
Anion gap: 14 (ref 5–15)
BUN: 56 mg/dL — ABNORMAL HIGH (ref 8–23)
CO2: 23 mmol/L (ref 22–32)
Calcium: 8.9 mg/dL (ref 8.9–10.3)
Chloride: 103 mmol/L (ref 98–111)
Creatinine, Ser: 6.86 mg/dL — ABNORMAL HIGH (ref 0.44–1.00)
GFR, Estimated: 6 mL/min — ABNORMAL LOW (ref >60)
Glucose, Bld: 106 mg/dL — ABNORMAL HIGH (ref 70–99)
Potassium: 4.6 mmol/L (ref 3.5–5.1)
Sodium: 140 mmol/L (ref 135–145)

## 2021-10-16 MED ORDER — HYDROCODONE-ACETAMINOPHEN 5-325 MG PO TABS
1.0000 | ORAL_TABLET | Freq: Once | ORAL | Status: AC
  Administered 2021-10-16: 1 via ORAL
  Filled 2021-10-16: qty 1

## 2021-10-16 NOTE — ED Provider Notes (Addendum)
[facility] EMERGENCY DEPARTMENT Provider Note   CSN: [id number] Arrival date & time: 10/16/21  1422     History  Chief Complaint  Patient presents with   Knee Pain    Cindy Park is a 66 y.o. female.  66 year old female presents today for evaluation of left knee pain and swelling that has significantly worsened since last night.  Patient was diagnosed with meniscal tear 3 months ago and has been following with her orthopedist and recently had an MRI done on January 9 which confirmed meniscal tear.  Patient is currently undergoing conservative management.  She states suddenly last night the swelling and pain worsened.  She states she has not been able to bear weight and has been using her crutches.  She states she went for her scheduled dialysis but was unable to get out the car for her session it was recommended she come to the emergency room.  She did not have her dialysis session today.  Last session was Thursday.  She denies fever, chills.  Denies extended travel, recent surgery, prior history of DVT.  The history is provided by the patient. No language interpreter was used.  Knee Pain Associated symptoms: no fever       Home Medications Prior to Admission medications   Medication Sig Start Date End Date Taking? Authorizing Provider  acetaminophen (TYLENOL) 500 MG tablet Take 1,000 mg by mouth every 6 (six) hours as needed for moderate pain.     [provider]  albuterol (PROVENTIL) (2.5 MG/3ML) 0.083% nebulizer solution Take 2.5 mg by nebulization 4 (four) times daily.     [provider]  albuterol (VENTOLIN HFA) 108 (90 Base) MCG/ACT inhaler Inhale 2 puffs into the lungs every 6 (six) hours as needed for wheezing or shortness of breath.    [provider]  amLODipine (NORVASC) 5 MG tablet Take 5 mg by mouth 2 (two) times daily.    [provider]  aspirin EC 81 MG tablet Take 81 mg by mouth in the morning.    [provider]  atorvastatin (LIPITOR) 40 MG tablet Take 40 mg by mouth at bedtime.     [provider]  calcitRIOL (ROCALTROL) 0.5 MCG capsule Take 0.5 mcg by mouth in the morning.    [provider]  carvedilol (COREG) 25 MG tablet Take 0.5 tablets (12.5 mg total) by mouth 2 (two) times daily with a meal. 02/10/21   Kathie Dike, MD  Cholecalciferol (VITAMIN D3) 50 MCG (2000 UT) TABS Take 2,000 Units by mouth every evening.    [provider]  clonazePAM (KLONOPIN) 0.5 MG tablet Take 0.5 mg by mouth 2 (two) times daily as needed for anxiety.    [provider]  dicyclomine (BENTYL) 10 MG capsule Take 10 mg by mouth 2 (two) times daily.     [provider]  docusate sodium (COLACE) 100 MG capsule Take 200 mg by mouth daily.    [provider]  epoetin alfa (EPOGEN) 3000 UNIT/ML injection Inject 3,000 Units into the skin every 14 (fourteen) days.     Bhutani, Manpreet S, MD  epoetin alfa-epbx (RETACRIT) 62130 UNIT/ML injection 6,000 Units every 14 (fourteen) days. 11/27/20   Bhutani, Lavina Hamman, MD  ferrous sulfate 325 (65 FE) MG tablet Take 325 mg by mouth 2 (two) times daily with a meal.    [provider]  Fluticasone-Umeclidin-Vilant (TRELEGY ELLIPTA) 100-62.5-25 MCG/INH AEPB Inhale 1 puff into the lungs daily.  [provider]  furosemide (LASIX) 80 MG tablet Take 80 mg by mouth 2 (two) times daily.    [provider]  gabapentin (NEURONTIN) 300 MG capsule Take 300 mg by mouth 2 (two) times daily.    [provider]  gemfibrozil (LOPID) 600 MG tablet Take 600 mg by mouth in the morning and at bedtime.    [provider]  Insulin Lispro Prot & Lispro (HUMALOG MIX 75/25 [place]) Inject 15-60 Units into the skin See admin instructions. Inject 60 units into the skin in the morning and 15 units at lunch and 60 units at supper    [provider]  levothyroxine (SYNTHROID) 50 MCG tablet Take 50 mcg  by mouth daily before breakfast.  03/05/20   [provider]  montelukast (SINGULAIR) 10 MG tablet Take 10 mg by mouth at bedtime.    [provider]  omeprazole (PRILOSEC) 20 MG capsule Take 20 mg by mouth every evening.    [provider]  ondansetron (ZOFRAN) 4 MG tablet Take 4 mg by mouth every 8 (eight) hours as needed for nausea or vomiting. 09/28/20   [provider]  sevelamer carbonate (RENVELA) 800 MG tablet Take 800 mg by mouth 3 (three) times daily with meals. 11/20/20   [provider]  sitaGLIPtin (JANUVIA) 25 MG tablet Take 25 mg by mouth in the morning.    [provider]  sodium bicarbonate 650 MG tablet Take 650 mg by mouth 2 (two) times daily.    [provider]  triamcinolone (KENALOG) 0.1 % Apply 1 application topically daily as needed (skin irritation). To feet 07/07/20   [provider]  Vitamin A 2400 MCG (8000 UT) TABS Take 2,400 mcg by mouth every evening.    [provider]  vitamin B-12 (CYANOCOBALAMIN) 500 MCG tablet Take 500 mcg by mouth in the morning.    [provider]  vitamin E 180 MG (400 UNITS) capsule Take 400 Units by mouth daily.    [provider]      Allergies    Latex, Niacin, Other, Tomato, and Bactrim [sulfamethoxazole-trimethoprim]    Review of Systems   Review of Systems  Constitutional:  Negative for chills and fever.  Respiratory:  Negative for shortness of breath.   Cardiovascular:  Positive for leg swelling. Negative for chest pain.  Musculoskeletal:  Positive for arthralgias, gait problem (Antalgic gait) and joint swelling.  Neurological:  Negative for weakness.  All other systems reviewed and are negative.  Physical Exam Updated Vital Signs BP (!) 144/83 (BP Location: Right Arm)    Pulse 86    Temp 97.8 F (36.6 C) (Oral)    Resp 14    SpO2 99%  Physical Exam Vitals and nursing note reviewed.  Constitutional:      General: She is not in  acute distress.    Appearance: Normal appearance. She is not ill-appearing.  HENT:     Head: Normocephalic and atraumatic.     Nose: Nose normal.  Eyes:     Conjunctiva/sclera: Conjunctivae normal.  Pulmonary:     Effort: Pulmonary effort is normal. No respiratory distress.  Musculoskeletal:        General: No deformity.     Right lower leg: No edema.     Left lower leg: Edema present.     Comments: Left knee joint swelling.  Left lower extremity with nonpitting edema compared to contralateral leg.  2+ DP pulse present.  Strength with knee extension  limited secondary to pain.  Knee flexion of about 30 degrees present and limited beyond that secondary to pain.  Knee joint with tenderness to palpation.  Skin:    Findings: No rash.  Neurological:     Mental Status: She is alert.    ED Results / Procedures / Treatments   Labs (all labs ordered are listed, but only abnormal results are displayed) Labs Reviewed  CBC WITH DIFFERENTIAL/PLATELET  BASIC METABOLIC PANEL    EKG None  Radiology No results found.  Procedures Procedures    Medications Ordered in ED Medications - No data to display  ED Course/ Medical Decision Making/ A&P                           Medical Decision Making Amount and/or Complexity of Data Reviewed Labs: ordered. Radiology: ordered. ECG/medicine tests: ordered.  Risk Prescription drug management.   Medical Decision Making / ED Course   This patient presents to the ED for concern of left knee pain and swelling, this involves an extensive number of treatment options, and is a complaint that carries with it a high risk of complications and morbidity.  The differential diagnosis includes flareup of meniscal tear, septic joint, DVT, cellulitis, gout  MDM: 66 year old female presents today for evaluation of left knee pain and swelling onset last night.  Does endorse pain shooting down her left lower extremity distal to her left knee.  Patient was  diagnosed with meniscal tear in the left knee with MRI that she had performed mid January 2023.  She follows with orthopedist.  Currently pursuing conservative management.  She reports she has had 3 injections in the left knee so far.  Denies trauma prior to worsening of the pain last night.  Denies shortness of breath, chest pain, or palpitations.  Patient has been able to bear weight but because of pain she has been using crutches since last night.  Patient on exam does have swelling distal to the left knee compared to the contralateral leg.  Will obtain CBC, BMP, DVT study, left knee x-ray. Left knee x-ray without acute fracture.  Knee x-ray does demonstrate mild to moderate knee effusion.  DVT study negative.  CBC without leukocytosis.  BMP with stable creatinine, and without electrolyte derangements.  Given patient is able to bear weight and take a step on exam, without significant tenderness to palpation present on exam and without leukocytosis on CBC unlikely that this is septic joint.  No prior history of gout.  Likely this is a flareup of the meniscal tear as well as sprain that was found on recent MRI.  Discussed importance of continuing her pain medicine for severe pain, elevating the left leg to help with the swelling, continue to use crutches that she has at home, and follow-up with her orthopedist.  Patient voices understanding and is in agreement with plan.  Return precautions discussed.  Discussed to reach out to her dialysis center to reschedule her dialysis session.   Additional history obtained: -Additional history obtained from review of care everywhere confirming that she had an MRI January 9th that showed radial meniscal tear in the left knee along with grade 1 ligament sprain. -External records from outside source obtained and reviewed including: Chart review including previous notes, labs, imaging, consultation notes   Lab Tests: -I ordered, reviewed, and interpreted labs.   The  pertinent results include:   Labs Reviewed  CBC WITH DIFFERENTIAL/PLATELET - Abnormal; Notable for  the following components:      Result Value   RBC 3.23 (*)    Hemoglobin 9.3 (*)    HCT 29.4 (*)    RDW 19.3 (*)    All other components within normal limits  BASIC METABOLIC PANEL - Abnormal; Notable for the following components:   Glucose, Bld 106 (*)    BUN 56 (*)    Creatinine, Ser 6.86 (*)    GFR, Estimated 6 (*)    All other components within normal limits      EKG  EKG Interpretation  Date/Time:    Ventricular Rate:    PR Interval:    QRS Duration:   QT Interval:    QTC Calculation:   R Axis:     Text Interpretation:           Imaging Studies ordered: I ordered imaging studies including left knee x-ray, left lower extremity DVT study I independently visualized and interpreted imaging. I agree with the radiologist interpretation   Medicines ordered and prescription drug management: Meds ordered this encounter  Medications   HYDROcodone-acetaminophen (NORCO/VICODIN) 5-325 MG per tablet 1 tablet    -I have reviewed the patients home medicines and have made adjustments as needed  Reevaluation: After the interventions noted above, I reevaluated the patient and found that they have :improved  Co morbidities that complicate the patient evaluation  Past Medical History:  Diagnosis Date   Anemia    Arthritis    Asthma    Cataract    Combined form OU   COPD (chronic obstructive pulmonary disease) ([place])    Diabetes mellitus without complication ([place])    Diabetic retinopathy ([place])    BDR OU   Family history of adverse reaction to anesthesia    sister- difficult to wake up from anesthesia   Fluid excess    GERD (gastroesophageal reflux disease)    Goiter    Hypertension    Hypertensive retinopathy    OU   Renal failure       Dispostion: Patient is appropriate for discharge.  Discharged in stable condition.  Return precautions discussed.  Discussed  importance of follow-up with her orthopedist.   Final Clinical Impression(s) / ED Diagnoses Final diagnoses:  Acute pain of left knee    Rx / DC Orders ED Discharge Orders     None         Evlyn Courier, PA-C 10/16/21 1828    Evlyn Courier, PA-C 10/16/21 1837    Lucrezia Starch, MD 10/17/21 1620

## 2021-10-16 NOTE — Progress Notes (Signed)
VASCULAR LAB    Left lower extremity venous duplex has been performed.  See CV proc for preliminary results.  Messaged results to Evlyn Courier, PA-C via secure chat  Sharion Dove, RVT 10/16/2021, 5:24 PM

## 2021-10-16 NOTE — Discharge Instructions (Signed)
Your x-ray today did not show any concern for fracture.  It showed small amount of joint effusion.  He also had a ultrasound done of your left leg which did not show any evidence of clots.  Your blood work today did not show any signs of infection.  Your kidney function and electrolytes were stable.  Follow-up with your dialysis center for dialysis.  Follow-up with your orthopedist for further work-up and management of your left knee pain.  Continue taking your pain medication as you need to.  Elevate the leg is much as possible to help with the swelling.  You can also use ice over the knee every 3-4 hours for about 15 to 20 minutes.

## 2021-10-16 NOTE — ED Triage Notes (Signed)
Pt reports L knee pain that she has been seeing an orthopedist for.  Last visit 8 days ago.  Reports pain worse since yesterday.

## 2021-10-16 NOTE — ED Notes (Signed)
Patient transported to X-ray 

## 2021-10-18 DIAGNOSIS — M170 Bilateral primary osteoarthritis of knee: Secondary | ICD-10-CM | POA: Diagnosis not present

## 2021-10-18 DIAGNOSIS — E119 Type 2 diabetes mellitus without complications: Secondary | ICD-10-CM | POA: Diagnosis not present

## 2021-10-19 DIAGNOSIS — Z992 Dependence on renal dialysis: Secondary | ICD-10-CM | POA: Diagnosis not present

## 2021-10-19 DIAGNOSIS — N2581 Secondary hyperparathyroidism of renal origin: Secondary | ICD-10-CM | POA: Diagnosis not present

## 2021-10-19 DIAGNOSIS — I10 Essential (primary) hypertension: Secondary | ICD-10-CM | POA: Diagnosis not present

## 2021-10-19 DIAGNOSIS — N186 End stage renal disease: Secondary | ICD-10-CM | POA: Diagnosis not present

## 2021-10-19 DIAGNOSIS — E7800 Pure hypercholesterolemia, unspecified: Secondary | ICD-10-CM | POA: Diagnosis not present

## 2021-10-21 DIAGNOSIS — Z992 Dependence on renal dialysis: Secondary | ICD-10-CM | POA: Diagnosis not present

## 2021-10-21 DIAGNOSIS — N186 End stage renal disease: Secondary | ICD-10-CM | POA: Diagnosis not present

## 2021-10-23 DIAGNOSIS — Z992 Dependence on renal dialysis: Secondary | ICD-10-CM | POA: Diagnosis not present

## 2021-10-23 DIAGNOSIS — N186 End stage renal disease: Secondary | ICD-10-CM | POA: Diagnosis not present

## 2021-10-26 DIAGNOSIS — Z992 Dependence on renal dialysis: Secondary | ICD-10-CM | POA: Diagnosis not present

## 2021-10-26 DIAGNOSIS — N186 End stage renal disease: Secondary | ICD-10-CM | POA: Diagnosis not present

## 2021-10-28 DIAGNOSIS — N186 End stage renal disease: Secondary | ICD-10-CM | POA: Diagnosis not present

## 2021-10-28 DIAGNOSIS — Z992 Dependence on renal dialysis: Secondary | ICD-10-CM | POA: Diagnosis not present

## 2021-10-30 DIAGNOSIS — Z992 Dependence on renal dialysis: Secondary | ICD-10-CM | POA: Diagnosis not present

## 2021-10-30 DIAGNOSIS — N186 End stage renal disease: Secondary | ICD-10-CM | POA: Diagnosis not present

## 2021-11-01 DIAGNOSIS — Z87891 Personal history of nicotine dependence: Secondary | ICD-10-CM | POA: Diagnosis not present

## 2021-11-01 DIAGNOSIS — Z992 Dependence on renal dialysis: Secondary | ICD-10-CM | POA: Diagnosis not present

## 2021-11-01 DIAGNOSIS — M1710 Unilateral primary osteoarthritis, unspecified knee: Secondary | ICD-10-CM | POA: Diagnosis not present

## 2021-11-01 DIAGNOSIS — I5032 Chronic diastolic (congestive) heart failure: Secondary | ICD-10-CM | POA: Diagnosis not present

## 2021-11-01 DIAGNOSIS — I10 Essential (primary) hypertension: Secondary | ICD-10-CM | POA: Diagnosis not present

## 2021-11-01 DIAGNOSIS — Z299 Encounter for prophylactic measures, unspecified: Secondary | ICD-10-CM | POA: Diagnosis not present

## 2021-11-02 DIAGNOSIS — Z992 Dependence on renal dialysis: Secondary | ICD-10-CM | POA: Diagnosis not present

## 2021-11-02 DIAGNOSIS — N186 End stage renal disease: Secondary | ICD-10-CM | POA: Diagnosis not present

## 2021-11-04 DIAGNOSIS — Z992 Dependence on renal dialysis: Secondary | ICD-10-CM | POA: Diagnosis not present

## 2021-11-04 DIAGNOSIS — N186 End stage renal disease: Secondary | ICD-10-CM | POA: Diagnosis not present

## 2021-11-06 DIAGNOSIS — Z992 Dependence on renal dialysis: Secondary | ICD-10-CM | POA: Diagnosis not present

## 2021-11-06 DIAGNOSIS — N186 End stage renal disease: Secondary | ICD-10-CM | POA: Diagnosis not present

## 2021-11-09 DIAGNOSIS — Z992 Dependence on renal dialysis: Secondary | ICD-10-CM | POA: Diagnosis not present

## 2021-11-09 DIAGNOSIS — N186 End stage renal disease: Secondary | ICD-10-CM | POA: Diagnosis not present

## 2021-11-11 DIAGNOSIS — N186 End stage renal disease: Secondary | ICD-10-CM | POA: Diagnosis not present

## 2021-11-11 DIAGNOSIS — Z992 Dependence on renal dialysis: Secondary | ICD-10-CM | POA: Diagnosis not present

## 2021-11-13 DIAGNOSIS — N186 End stage renal disease: Secondary | ICD-10-CM | POA: Diagnosis not present

## 2021-11-13 DIAGNOSIS — Z992 Dependence on renal dialysis: Secondary | ICD-10-CM | POA: Diagnosis not present

## 2021-11-15 DIAGNOSIS — Z992 Dependence on renal dialysis: Secondary | ICD-10-CM | POA: Diagnosis not present

## 2021-11-15 DIAGNOSIS — N186 End stage renal disease: Secondary | ICD-10-CM | POA: Diagnosis not present

## 2021-11-15 DIAGNOSIS — I871 Compression of vein: Secondary | ICD-10-CM | POA: Diagnosis not present

## 2021-11-16 DIAGNOSIS — Z794 Long term (current) use of insulin: Secondary | ICD-10-CM | POA: Diagnosis not present

## 2021-11-16 DIAGNOSIS — Z992 Dependence on renal dialysis: Secondary | ICD-10-CM | POA: Diagnosis not present

## 2021-11-16 DIAGNOSIS — N186 End stage renal disease: Secondary | ICD-10-CM | POA: Diagnosis not present

## 2021-11-16 DIAGNOSIS — E119 Type 2 diabetes mellitus without complications: Secondary | ICD-10-CM | POA: Diagnosis not present

## 2021-11-18 DIAGNOSIS — I10 Essential (primary) hypertension: Secondary | ICD-10-CM | POA: Diagnosis not present

## 2021-11-18 DIAGNOSIS — Z992 Dependence on renal dialysis: Secondary | ICD-10-CM | POA: Diagnosis not present

## 2021-11-18 DIAGNOSIS — N186 End stage renal disease: Secondary | ICD-10-CM | POA: Diagnosis not present

## 2021-11-19 DIAGNOSIS — Z992 Dependence on renal dialysis: Secondary | ICD-10-CM | POA: Diagnosis not present

## 2021-11-19 DIAGNOSIS — N186 End stage renal disease: Secondary | ICD-10-CM | POA: Diagnosis not present

## 2021-11-20 DIAGNOSIS — E559 Vitamin D deficiency, unspecified: Secondary | ICD-10-CM | POA: Diagnosis not present

## 2021-11-20 DIAGNOSIS — Z992 Dependence on renal dialysis: Secondary | ICD-10-CM | POA: Diagnosis not present

## 2021-11-20 DIAGNOSIS — N250 Renal osteodystrophy: Secondary | ICD-10-CM | POA: Diagnosis not present

## 2021-11-20 DIAGNOSIS — N2581 Secondary hyperparathyroidism of renal origin: Secondary | ICD-10-CM | POA: Diagnosis not present

## 2021-11-20 DIAGNOSIS — N186 End stage renal disease: Secondary | ICD-10-CM | POA: Diagnosis not present

## 2021-11-23 DIAGNOSIS — N250 Renal osteodystrophy: Secondary | ICD-10-CM | POA: Diagnosis not present

## 2021-11-23 DIAGNOSIS — N2581 Secondary hyperparathyroidism of renal origin: Secondary | ICD-10-CM | POA: Diagnosis not present

## 2021-11-23 DIAGNOSIS — N186 End stage renal disease: Secondary | ICD-10-CM | POA: Diagnosis not present

## 2021-11-23 DIAGNOSIS — Z992 Dependence on renal dialysis: Secondary | ICD-10-CM | POA: Diagnosis not present

## 2021-11-23 DIAGNOSIS — I5032 Chronic diastolic (congestive) heart failure: Secondary | ICD-10-CM | POA: Diagnosis not present

## 2021-11-23 DIAGNOSIS — E559 Vitamin D deficiency, unspecified: Secondary | ICD-10-CM | POA: Diagnosis not present

## 2021-11-23 DIAGNOSIS — I10 Essential (primary) hypertension: Secondary | ICD-10-CM | POA: Diagnosis not present

## 2021-11-25 DIAGNOSIS — E559 Vitamin D deficiency, unspecified: Secondary | ICD-10-CM | POA: Diagnosis not present

## 2021-11-25 DIAGNOSIS — N186 End stage renal disease: Secondary | ICD-10-CM | POA: Diagnosis not present

## 2021-11-25 DIAGNOSIS — Z992 Dependence on renal dialysis: Secondary | ICD-10-CM | POA: Diagnosis not present

## 2021-11-25 DIAGNOSIS — N250 Renal osteodystrophy: Secondary | ICD-10-CM | POA: Diagnosis not present

## 2021-11-25 DIAGNOSIS — N2581 Secondary hyperparathyroidism of renal origin: Secondary | ICD-10-CM | POA: Diagnosis not present

## 2021-11-27 DIAGNOSIS — N250 Renal osteodystrophy: Secondary | ICD-10-CM | POA: Diagnosis not present

## 2021-11-27 DIAGNOSIS — E559 Vitamin D deficiency, unspecified: Secondary | ICD-10-CM | POA: Diagnosis not present

## 2021-11-27 DIAGNOSIS — N2581 Secondary hyperparathyroidism of renal origin: Secondary | ICD-10-CM | POA: Diagnosis not present

## 2021-11-27 DIAGNOSIS — Z992 Dependence on renal dialysis: Secondary | ICD-10-CM | POA: Diagnosis not present

## 2021-11-27 DIAGNOSIS — N186 End stage renal disease: Secondary | ICD-10-CM | POA: Diagnosis not present

## 2021-11-30 DIAGNOSIS — N2581 Secondary hyperparathyroidism of renal origin: Secondary | ICD-10-CM | POA: Diagnosis not present

## 2021-11-30 DIAGNOSIS — N186 End stage renal disease: Secondary | ICD-10-CM | POA: Diagnosis not present

## 2021-11-30 DIAGNOSIS — Z992 Dependence on renal dialysis: Secondary | ICD-10-CM | POA: Diagnosis not present

## 2021-11-30 DIAGNOSIS — N250 Renal osteodystrophy: Secondary | ICD-10-CM | POA: Diagnosis not present

## 2021-11-30 DIAGNOSIS — E559 Vitamin D deficiency, unspecified: Secondary | ICD-10-CM | POA: Diagnosis not present

## 2021-12-02 DIAGNOSIS — E559 Vitamin D deficiency, unspecified: Secondary | ICD-10-CM | POA: Diagnosis not present

## 2021-12-02 DIAGNOSIS — N250 Renal osteodystrophy: Secondary | ICD-10-CM | POA: Diagnosis not present

## 2021-12-02 DIAGNOSIS — N2581 Secondary hyperparathyroidism of renal origin: Secondary | ICD-10-CM | POA: Diagnosis not present

## 2021-12-02 DIAGNOSIS — Z992 Dependence on renal dialysis: Secondary | ICD-10-CM | POA: Diagnosis not present

## 2021-12-02 DIAGNOSIS — N186 End stage renal disease: Secondary | ICD-10-CM | POA: Diagnosis not present

## 2021-12-04 DIAGNOSIS — Z992 Dependence on renal dialysis: Secondary | ICD-10-CM | POA: Diagnosis not present

## 2021-12-04 DIAGNOSIS — E559 Vitamin D deficiency, unspecified: Secondary | ICD-10-CM | POA: Diagnosis not present

## 2021-12-04 DIAGNOSIS — N186 End stage renal disease: Secondary | ICD-10-CM | POA: Diagnosis not present

## 2021-12-04 DIAGNOSIS — N250 Renal osteodystrophy: Secondary | ICD-10-CM | POA: Diagnosis not present

## 2021-12-04 DIAGNOSIS — N2581 Secondary hyperparathyroidism of renal origin: Secondary | ICD-10-CM | POA: Diagnosis not present

## 2021-12-06 ENCOUNTER — Ambulatory Visit: Payer: Medicare Other | Admitting: Gastroenterology

## 2021-12-07 DIAGNOSIS — N186 End stage renal disease: Secondary | ICD-10-CM | POA: Diagnosis not present

## 2021-12-07 DIAGNOSIS — N250 Renal osteodystrophy: Secondary | ICD-10-CM | POA: Diagnosis not present

## 2021-12-07 DIAGNOSIS — N2581 Secondary hyperparathyroidism of renal origin: Secondary | ICD-10-CM | POA: Diagnosis not present

## 2021-12-07 DIAGNOSIS — Z992 Dependence on renal dialysis: Secondary | ICD-10-CM | POA: Diagnosis not present

## 2021-12-07 DIAGNOSIS — E559 Vitamin D deficiency, unspecified: Secondary | ICD-10-CM | POA: Diagnosis not present

## 2021-12-08 DIAGNOSIS — Z992 Dependence on renal dialysis: Secondary | ICD-10-CM | POA: Diagnosis not present

## 2021-12-08 DIAGNOSIS — N186 End stage renal disease: Secondary | ICD-10-CM | POA: Diagnosis not present

## 2021-12-09 DIAGNOSIS — N250 Renal osteodystrophy: Secondary | ICD-10-CM | POA: Diagnosis not present

## 2021-12-09 DIAGNOSIS — Z992 Dependence on renal dialysis: Secondary | ICD-10-CM | POA: Diagnosis not present

## 2021-12-09 DIAGNOSIS — N2581 Secondary hyperparathyroidism of renal origin: Secondary | ICD-10-CM | POA: Diagnosis not present

## 2021-12-09 DIAGNOSIS — N186 End stage renal disease: Secondary | ICD-10-CM | POA: Diagnosis not present

## 2021-12-09 DIAGNOSIS — E559 Vitamin D deficiency, unspecified: Secondary | ICD-10-CM | POA: Diagnosis not present

## 2021-12-11 DIAGNOSIS — E559 Vitamin D deficiency, unspecified: Secondary | ICD-10-CM | POA: Diagnosis not present

## 2021-12-11 DIAGNOSIS — N2581 Secondary hyperparathyroidism of renal origin: Secondary | ICD-10-CM | POA: Diagnosis not present

## 2021-12-11 DIAGNOSIS — N250 Renal osteodystrophy: Secondary | ICD-10-CM | POA: Diagnosis not present

## 2021-12-11 DIAGNOSIS — N186 End stage renal disease: Secondary | ICD-10-CM | POA: Diagnosis not present

## 2021-12-11 DIAGNOSIS — Z992 Dependence on renal dialysis: Secondary | ICD-10-CM | POA: Diagnosis not present

## 2021-12-14 DIAGNOSIS — N2581 Secondary hyperparathyroidism of renal origin: Secondary | ICD-10-CM | POA: Diagnosis not present

## 2021-12-14 DIAGNOSIS — N186 End stage renal disease: Secondary | ICD-10-CM | POA: Diagnosis not present

## 2021-12-14 DIAGNOSIS — N250 Renal osteodystrophy: Secondary | ICD-10-CM | POA: Diagnosis not present

## 2021-12-14 DIAGNOSIS — E559 Vitamin D deficiency, unspecified: Secondary | ICD-10-CM | POA: Diagnosis not present

## 2021-12-14 DIAGNOSIS — Z992 Dependence on renal dialysis: Secondary | ICD-10-CM | POA: Diagnosis not present

## 2021-12-15 ENCOUNTER — Ambulatory Visit: Payer: Medicare Other | Admitting: Gastroenterology

## 2021-12-16 DIAGNOSIS — N250 Renal osteodystrophy: Secondary | ICD-10-CM | POA: Diagnosis not present

## 2021-12-16 DIAGNOSIS — N2581 Secondary hyperparathyroidism of renal origin: Secondary | ICD-10-CM | POA: Diagnosis not present

## 2021-12-16 DIAGNOSIS — E559 Vitamin D deficiency, unspecified: Secondary | ICD-10-CM | POA: Diagnosis not present

## 2021-12-16 DIAGNOSIS — Z992 Dependence on renal dialysis: Secondary | ICD-10-CM | POA: Diagnosis not present

## 2021-12-16 DIAGNOSIS — N186 End stage renal disease: Secondary | ICD-10-CM | POA: Diagnosis not present

## 2021-12-18 DIAGNOSIS — Z992 Dependence on renal dialysis: Secondary | ICD-10-CM | POA: Diagnosis not present

## 2021-12-18 DIAGNOSIS — N2581 Secondary hyperparathyroidism of renal origin: Secondary | ICD-10-CM | POA: Diagnosis not present

## 2021-12-18 DIAGNOSIS — E559 Vitamin D deficiency, unspecified: Secondary | ICD-10-CM | POA: Diagnosis not present

## 2021-12-18 DIAGNOSIS — N250 Renal osteodystrophy: Secondary | ICD-10-CM | POA: Diagnosis not present

## 2021-12-18 DIAGNOSIS — N186 End stage renal disease: Secondary | ICD-10-CM | POA: Diagnosis not present

## 2021-12-19 DIAGNOSIS — Z992 Dependence on renal dialysis: Secondary | ICD-10-CM | POA: Diagnosis not present

## 2021-12-19 DIAGNOSIS — N186 End stage renal disease: Secondary | ICD-10-CM | POA: Diagnosis not present

## 2021-12-19 DIAGNOSIS — I10 Essential (primary) hypertension: Secondary | ICD-10-CM | POA: Diagnosis not present

## 2021-12-21 DIAGNOSIS — Z992 Dependence on renal dialysis: Secondary | ICD-10-CM | POA: Diagnosis not present

## 2021-12-21 DIAGNOSIS — N250 Renal osteodystrophy: Secondary | ICD-10-CM | POA: Diagnosis not present

## 2021-12-21 DIAGNOSIS — E559 Vitamin D deficiency, unspecified: Secondary | ICD-10-CM | POA: Diagnosis not present

## 2021-12-21 DIAGNOSIS — N2581 Secondary hyperparathyroidism of renal origin: Secondary | ICD-10-CM | POA: Diagnosis not present

## 2021-12-21 DIAGNOSIS — N186 End stage renal disease: Secondary | ICD-10-CM | POA: Diagnosis not present

## 2021-12-23 DIAGNOSIS — N2581 Secondary hyperparathyroidism of renal origin: Secondary | ICD-10-CM | POA: Diagnosis not present

## 2021-12-23 DIAGNOSIS — Z992 Dependence on renal dialysis: Secondary | ICD-10-CM | POA: Diagnosis not present

## 2021-12-23 DIAGNOSIS — N250 Renal osteodystrophy: Secondary | ICD-10-CM | POA: Diagnosis not present

## 2021-12-23 DIAGNOSIS — N186 End stage renal disease: Secondary | ICD-10-CM | POA: Diagnosis not present

## 2021-12-23 DIAGNOSIS — E559 Vitamin D deficiency, unspecified: Secondary | ICD-10-CM | POA: Diagnosis not present

## 2021-12-24 IMAGING — US US THYROID
1 series · 13 of 25 positions shown · non-contrast
Comparison: 12/27/2019

CLINICAL DATA: Thyroid nodules

EXAM:
THYROID ULTRASOUND
TECHNIQUE: Ultrasound examination of the thyroid gland and adjacent soft
tissues was performed.

[Series 1: us thyroid · 13 of 74 slices shown]
[im 1/74]
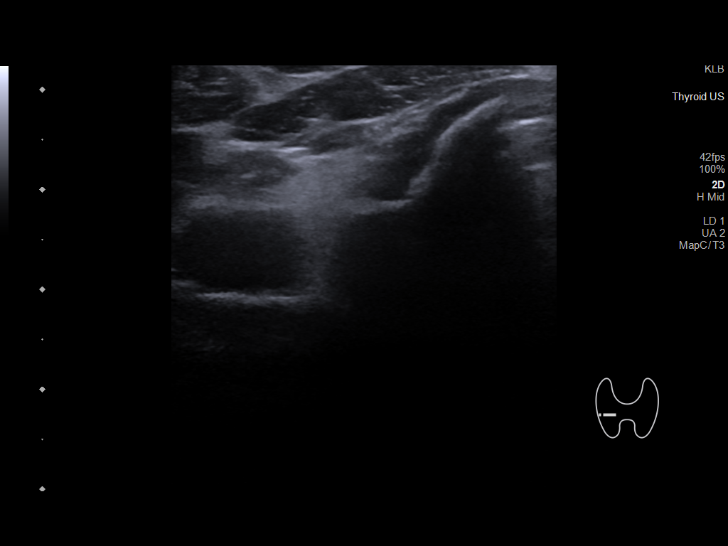
[im 7/74]
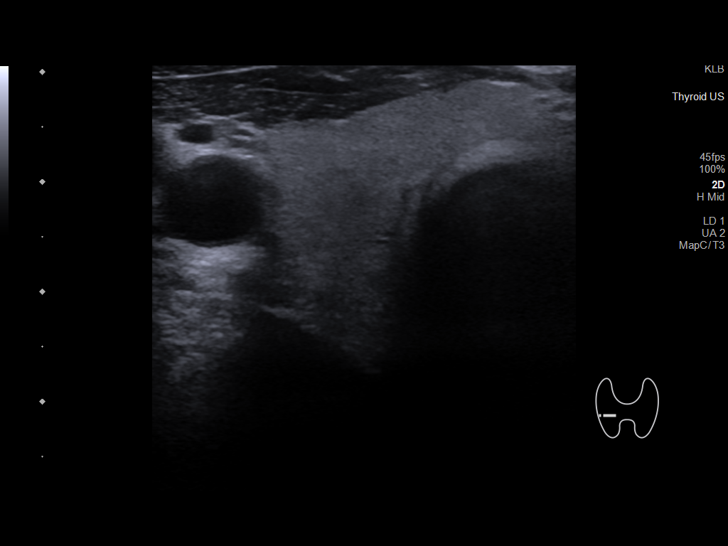
[im 13/74]
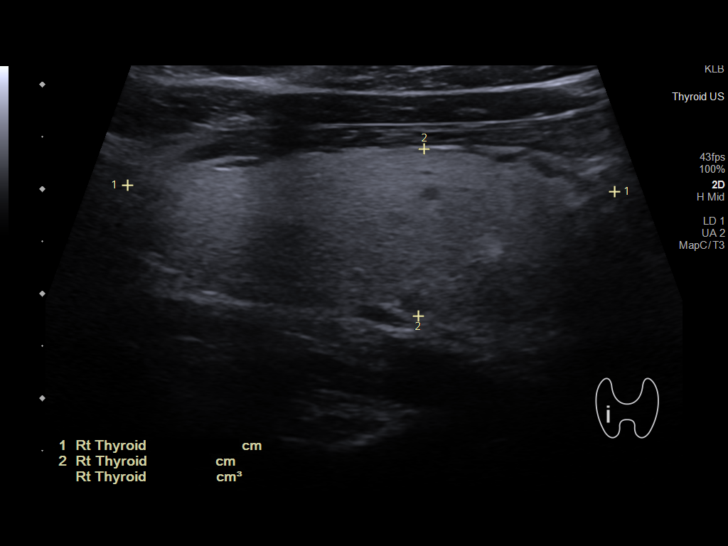
[im 19/74]
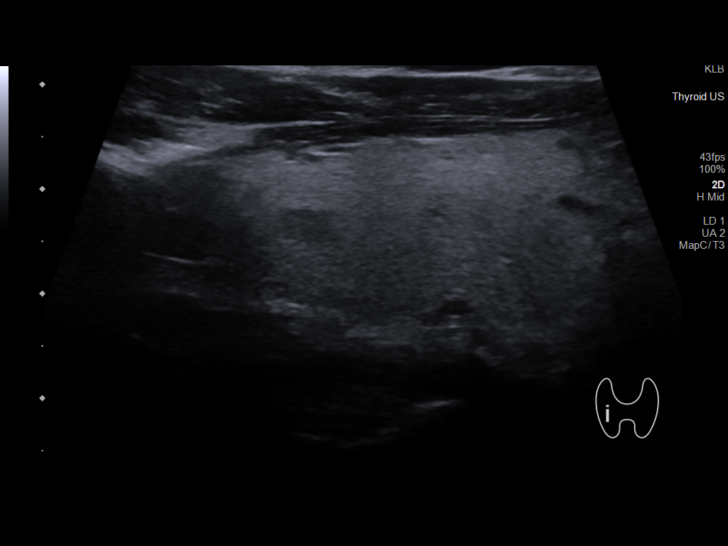
[im 25/74]
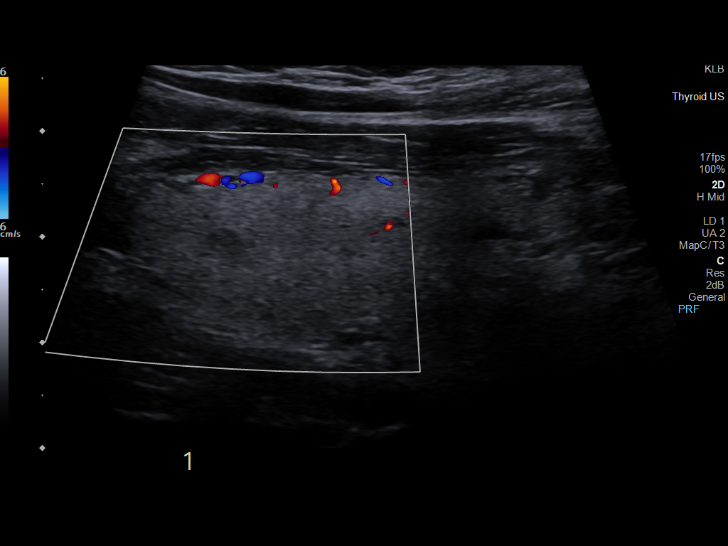
[im 31/74]
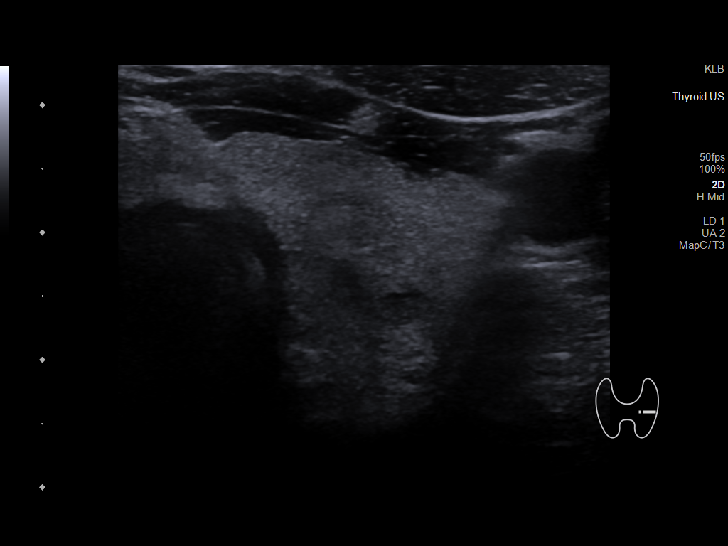
[im 37/74]
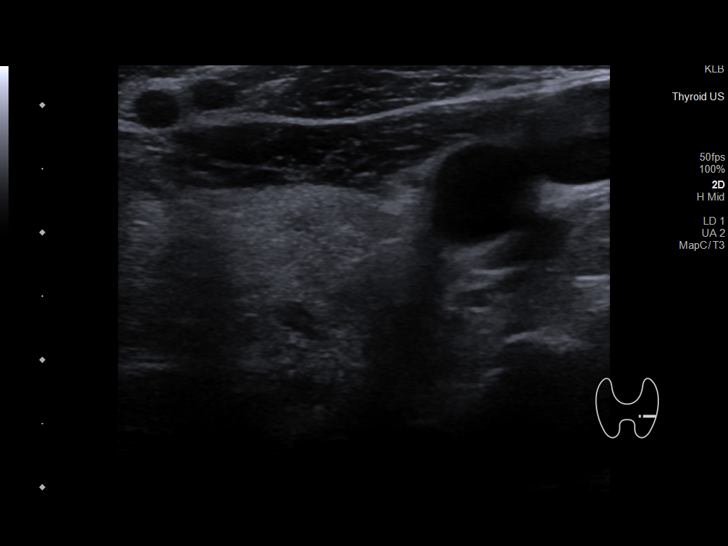
[im 43/74]
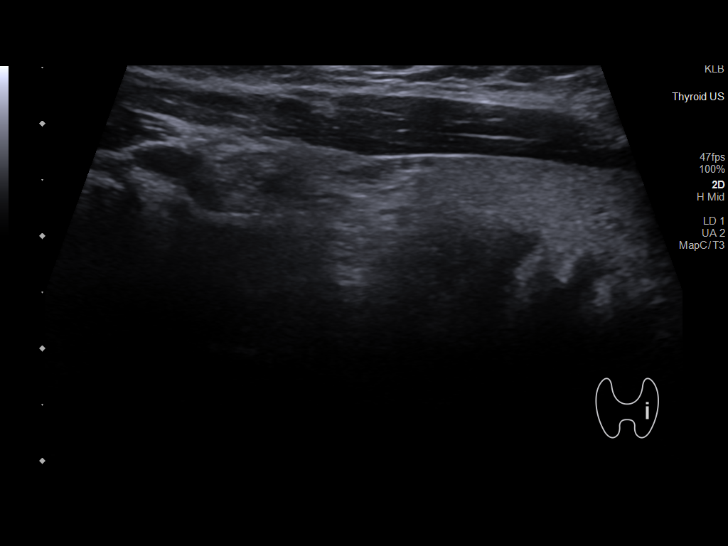
[im 49/74]
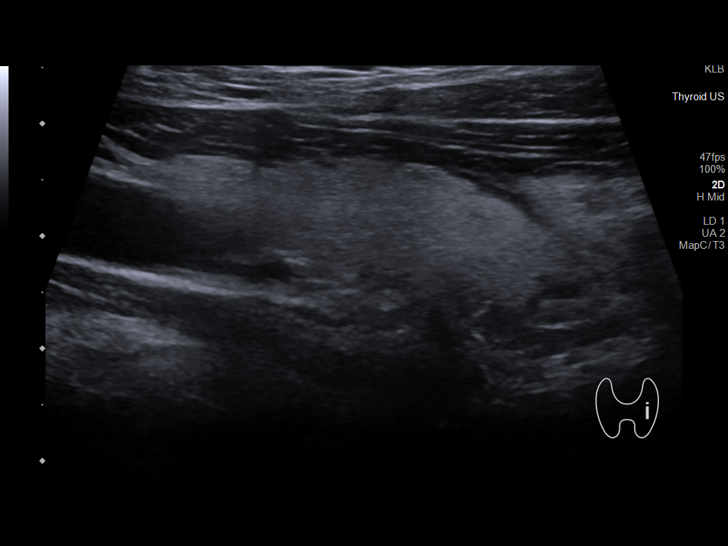
[im 55/74]
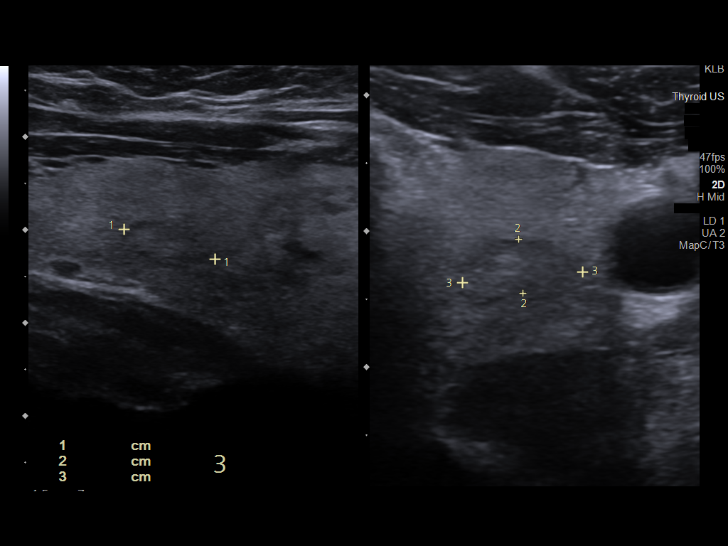
[im 61/74]
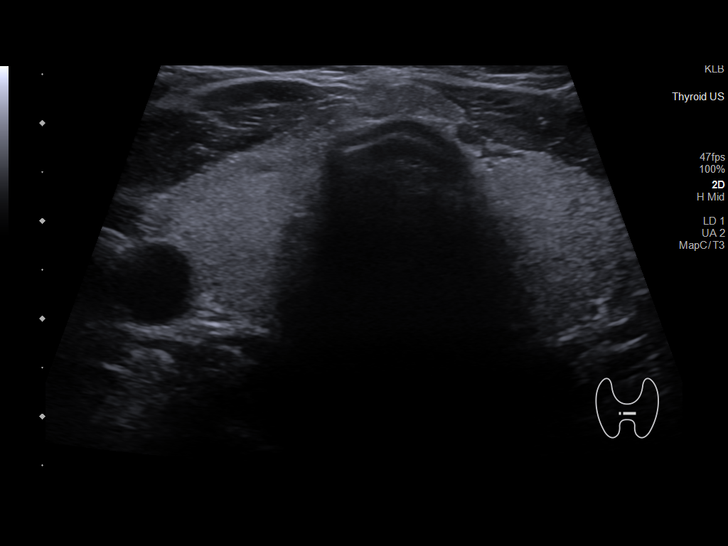
[im 67/74]
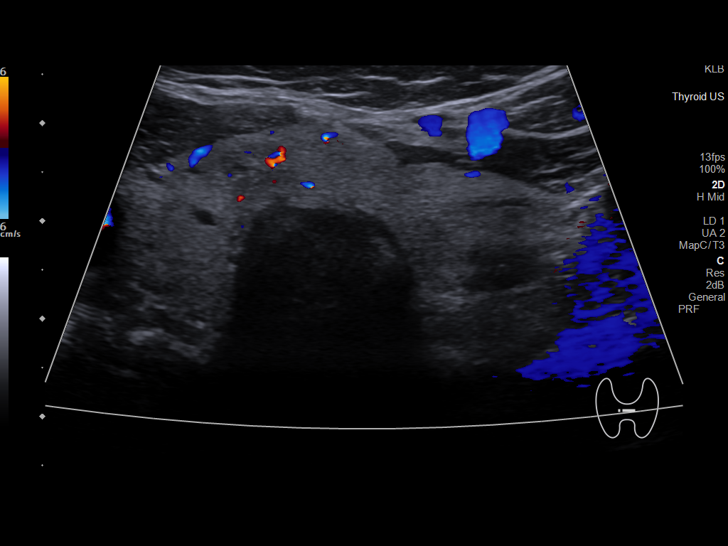
[im 74/74]
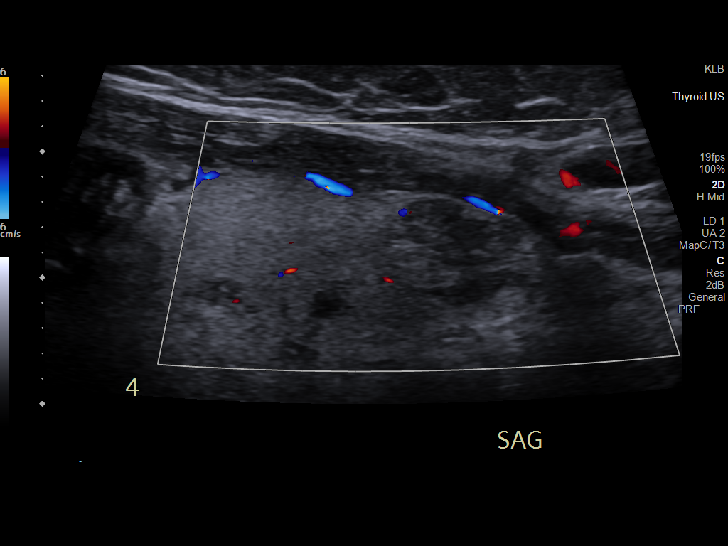

[13 of 25 positions shown; findings below may reference images not displayed]

FINDINGS: Parenchymal Echotexture: Mildly heterogenous

Isthmus: 9 mm

Right lobe: 4.7 x 1.6 x 1.6 cm

Left lobe: 5.3 x 2.0 x 1.7 cm

_________________________________________________________

Estimated total number of nodules >/= 1 cm: 3

Number of spongiform nodules >/=  2 cm not described below (TR1): 0

Number of mixed cystic and solid nodules >/= 1.5 cm not described
below (TR2): 0

_________________________________________________________

Nodule # 2:

Location: Left; Inferior

Maximum size: 1.8, previously 1.5 cm; Other 2 dimensions: 1.0 x
cm

Composition: solid/almost completely solid (2)

Echogenicity: isoechoic (1)

Shape: not taller-than-wide (0)

Margins: ill-defined (0)

Echogenic foci: none (0)

ACR TI-RADS total points: 3.

ACR TI-RADS risk category: TR3 (3 points).

ACR TI-RADS recommendations:

*Given size (>/= 1.5 - 2.4 cm) and appearance, a follow-up
ultrasound in 1 year should be considered based on TI-RADS criteria.

_________________________________________________________

Nodule # 3:

Location: Left; Mid

Maximum size: 1.0 cm; Other 2 dimensions: 0.4 x 0.9 cm

Composition: solid/almost completely solid (2)

Echogenicity: isoechoic (1)

Shape: not taller-than-wide (0)

Margins: ill-defined (0)

Echogenic foci: none (0)

ACR TI-RADS total points: 3.

ACR TI-RADS risk category: TR3 (3 points).

ACR TI-RADS recommendations:

Given size (<1.4 cm) and appearance, this nodule does NOT meet
TI-RADS criteria for biopsy or dedicated follow-up.

_________________________________________________________

Nodule # 4:

Location: Isthmus; right

Maximum size: 1.5 cm; Other 2 dimensions: 0.9 x 1.4 cm

Composition: solid/almost completely solid (2)

Echogenicity: isoechoic (1)

Shape: not taller-than-wide (0)

Margins: ill-defined (0)

Echogenic foci: none (0)

ACR TI-RADS total points: 3.

ACR TI-RADS risk category: TR3 (3 points).

ACR TI-RADS recommendations:

*Given size (>/= 1.5 - 2.4 cm) and appearance, a follow-up
ultrasound in 1 year should be considered based on TI-RADS criteria.

_________________________________________________________

No hypervascularity or regional adenopathy.
IMPRESSION: 1.8 cm left inferior TR 3 nodule and 1.5 cm right isthmus TR 3
nodule. Both meet criteria for follow-up in 1 year as above.

The above is in keeping with the ACR TI-RADS recommendations - [HOSPITAL] 5357;[DATE].

## 2021-12-25 DIAGNOSIS — Z992 Dependence on renal dialysis: Secondary | ICD-10-CM | POA: Diagnosis not present

## 2021-12-25 DIAGNOSIS — N186 End stage renal disease: Secondary | ICD-10-CM | POA: Diagnosis not present

## 2021-12-25 DIAGNOSIS — E559 Vitamin D deficiency, unspecified: Secondary | ICD-10-CM | POA: Diagnosis not present

## 2021-12-25 DIAGNOSIS — N2581 Secondary hyperparathyroidism of renal origin: Secondary | ICD-10-CM | POA: Diagnosis not present

## 2021-12-25 DIAGNOSIS — N250 Renal osteodystrophy: Secondary | ICD-10-CM | POA: Diagnosis not present

## 2021-12-28 DIAGNOSIS — Z992 Dependence on renal dialysis: Secondary | ICD-10-CM | POA: Diagnosis not present

## 2021-12-28 DIAGNOSIS — E559 Vitamin D deficiency, unspecified: Secondary | ICD-10-CM | POA: Diagnosis not present

## 2021-12-28 DIAGNOSIS — N186 End stage renal disease: Secondary | ICD-10-CM | POA: Diagnosis not present

## 2021-12-28 DIAGNOSIS — N250 Renal osteodystrophy: Secondary | ICD-10-CM | POA: Diagnosis not present

## 2021-12-28 DIAGNOSIS — N2581 Secondary hyperparathyroidism of renal origin: Secondary | ICD-10-CM | POA: Diagnosis not present

## 2021-12-30 DIAGNOSIS — E559 Vitamin D deficiency, unspecified: Secondary | ICD-10-CM | POA: Diagnosis not present

## 2021-12-30 DIAGNOSIS — N2581 Secondary hyperparathyroidism of renal origin: Secondary | ICD-10-CM | POA: Diagnosis not present

## 2021-12-30 DIAGNOSIS — N250 Renal osteodystrophy: Secondary | ICD-10-CM | POA: Diagnosis not present

## 2021-12-30 DIAGNOSIS — N186 End stage renal disease: Secondary | ICD-10-CM | POA: Diagnosis not present

## 2021-12-30 DIAGNOSIS — Z992 Dependence on renal dialysis: Secondary | ICD-10-CM | POA: Diagnosis not present

## 2022-01-01 DIAGNOSIS — N250 Renal osteodystrophy: Secondary | ICD-10-CM | POA: Diagnosis not present

## 2022-01-01 DIAGNOSIS — N2581 Secondary hyperparathyroidism of renal origin: Secondary | ICD-10-CM | POA: Diagnosis not present

## 2022-01-01 DIAGNOSIS — N186 End stage renal disease: Secondary | ICD-10-CM | POA: Diagnosis not present

## 2022-01-01 DIAGNOSIS — Z992 Dependence on renal dialysis: Secondary | ICD-10-CM | POA: Diagnosis not present

## 2022-01-01 DIAGNOSIS — E559 Vitamin D deficiency, unspecified: Secondary | ICD-10-CM | POA: Diagnosis not present

## 2022-01-03 DIAGNOSIS — I5032 Chronic diastolic (congestive) heart failure: Secondary | ICD-10-CM | POA: Diagnosis not present

## 2022-01-03 DIAGNOSIS — Z0000 Encounter for general adult medical examination without abnormal findings: Secondary | ICD-10-CM | POA: Diagnosis not present

## 2022-01-03 DIAGNOSIS — Z299 Encounter for prophylactic measures, unspecified: Secondary | ICD-10-CM | POA: Diagnosis not present

## 2022-01-03 DIAGNOSIS — I10 Essential (primary) hypertension: Secondary | ICD-10-CM | POA: Diagnosis not present

## 2022-01-03 DIAGNOSIS — J449 Chronic obstructive pulmonary disease, unspecified: Secondary | ICD-10-CM | POA: Diagnosis not present

## 2022-01-04 ENCOUNTER — Ambulatory Visit: Payer: Medicare Other | Admitting: Gastroenterology

## 2022-01-04 DIAGNOSIS — Z992 Dependence on renal dialysis: Secondary | ICD-10-CM | POA: Diagnosis not present

## 2022-01-04 DIAGNOSIS — N250 Renal osteodystrophy: Secondary | ICD-10-CM | POA: Diagnosis not present

## 2022-01-04 DIAGNOSIS — E559 Vitamin D deficiency, unspecified: Secondary | ICD-10-CM | POA: Diagnosis not present

## 2022-01-04 DIAGNOSIS — N2581 Secondary hyperparathyroidism of renal origin: Secondary | ICD-10-CM | POA: Diagnosis not present

## 2022-01-04 DIAGNOSIS — N186 End stage renal disease: Secondary | ICD-10-CM | POA: Diagnosis not present

## 2022-01-05 ENCOUNTER — Ambulatory Visit: Payer: Medicare Other | Admitting: Gastroenterology

## 2022-01-06 DIAGNOSIS — N2581 Secondary hyperparathyroidism of renal origin: Secondary | ICD-10-CM | POA: Diagnosis not present

## 2022-01-06 DIAGNOSIS — N186 End stage renal disease: Secondary | ICD-10-CM | POA: Diagnosis not present

## 2022-01-06 DIAGNOSIS — E559 Vitamin D deficiency, unspecified: Secondary | ICD-10-CM | POA: Diagnosis not present

## 2022-01-06 DIAGNOSIS — Z992 Dependence on renal dialysis: Secondary | ICD-10-CM | POA: Diagnosis not present

## 2022-01-06 DIAGNOSIS — N250 Renal osteodystrophy: Secondary | ICD-10-CM | POA: Diagnosis not present

## 2022-01-08 DIAGNOSIS — N250 Renal osteodystrophy: Secondary | ICD-10-CM | POA: Diagnosis not present

## 2022-01-08 DIAGNOSIS — Z992 Dependence on renal dialysis: Secondary | ICD-10-CM | POA: Diagnosis not present

## 2022-01-08 DIAGNOSIS — N2581 Secondary hyperparathyroidism of renal origin: Secondary | ICD-10-CM | POA: Diagnosis not present

## 2022-01-08 DIAGNOSIS — N186 End stage renal disease: Secondary | ICD-10-CM | POA: Diagnosis not present

## 2022-01-08 DIAGNOSIS — E559 Vitamin D deficiency, unspecified: Secondary | ICD-10-CM | POA: Diagnosis not present

## 2022-01-10 DIAGNOSIS — I871 Compression of vein: Secondary | ICD-10-CM | POA: Diagnosis not present

## 2022-01-10 DIAGNOSIS — N186 End stage renal disease: Secondary | ICD-10-CM | POA: Diagnosis not present

## 2022-01-10 DIAGNOSIS — Z992 Dependence on renal dialysis: Secondary | ICD-10-CM | POA: Diagnosis not present

## 2022-01-11 DIAGNOSIS — E559 Vitamin D deficiency, unspecified: Secondary | ICD-10-CM | POA: Diagnosis not present

## 2022-01-11 DIAGNOSIS — N250 Renal osteodystrophy: Secondary | ICD-10-CM | POA: Diagnosis not present

## 2022-01-11 DIAGNOSIS — N2581 Secondary hyperparathyroidism of renal origin: Secondary | ICD-10-CM | POA: Diagnosis not present

## 2022-01-11 DIAGNOSIS — Z992 Dependence on renal dialysis: Secondary | ICD-10-CM | POA: Diagnosis not present

## 2022-01-11 DIAGNOSIS — N186 End stage renal disease: Secondary | ICD-10-CM | POA: Diagnosis not present

## 2022-01-13 DIAGNOSIS — N250 Renal osteodystrophy: Secondary | ICD-10-CM | POA: Diagnosis not present

## 2022-01-13 DIAGNOSIS — N186 End stage renal disease: Secondary | ICD-10-CM | POA: Diagnosis not present

## 2022-01-13 DIAGNOSIS — E559 Vitamin D deficiency, unspecified: Secondary | ICD-10-CM | POA: Diagnosis not present

## 2022-01-13 DIAGNOSIS — N2581 Secondary hyperparathyroidism of renal origin: Secondary | ICD-10-CM | POA: Diagnosis not present

## 2022-01-13 DIAGNOSIS — Z992 Dependence on renal dialysis: Secondary | ICD-10-CM | POA: Diagnosis not present

## 2022-01-15 DIAGNOSIS — N186 End stage renal disease: Secondary | ICD-10-CM | POA: Diagnosis not present

## 2022-01-15 DIAGNOSIS — N250 Renal osteodystrophy: Secondary | ICD-10-CM | POA: Diagnosis not present

## 2022-01-15 DIAGNOSIS — E559 Vitamin D deficiency, unspecified: Secondary | ICD-10-CM | POA: Diagnosis not present

## 2022-01-15 DIAGNOSIS — Z992 Dependence on renal dialysis: Secondary | ICD-10-CM | POA: Diagnosis not present

## 2022-01-15 DIAGNOSIS — N2581 Secondary hyperparathyroidism of renal origin: Secondary | ICD-10-CM | POA: Diagnosis not present

## 2022-01-18 DIAGNOSIS — N250 Renal osteodystrophy: Secondary | ICD-10-CM | POA: Diagnosis not present

## 2022-01-18 DIAGNOSIS — N186 End stage renal disease: Secondary | ICD-10-CM | POA: Diagnosis not present

## 2022-01-18 DIAGNOSIS — Z992 Dependence on renal dialysis: Secondary | ICD-10-CM | POA: Diagnosis not present

## 2022-01-18 DIAGNOSIS — E559 Vitamin D deficiency, unspecified: Secondary | ICD-10-CM | POA: Diagnosis not present

## 2022-01-18 DIAGNOSIS — I10 Essential (primary) hypertension: Secondary | ICD-10-CM | POA: Diagnosis not present

## 2022-01-18 DIAGNOSIS — N2581 Secondary hyperparathyroidism of renal origin: Secondary | ICD-10-CM | POA: Diagnosis not present

## 2022-01-19 DIAGNOSIS — E1129 Type 2 diabetes mellitus with other diabetic kidney complication: Secondary | ICD-10-CM | POA: Diagnosis not present

## 2022-01-19 DIAGNOSIS — Z992 Dependence on renal dialysis: Secondary | ICD-10-CM | POA: Diagnosis not present

## 2022-01-19 DIAGNOSIS — N186 End stage renal disease: Secondary | ICD-10-CM | POA: Diagnosis not present

## 2022-01-19 DIAGNOSIS — N183 Chronic kidney disease, stage 3 unspecified: Secondary | ICD-10-CM | POA: Diagnosis not present

## 2022-01-19 DIAGNOSIS — E039 Hypothyroidism, unspecified: Secondary | ICD-10-CM | POA: Diagnosis not present

## 2022-01-19 DIAGNOSIS — J441 Chronic obstructive pulmonary disease with (acute) exacerbation: Secondary | ICD-10-CM | POA: Diagnosis not present

## 2022-01-20 DIAGNOSIS — E559 Vitamin D deficiency, unspecified: Secondary | ICD-10-CM | POA: Diagnosis not present

## 2022-01-20 DIAGNOSIS — N2581 Secondary hyperparathyroidism of renal origin: Secondary | ICD-10-CM | POA: Diagnosis not present

## 2022-01-20 DIAGNOSIS — N186 End stage renal disease: Secondary | ICD-10-CM | POA: Diagnosis not present

## 2022-01-20 DIAGNOSIS — Z992 Dependence on renal dialysis: Secondary | ICD-10-CM | POA: Diagnosis not present

## 2022-01-20 DIAGNOSIS — N250 Renal osteodystrophy: Secondary | ICD-10-CM | POA: Diagnosis not present

## 2022-01-21 ENCOUNTER — Ambulatory Visit: Payer: Medicare Other | Admitting: Internal Medicine

## 2022-01-22 DIAGNOSIS — Z992 Dependence on renal dialysis: Secondary | ICD-10-CM | POA: Diagnosis not present

## 2022-01-22 DIAGNOSIS — N2581 Secondary hyperparathyroidism of renal origin: Secondary | ICD-10-CM | POA: Diagnosis not present

## 2022-01-22 DIAGNOSIS — N250 Renal osteodystrophy: Secondary | ICD-10-CM | POA: Diagnosis not present

## 2022-01-22 DIAGNOSIS — E559 Vitamin D deficiency, unspecified: Secondary | ICD-10-CM | POA: Diagnosis not present

## 2022-01-22 DIAGNOSIS — N186 End stage renal disease: Secondary | ICD-10-CM | POA: Diagnosis not present

## 2022-01-25 DIAGNOSIS — Z992 Dependence on renal dialysis: Secondary | ICD-10-CM | POA: Diagnosis not present

## 2022-01-25 DIAGNOSIS — E559 Vitamin D deficiency, unspecified: Secondary | ICD-10-CM | POA: Diagnosis not present

## 2022-01-25 DIAGNOSIS — N250 Renal osteodystrophy: Secondary | ICD-10-CM | POA: Diagnosis not present

## 2022-01-25 DIAGNOSIS — N2581 Secondary hyperparathyroidism of renal origin: Secondary | ICD-10-CM | POA: Diagnosis not present

## 2022-01-25 DIAGNOSIS — N186 End stage renal disease: Secondary | ICD-10-CM | POA: Diagnosis not present

## 2022-01-26 DIAGNOSIS — Z01818 Encounter for other preprocedural examination: Secondary | ICD-10-CM | POA: Diagnosis not present

## 2022-01-27 DIAGNOSIS — N250 Renal osteodystrophy: Secondary | ICD-10-CM | POA: Diagnosis not present

## 2022-01-27 DIAGNOSIS — E559 Vitamin D deficiency, unspecified: Secondary | ICD-10-CM | POA: Diagnosis not present

## 2022-01-27 DIAGNOSIS — Z992 Dependence on renal dialysis: Secondary | ICD-10-CM | POA: Diagnosis not present

## 2022-01-27 DIAGNOSIS — N2581 Secondary hyperparathyroidism of renal origin: Secondary | ICD-10-CM | POA: Diagnosis not present

## 2022-01-27 DIAGNOSIS — N186 End stage renal disease: Secondary | ICD-10-CM | POA: Diagnosis not present

## 2022-01-29 DIAGNOSIS — N250 Renal osteodystrophy: Secondary | ICD-10-CM | POA: Diagnosis not present

## 2022-01-29 DIAGNOSIS — N2581 Secondary hyperparathyroidism of renal origin: Secondary | ICD-10-CM | POA: Diagnosis not present

## 2022-01-29 DIAGNOSIS — E559 Vitamin D deficiency, unspecified: Secondary | ICD-10-CM | POA: Diagnosis not present

## 2022-01-29 DIAGNOSIS — N186 End stage renal disease: Secondary | ICD-10-CM | POA: Diagnosis not present

## 2022-01-29 DIAGNOSIS — Z992 Dependence on renal dialysis: Secondary | ICD-10-CM | POA: Diagnosis not present

## 2022-02-01 DIAGNOSIS — N186 End stage renal disease: Secondary | ICD-10-CM | POA: Diagnosis not present

## 2022-02-01 DIAGNOSIS — E559 Vitamin D deficiency, unspecified: Secondary | ICD-10-CM | POA: Diagnosis not present

## 2022-02-01 DIAGNOSIS — N2581 Secondary hyperparathyroidism of renal origin: Secondary | ICD-10-CM | POA: Diagnosis not present

## 2022-02-01 DIAGNOSIS — Z992 Dependence on renal dialysis: Secondary | ICD-10-CM | POA: Diagnosis not present

## 2022-02-01 DIAGNOSIS — N250 Renal osteodystrophy: Secondary | ICD-10-CM | POA: Diagnosis not present

## 2022-02-03 DIAGNOSIS — Z992 Dependence on renal dialysis: Secondary | ICD-10-CM | POA: Diagnosis not present

## 2022-02-03 DIAGNOSIS — E559 Vitamin D deficiency, unspecified: Secondary | ICD-10-CM | POA: Diagnosis not present

## 2022-02-03 DIAGNOSIS — N250 Renal osteodystrophy: Secondary | ICD-10-CM | POA: Diagnosis not present

## 2022-02-03 DIAGNOSIS — N2581 Secondary hyperparathyroidism of renal origin: Secondary | ICD-10-CM | POA: Diagnosis not present

## 2022-02-03 DIAGNOSIS — N186 End stage renal disease: Secondary | ICD-10-CM | POA: Diagnosis not present

## 2022-02-05 DIAGNOSIS — N186 End stage renal disease: Secondary | ICD-10-CM | POA: Diagnosis not present

## 2022-02-05 DIAGNOSIS — N250 Renal osteodystrophy: Secondary | ICD-10-CM | POA: Diagnosis not present

## 2022-02-05 DIAGNOSIS — E559 Vitamin D deficiency, unspecified: Secondary | ICD-10-CM | POA: Diagnosis not present

## 2022-02-05 DIAGNOSIS — N2581 Secondary hyperparathyroidism of renal origin: Secondary | ICD-10-CM | POA: Diagnosis not present

## 2022-02-05 DIAGNOSIS — Z992 Dependence on renal dialysis: Secondary | ICD-10-CM | POA: Diagnosis not present

## 2022-02-08 DIAGNOSIS — N186 End stage renal disease: Secondary | ICD-10-CM | POA: Diagnosis not present

## 2022-02-08 DIAGNOSIS — N2581 Secondary hyperparathyroidism of renal origin: Secondary | ICD-10-CM | POA: Diagnosis not present

## 2022-02-08 DIAGNOSIS — N250 Renal osteodystrophy: Secondary | ICD-10-CM | POA: Diagnosis not present

## 2022-02-08 DIAGNOSIS — Z992 Dependence on renal dialysis: Secondary | ICD-10-CM | POA: Diagnosis not present

## 2022-02-08 DIAGNOSIS — E559 Vitamin D deficiency, unspecified: Secondary | ICD-10-CM | POA: Diagnosis not present

## 2022-02-11 DIAGNOSIS — N250 Renal osteodystrophy: Secondary | ICD-10-CM | POA: Diagnosis not present

## 2022-02-11 DIAGNOSIS — N186 End stage renal disease: Secondary | ICD-10-CM | POA: Diagnosis not present

## 2022-02-11 DIAGNOSIS — Z992 Dependence on renal dialysis: Secondary | ICD-10-CM | POA: Diagnosis not present

## 2022-02-11 DIAGNOSIS — N2581 Secondary hyperparathyroidism of renal origin: Secondary | ICD-10-CM | POA: Diagnosis not present

## 2022-02-11 DIAGNOSIS — E559 Vitamin D deficiency, unspecified: Secondary | ICD-10-CM | POA: Diagnosis not present

## 2022-02-12 DIAGNOSIS — N186 End stage renal disease: Secondary | ICD-10-CM | POA: Diagnosis not present

## 2022-02-12 DIAGNOSIS — N250 Renal osteodystrophy: Secondary | ICD-10-CM | POA: Diagnosis not present

## 2022-02-12 DIAGNOSIS — E559 Vitamin D deficiency, unspecified: Secondary | ICD-10-CM | POA: Diagnosis not present

## 2022-02-12 DIAGNOSIS — N2581 Secondary hyperparathyroidism of renal origin: Secondary | ICD-10-CM | POA: Diagnosis not present

## 2022-02-12 DIAGNOSIS — Z992 Dependence on renal dialysis: Secondary | ICD-10-CM | POA: Diagnosis not present

## 2022-02-15 DIAGNOSIS — E559 Vitamin D deficiency, unspecified: Secondary | ICD-10-CM | POA: Diagnosis not present

## 2022-02-15 DIAGNOSIS — Z992 Dependence on renal dialysis: Secondary | ICD-10-CM | POA: Diagnosis not present

## 2022-02-15 DIAGNOSIS — N2581 Secondary hyperparathyroidism of renal origin: Secondary | ICD-10-CM | POA: Diagnosis not present

## 2022-02-15 DIAGNOSIS — N186 End stage renal disease: Secondary | ICD-10-CM | POA: Diagnosis not present

## 2022-02-15 DIAGNOSIS — N250 Renal osteodystrophy: Secondary | ICD-10-CM | POA: Diagnosis not present

## 2022-02-17 DIAGNOSIS — N2581 Secondary hyperparathyroidism of renal origin: Secondary | ICD-10-CM | POA: Diagnosis not present

## 2022-02-17 DIAGNOSIS — N186 End stage renal disease: Secondary | ICD-10-CM | POA: Diagnosis not present

## 2022-02-17 DIAGNOSIS — I10 Essential (primary) hypertension: Secondary | ICD-10-CM | POA: Diagnosis not present

## 2022-02-17 DIAGNOSIS — N250 Renal osteodystrophy: Secondary | ICD-10-CM | POA: Diagnosis not present

## 2022-02-17 DIAGNOSIS — E559 Vitamin D deficiency, unspecified: Secondary | ICD-10-CM | POA: Diagnosis not present

## 2022-02-17 DIAGNOSIS — Z992 Dependence on renal dialysis: Secondary | ICD-10-CM | POA: Diagnosis not present

## 2022-02-18 DIAGNOSIS — Z992 Dependence on renal dialysis: Secondary | ICD-10-CM | POA: Diagnosis not present

## 2022-02-18 DIAGNOSIS — N186 End stage renal disease: Secondary | ICD-10-CM | POA: Diagnosis not present

## 2022-02-19 DIAGNOSIS — N186 End stage renal disease: Secondary | ICD-10-CM | POA: Diagnosis not present

## 2022-02-19 DIAGNOSIS — Z992 Dependence on renal dialysis: Secondary | ICD-10-CM | POA: Diagnosis not present

## 2022-02-19 DIAGNOSIS — N250 Renal osteodystrophy: Secondary | ICD-10-CM | POA: Diagnosis not present

## 2022-02-19 DIAGNOSIS — E559 Vitamin D deficiency, unspecified: Secondary | ICD-10-CM | POA: Diagnosis not present

## 2022-02-19 DIAGNOSIS — N2581 Secondary hyperparathyroidism of renal origin: Secondary | ICD-10-CM | POA: Diagnosis not present

## 2022-02-22 DIAGNOSIS — E559 Vitamin D deficiency, unspecified: Secondary | ICD-10-CM | POA: Diagnosis not present

## 2022-02-22 DIAGNOSIS — N2581 Secondary hyperparathyroidism of renal origin: Secondary | ICD-10-CM | POA: Diagnosis not present

## 2022-02-22 DIAGNOSIS — N186 End stage renal disease: Secondary | ICD-10-CM | POA: Diagnosis not present

## 2022-02-22 DIAGNOSIS — N250 Renal osteodystrophy: Secondary | ICD-10-CM | POA: Diagnosis not present

## 2022-02-22 DIAGNOSIS — Z992 Dependence on renal dialysis: Secondary | ICD-10-CM | POA: Diagnosis not present

## 2022-02-24 DIAGNOSIS — Z992 Dependence on renal dialysis: Secondary | ICD-10-CM | POA: Diagnosis not present

## 2022-02-24 DIAGNOSIS — N2581 Secondary hyperparathyroidism of renal origin: Secondary | ICD-10-CM | POA: Diagnosis not present

## 2022-02-24 DIAGNOSIS — N250 Renal osteodystrophy: Secondary | ICD-10-CM | POA: Diagnosis not present

## 2022-02-24 DIAGNOSIS — N186 End stage renal disease: Secondary | ICD-10-CM | POA: Diagnosis not present

## 2022-02-24 DIAGNOSIS — E559 Vitamin D deficiency, unspecified: Secondary | ICD-10-CM | POA: Diagnosis not present

## 2022-02-26 DIAGNOSIS — N186 End stage renal disease: Secondary | ICD-10-CM | POA: Diagnosis not present

## 2022-02-26 DIAGNOSIS — Z992 Dependence on renal dialysis: Secondary | ICD-10-CM | POA: Diagnosis not present

## 2022-02-26 DIAGNOSIS — N2581 Secondary hyperparathyroidism of renal origin: Secondary | ICD-10-CM | POA: Diagnosis not present

## 2022-02-26 DIAGNOSIS — E559 Vitamin D deficiency, unspecified: Secondary | ICD-10-CM | POA: Diagnosis not present

## 2022-02-26 DIAGNOSIS — N250 Renal osteodystrophy: Secondary | ICD-10-CM | POA: Diagnosis not present

## 2022-03-01 DIAGNOSIS — E559 Vitamin D deficiency, unspecified: Secondary | ICD-10-CM | POA: Diagnosis not present

## 2022-03-01 DIAGNOSIS — N2581 Secondary hyperparathyroidism of renal origin: Secondary | ICD-10-CM | POA: Diagnosis not present

## 2022-03-01 DIAGNOSIS — N186 End stage renal disease: Secondary | ICD-10-CM | POA: Diagnosis not present

## 2022-03-01 DIAGNOSIS — N250 Renal osteodystrophy: Secondary | ICD-10-CM | POA: Diagnosis not present

## 2022-03-01 DIAGNOSIS — Z992 Dependence on renal dialysis: Secondary | ICD-10-CM | POA: Diagnosis not present

## 2022-03-03 DIAGNOSIS — Z992 Dependence on renal dialysis: Secondary | ICD-10-CM | POA: Diagnosis not present

## 2022-03-03 DIAGNOSIS — N250 Renal osteodystrophy: Secondary | ICD-10-CM | POA: Diagnosis not present

## 2022-03-03 DIAGNOSIS — N186 End stage renal disease: Secondary | ICD-10-CM | POA: Diagnosis not present

## 2022-03-03 DIAGNOSIS — N2581 Secondary hyperparathyroidism of renal origin: Secondary | ICD-10-CM | POA: Diagnosis not present

## 2022-03-03 DIAGNOSIS — E559 Vitamin D deficiency, unspecified: Secondary | ICD-10-CM | POA: Diagnosis not present

## 2022-03-05 DIAGNOSIS — N250 Renal osteodystrophy: Secondary | ICD-10-CM | POA: Diagnosis not present

## 2022-03-05 DIAGNOSIS — N186 End stage renal disease: Secondary | ICD-10-CM | POA: Diagnosis not present

## 2022-03-05 DIAGNOSIS — N2581 Secondary hyperparathyroidism of renal origin: Secondary | ICD-10-CM | POA: Diagnosis not present

## 2022-03-05 DIAGNOSIS — Z992 Dependence on renal dialysis: Secondary | ICD-10-CM | POA: Diagnosis not present

## 2022-03-05 DIAGNOSIS — E559 Vitamin D deficiency, unspecified: Secondary | ICD-10-CM | POA: Diagnosis not present

## 2022-03-07 DIAGNOSIS — N2581 Secondary hyperparathyroidism of renal origin: Secondary | ICD-10-CM | POA: Diagnosis not present

## 2022-03-07 DIAGNOSIS — D62 Acute posthemorrhagic anemia: Secondary | ICD-10-CM | POA: Diagnosis not present

## 2022-03-07 DIAGNOSIS — Z4682 Encounter for fitting and adjustment of non-vascular catheter: Secondary | ICD-10-CM | POA: Diagnosis not present

## 2022-03-07 DIAGNOSIS — I701 Atherosclerosis of renal artery: Secondary | ICD-10-CM | POA: Diagnosis not present

## 2022-03-07 DIAGNOSIS — E119 Type 2 diabetes mellitus without complications: Secondary | ICD-10-CM | POA: Diagnosis not present

## 2022-03-07 DIAGNOSIS — Z992 Dependence on renal dialysis: Secondary | ICD-10-CM | POA: Diagnosis not present

## 2022-03-07 DIAGNOSIS — R578 Other shock: Secondary | ICD-10-CM | POA: Diagnosis not present

## 2022-03-07 DIAGNOSIS — I132 Hypertensive heart and chronic kidney disease with heart failure and with stage 5 chronic kidney disease, or end stage renal disease: Secondary | ICD-10-CM | POA: Diagnosis not present

## 2022-03-07 DIAGNOSIS — E1122 Type 2 diabetes mellitus with diabetic chronic kidney disease: Secondary | ICD-10-CM | POA: Diagnosis not present

## 2022-03-07 DIAGNOSIS — I5032 Chronic diastolic (congestive) heart failure: Secondary | ICD-10-CM | POA: Diagnosis not present

## 2022-03-07 DIAGNOSIS — E8770 Fluid overload, unspecified: Secondary | ICD-10-CM | POA: Diagnosis not present

## 2022-03-07 DIAGNOSIS — D631 Anemia in chronic kidney disease: Secondary | ICD-10-CM | POA: Diagnosis not present

## 2022-03-07 DIAGNOSIS — J449 Chronic obstructive pulmonary disease, unspecified: Secondary | ICD-10-CM | POA: Diagnosis not present

## 2022-03-07 DIAGNOSIS — N179 Acute kidney failure, unspecified: Secondary | ICD-10-CM | POA: Diagnosis not present

## 2022-03-07 DIAGNOSIS — Z781 Physical restraint status: Secondary | ICD-10-CM | POA: Diagnosis not present

## 2022-03-07 DIAGNOSIS — R000 Tachycardia, unspecified: Secondary | ICD-10-CM | POA: Diagnosis not present

## 2022-03-07 DIAGNOSIS — Z01818 Encounter for other preprocedural examination: Secondary | ICD-10-CM | POA: Diagnosis not present

## 2022-03-07 DIAGNOSIS — Z940 Kidney transplant status: Secondary | ICD-10-CM | POA: Diagnosis not present

## 2022-03-07 DIAGNOSIS — N261 Atrophy of kidney (terminal): Secondary | ICD-10-CM | POA: Diagnosis not present

## 2022-03-07 DIAGNOSIS — Z8249 Family history of ischemic heart disease and other diseases of the circulatory system: Secondary | ICD-10-CM | POA: Diagnosis not present

## 2022-03-07 DIAGNOSIS — R9431 Abnormal electrocardiogram [ECG] [EKG]: Secondary | ICD-10-CM | POA: Diagnosis not present

## 2022-03-07 DIAGNOSIS — Z79899 Other long term (current) drug therapy: Secondary | ICD-10-CM | POA: Diagnosis not present

## 2022-03-07 DIAGNOSIS — I1311 Hypertensive heart and chronic kidney disease without heart failure, with stage 5 chronic kidney disease, or end stage renal disease: Secondary | ICD-10-CM | POA: Diagnosis not present

## 2022-03-07 DIAGNOSIS — Z7982 Long term (current) use of aspirin: Secondary | ICD-10-CM | POA: Diagnosis not present

## 2022-03-07 DIAGNOSIS — N269 Renal sclerosis, unspecified: Secondary | ICD-10-CM | POA: Diagnosis not present

## 2022-03-07 DIAGNOSIS — I10 Essential (primary) hypertension: Secondary | ICD-10-CM | POA: Diagnosis not present

## 2022-03-07 DIAGNOSIS — Z9911 Dependence on respirator [ventilator] status: Secondary | ICD-10-CM | POA: Diagnosis not present

## 2022-03-07 DIAGNOSIS — Z5181 Encounter for therapeutic drug level monitoring: Secondary | ICD-10-CM | POA: Diagnosis not present

## 2022-03-07 DIAGNOSIS — T8610 Unspecified complication of kidney transplant: Secondary | ICD-10-CM | POA: Diagnosis not present

## 2022-03-07 DIAGNOSIS — E875 Hyperkalemia: Secondary | ICD-10-CM | POA: Diagnosis not present

## 2022-03-07 DIAGNOSIS — E785 Hyperlipidemia, unspecified: Secondary | ICD-10-CM | POA: Diagnosis not present

## 2022-03-07 DIAGNOSIS — D696 Thrombocytopenia, unspecified: Secondary | ICD-10-CM | POA: Diagnosis not present

## 2022-03-07 DIAGNOSIS — J4520 Mild intermittent asthma, uncomplicated: Secondary | ICD-10-CM | POA: Diagnosis not present

## 2022-03-07 DIAGNOSIS — Z794 Long term (current) use of insulin: Secondary | ICD-10-CM | POA: Diagnosis not present

## 2022-03-07 DIAGNOSIS — N186 End stage renal disease: Secondary | ICD-10-CM | POA: Diagnosis not present

## 2022-03-07 DIAGNOSIS — Z20822 Contact with and (suspected) exposure to covid-19: Secondary | ICD-10-CM | POA: Diagnosis not present

## 2022-03-07 DIAGNOSIS — T8619 Other complication of kidney transplant: Secondary | ICD-10-CM | POA: Diagnosis not present

## 2022-03-07 DIAGNOSIS — I120 Hypertensive chronic kidney disease with stage 5 chronic kidney disease or end stage renal disease: Secondary | ICD-10-CM | POA: Diagnosis not present

## 2022-03-07 DIAGNOSIS — I82611 Acute embolism and thrombosis of superficial veins of right upper extremity: Secondary | ICD-10-CM | POA: Diagnosis not present

## 2022-03-07 DIAGNOSIS — Z833 Family history of diabetes mellitus: Secondary | ICD-10-CM | POA: Diagnosis not present

## 2022-03-07 DIAGNOSIS — R58 Hemorrhage, not elsewhere classified: Secondary | ICD-10-CM | POA: Diagnosis not present

## 2022-03-07 DIAGNOSIS — K91841 Postprocedural hemorrhage and hematoma of a digestive system organ or structure following other procedure: Secondary | ICD-10-CM | POA: Diagnosis not present

## 2022-03-07 DIAGNOSIS — Z7984 Long term (current) use of oral hypoglycemic drugs: Secondary | ICD-10-CM | POA: Diagnosis not present

## 2022-03-07 DIAGNOSIS — Z4822 Encounter for aftercare following kidney transplant: Secondary | ICD-10-CM | POA: Diagnosis not present

## 2022-03-08 DIAGNOSIS — Z940 Kidney transplant status: Secondary | ICD-10-CM | POA: Diagnosis not present

## 2022-03-08 DIAGNOSIS — I10 Essential (primary) hypertension: Secondary | ICD-10-CM | POA: Diagnosis not present

## 2022-03-08 DIAGNOSIS — E875 Hyperkalemia: Secondary | ICD-10-CM | POA: Diagnosis not present

## 2022-03-08 DIAGNOSIS — Z4822 Encounter for aftercare following kidney transplant: Secondary | ICD-10-CM | POA: Diagnosis not present

## 2022-03-08 DIAGNOSIS — Z9911 Dependence on respirator [ventilator] status: Secondary | ICD-10-CM | POA: Diagnosis not present

## 2022-03-09 DIAGNOSIS — E875 Hyperkalemia: Secondary | ICD-10-CM | POA: Diagnosis not present

## 2022-03-09 DIAGNOSIS — Z940 Kidney transplant status: Secondary | ICD-10-CM | POA: Diagnosis not present

## 2022-03-09 DIAGNOSIS — I10 Essential (primary) hypertension: Secondary | ICD-10-CM | POA: Diagnosis not present

## 2022-03-09 DIAGNOSIS — E8770 Fluid overload, unspecified: Secondary | ICD-10-CM | POA: Diagnosis not present

## 2022-03-10 DIAGNOSIS — Z4822 Encounter for aftercare following kidney transplant: Secondary | ICD-10-CM | POA: Diagnosis not present

## 2022-03-10 DIAGNOSIS — Z940 Kidney transplant status: Secondary | ICD-10-CM | POA: Diagnosis not present

## 2022-03-12 DIAGNOSIS — I82611 Acute embolism and thrombosis of superficial veins of right upper extremity: Secondary | ICD-10-CM | POA: Diagnosis not present

## 2022-03-12 DIAGNOSIS — Z940 Kidney transplant status: Secondary | ICD-10-CM | POA: Diagnosis not present

## 2022-03-13 DIAGNOSIS — Z940 Kidney transplant status: Secondary | ICD-10-CM | POA: Diagnosis not present

## 2022-03-16 DIAGNOSIS — Z940 Kidney transplant status: Secondary | ICD-10-CM | POA: Diagnosis not present

## 2022-03-16 DIAGNOSIS — R739 Hyperglycemia, unspecified: Secondary | ICD-10-CM | POA: Diagnosis not present

## 2022-03-16 DIAGNOSIS — Z4822 Encounter for aftercare following kidney transplant: Secondary | ICD-10-CM | POA: Diagnosis not present

## 2022-03-16 DIAGNOSIS — Z79899 Other long term (current) drug therapy: Secondary | ICD-10-CM | POA: Diagnosis not present

## 2022-03-16 DIAGNOSIS — T8619 Other complication of kidney transplant: Secondary | ICD-10-CM | POA: Diagnosis not present

## 2022-03-16 DIAGNOSIS — E1165 Type 2 diabetes mellitus with hyperglycemia: Secondary | ICD-10-CM | POA: Diagnosis not present

## 2022-03-16 DIAGNOSIS — K589 Irritable bowel syndrome without diarrhea: Secondary | ICD-10-CM | POA: Diagnosis not present

## 2022-03-16 DIAGNOSIS — E1140 Type 2 diabetes mellitus with diabetic neuropathy, unspecified: Secondary | ICD-10-CM | POA: Diagnosis not present

## 2022-03-16 DIAGNOSIS — E785 Hyperlipidemia, unspecified: Secondary | ICD-10-CM | POA: Diagnosis not present

## 2022-03-16 DIAGNOSIS — Z79621 Long term (current) use of calcineurin inhibitor: Secondary | ICD-10-CM | POA: Diagnosis not present

## 2022-03-16 DIAGNOSIS — D849 Immunodeficiency, unspecified: Secondary | ICD-10-CM | POA: Diagnosis not present

## 2022-03-16 DIAGNOSIS — Z87891 Personal history of nicotine dependence: Secondary | ICD-10-CM | POA: Diagnosis not present

## 2022-03-16 DIAGNOSIS — I10 Essential (primary) hypertension: Secondary | ICD-10-CM | POA: Diagnosis not present

## 2022-03-16 DIAGNOSIS — J449 Chronic obstructive pulmonary disease, unspecified: Secondary | ICD-10-CM | POA: Diagnosis not present

## 2022-03-16 DIAGNOSIS — Z5181 Encounter for therapeutic drug level monitoring: Secondary | ICD-10-CM | POA: Diagnosis not present

## 2022-03-16 DIAGNOSIS — I110 Hypertensive heart disease with heart failure: Secondary | ICD-10-CM | POA: Diagnosis not present

## 2022-03-16 DIAGNOSIS — Z792 Long term (current) use of antibiotics: Secondary | ICD-10-CM | POA: Diagnosis not present

## 2022-03-16 DIAGNOSIS — Z794 Long term (current) use of insulin: Secondary | ICD-10-CM | POA: Diagnosis not present

## 2022-03-16 DIAGNOSIS — D509 Iron deficiency anemia, unspecified: Secondary | ICD-10-CM | POA: Diagnosis not present

## 2022-03-16 DIAGNOSIS — I5032 Chronic diastolic (congestive) heart failure: Secondary | ICD-10-CM | POA: Diagnosis not present

## 2022-03-16 DIAGNOSIS — Z7952 Long term (current) use of systemic steroids: Secondary | ICD-10-CM | POA: Diagnosis not present

## 2022-03-16 DIAGNOSIS — M79601 Pain in right arm: Secondary | ICD-10-CM | POA: Diagnosis not present

## 2022-03-16 DIAGNOSIS — J4520 Mild intermittent asthma, uncomplicated: Secondary | ICD-10-CM | POA: Diagnosis not present

## 2022-03-16 DIAGNOSIS — Z79624 Long term (current) use of inhibitors of nucleotide synthesis: Secondary | ICD-10-CM | POA: Diagnosis not present

## 2022-03-16 DIAGNOSIS — Z992 Dependence on renal dialysis: Secondary | ICD-10-CM | POA: Diagnosis not present

## 2022-03-17 DIAGNOSIS — J4520 Mild intermittent asthma, uncomplicated: Secondary | ICD-10-CM | POA: Diagnosis not present

## 2022-03-17 DIAGNOSIS — Z5181 Encounter for therapeutic drug level monitoring: Secondary | ICD-10-CM | POA: Diagnosis not present

## 2022-03-17 DIAGNOSIS — D849 Immunodeficiency, unspecified: Secondary | ICD-10-CM | POA: Diagnosis not present

## 2022-03-17 DIAGNOSIS — Z940 Kidney transplant status: Secondary | ICD-10-CM | POA: Diagnosis not present

## 2022-03-17 DIAGNOSIS — Z79621 Long term (current) use of calcineurin inhibitor: Secondary | ICD-10-CM | POA: Diagnosis not present

## 2022-03-17 DIAGNOSIS — E1140 Type 2 diabetes mellitus with diabetic neuropathy, unspecified: Secondary | ICD-10-CM | POA: Diagnosis not present

## 2022-03-17 DIAGNOSIS — I110 Hypertensive heart disease with heart failure: Secondary | ICD-10-CM | POA: Diagnosis not present

## 2022-03-17 DIAGNOSIS — E119 Type 2 diabetes mellitus without complications: Secondary | ICD-10-CM | POA: Diagnosis not present

## 2022-03-17 DIAGNOSIS — R739 Hyperglycemia, unspecified: Secondary | ICD-10-CM | POA: Diagnosis not present

## 2022-03-17 DIAGNOSIS — S40821A Blister (nonthermal) of right upper arm, initial encounter: Secondary | ICD-10-CM | POA: Diagnosis not present

## 2022-03-17 DIAGNOSIS — Z87891 Personal history of nicotine dependence: Secondary | ICD-10-CM | POA: Diagnosis not present

## 2022-03-17 DIAGNOSIS — K589 Irritable bowel syndrome without diarrhea: Secondary | ICD-10-CM | POA: Diagnosis not present

## 2022-03-17 DIAGNOSIS — Z794 Long term (current) use of insulin: Secondary | ICD-10-CM | POA: Diagnosis not present

## 2022-03-17 DIAGNOSIS — J449 Chronic obstructive pulmonary disease, unspecified: Secondary | ICD-10-CM | POA: Diagnosis not present

## 2022-03-17 DIAGNOSIS — D509 Iron deficiency anemia, unspecified: Secondary | ICD-10-CM | POA: Diagnosis not present

## 2022-03-17 DIAGNOSIS — Z79899 Other long term (current) drug therapy: Secondary | ICD-10-CM | POA: Diagnosis not present

## 2022-03-17 DIAGNOSIS — Z992 Dependence on renal dialysis: Secondary | ICD-10-CM | POA: Diagnosis not present

## 2022-03-17 DIAGNOSIS — I5032 Chronic diastolic (congestive) heart failure: Secondary | ICD-10-CM | POA: Diagnosis not present

## 2022-03-17 DIAGNOSIS — Z79624 Long term (current) use of inhibitors of nucleotide synthesis: Secondary | ICD-10-CM | POA: Diagnosis not present

## 2022-03-17 DIAGNOSIS — M79601 Pain in right arm: Secondary | ICD-10-CM | POA: Diagnosis not present

## 2022-03-17 DIAGNOSIS — T801XXA Vascular complications following infusion, transfusion and therapeutic injection, initial encounter: Secondary | ICD-10-CM | POA: Diagnosis not present

## 2022-03-17 DIAGNOSIS — Z4822 Encounter for aftercare following kidney transplant: Secondary | ICD-10-CM | POA: Diagnosis not present

## 2022-03-17 DIAGNOSIS — Z7952 Long term (current) use of systemic steroids: Secondary | ICD-10-CM | POA: Diagnosis not present

## 2022-03-17 DIAGNOSIS — E785 Hyperlipidemia, unspecified: Secondary | ICD-10-CM | POA: Diagnosis not present

## 2022-03-17 DIAGNOSIS — Z792 Long term (current) use of antibiotics: Secondary | ICD-10-CM | POA: Diagnosis not present

## 2022-03-17 DIAGNOSIS — I10 Essential (primary) hypertension: Secondary | ICD-10-CM | POA: Diagnosis not present

## 2022-03-18 DIAGNOSIS — R7989 Other specified abnormal findings of blood chemistry: Secondary | ICD-10-CM | POA: Diagnosis not present

## 2022-03-18 DIAGNOSIS — D849 Immunodeficiency, unspecified: Secondary | ICD-10-CM | POA: Diagnosis not present

## 2022-03-18 DIAGNOSIS — Z794 Long term (current) use of insulin: Secondary | ICD-10-CM | POA: Diagnosis not present

## 2022-03-18 DIAGNOSIS — E1165 Type 2 diabetes mellitus with hyperglycemia: Secondary | ICD-10-CM | POA: Diagnosis not present

## 2022-03-18 DIAGNOSIS — N186 End stage renal disease: Secondary | ICD-10-CM | POA: Diagnosis not present

## 2022-03-18 DIAGNOSIS — E119 Type 2 diabetes mellitus without complications: Secondary | ICD-10-CM | POA: Diagnosis not present

## 2022-03-18 DIAGNOSIS — T8619 Other complication of kidney transplant: Secondary | ICD-10-CM | POA: Diagnosis not present

## 2022-03-18 DIAGNOSIS — Z940 Kidney transplant status: Secondary | ICD-10-CM | POA: Diagnosis not present

## 2022-03-18 DIAGNOSIS — I10 Essential (primary) hypertension: Secondary | ICD-10-CM | POA: Diagnosis not present

## 2022-03-18 DIAGNOSIS — E785 Hyperlipidemia, unspecified: Secondary | ICD-10-CM | POA: Diagnosis not present

## 2022-03-18 DIAGNOSIS — Z4822 Encounter for aftercare following kidney transplant: Secondary | ICD-10-CM | POA: Diagnosis not present

## 2022-03-18 DIAGNOSIS — D649 Anemia, unspecified: Secondary | ICD-10-CM | POA: Diagnosis not present

## 2022-03-21 DIAGNOSIS — M79603 Pain in arm, unspecified: Secondary | ICD-10-CM | POA: Diagnosis not present

## 2022-03-21 DIAGNOSIS — E876 Hypokalemia: Secondary | ICD-10-CM | POA: Diagnosis not present

## 2022-03-21 DIAGNOSIS — Z940 Kidney transplant status: Secondary | ICD-10-CM | POA: Diagnosis not present

## 2022-03-21 DIAGNOSIS — E119 Type 2 diabetes mellitus without complications: Secondary | ICD-10-CM | POA: Diagnosis not present

## 2022-03-21 DIAGNOSIS — Z794 Long term (current) use of insulin: Secondary | ICD-10-CM | POA: Diagnosis not present

## 2022-03-21 DIAGNOSIS — I110 Hypertensive heart disease with heart failure: Secondary | ICD-10-CM | POA: Diagnosis not present

## 2022-03-21 DIAGNOSIS — Z882 Allergy status to sulfonamides status: Secondary | ICD-10-CM | POA: Diagnosis not present

## 2022-03-21 DIAGNOSIS — Z4822 Encounter for aftercare following kidney transplant: Secondary | ICD-10-CM | POA: Diagnosis not present

## 2022-03-21 DIAGNOSIS — Z79899 Other long term (current) drug therapy: Secondary | ICD-10-CM | POA: Diagnosis not present

## 2022-03-21 DIAGNOSIS — I10 Essential (primary) hypertension: Secondary | ICD-10-CM | POA: Diagnosis not present

## 2022-03-21 DIAGNOSIS — K580 Irritable bowel syndrome with diarrhea: Secondary | ICD-10-CM | POA: Diagnosis not present

## 2022-03-21 DIAGNOSIS — Z79624 Long term (current) use of inhibitors of nucleotide synthesis: Secondary | ICD-10-CM | POA: Diagnosis not present

## 2022-03-21 DIAGNOSIS — D509 Iron deficiency anemia, unspecified: Secondary | ICD-10-CM | POA: Diagnosis not present

## 2022-03-21 DIAGNOSIS — E8809 Other disorders of plasma-protein metabolism, not elsewhere classified: Secondary | ICD-10-CM | POA: Diagnosis not present

## 2022-03-21 DIAGNOSIS — J449 Chronic obstructive pulmonary disease, unspecified: Secondary | ICD-10-CM | POA: Diagnosis not present

## 2022-03-21 DIAGNOSIS — E785 Hyperlipidemia, unspecified: Secondary | ICD-10-CM | POA: Diagnosis not present

## 2022-03-21 DIAGNOSIS — Z792 Long term (current) use of antibiotics: Secondary | ICD-10-CM | POA: Diagnosis not present

## 2022-03-21 DIAGNOSIS — T8619 Other complication of kidney transplant: Secondary | ICD-10-CM | POA: Diagnosis not present

## 2022-03-21 DIAGNOSIS — I5032 Chronic diastolic (congestive) heart failure: Secondary | ICD-10-CM | POA: Diagnosis not present

## 2022-03-21 DIAGNOSIS — Z79621 Long term (current) use of calcineurin inhibitor: Secondary | ICD-10-CM | POA: Diagnosis not present

## 2022-03-21 DIAGNOSIS — D849 Immunodeficiency, unspecified: Secondary | ICD-10-CM | POA: Diagnosis not present

## 2022-03-21 DIAGNOSIS — Z7952 Long term (current) use of systemic steroids: Secondary | ICD-10-CM | POA: Diagnosis not present

## 2022-03-22 NOTE — Progress Notes (Deleted)
Referring Provider: Monico Blitz, MD Primary Care Physician:  Monico Blitz, MD Primary GI Physician: Dr. Gala Romney  No chief complaint on file.   HPI:   Cindy Park is a 66 y.o. female with chronic history of GERD, CKD, IDA.  She was initially seen back in 2021 at the request of her nephrologist, Dr. Theador Hawthorne, for IDA.  She receives IV iron infusions as needed and Epogen.  She completed colonoscopy in August 2021 revealing 2 tubular adenomas with recommendations to repeat in 5 years.  She later underwent upper endoscopy in April 2022 revealing 1 gastric polyp, patchy gastric erythema biopsied, otherwise normal exam.  We have not seen patient since her procedure.  She has canceled numerous office visits.  In the interim, patient underwent peritoneal dialysis catheter removal (due to dialysis catheter dysfunction), laparoscopic lysis of adhesions, laparoscopic to open small bowel resection in 2021-07-29.  She also underwent deceased donor kidney transplant to the right pelvis on 03/07/2022 with [facility]. The post-operative course was complicated by a bleeding event with hypotension on POD0 requiring exploratory laparotomy. She also developed acute SVT of right cephalic vein/arm wound, referred to wound clinic and per plastic surgery recommendations, has appointment scheduled for 03/28/2022.  Prior to surgery, hemoglobin was 9.3 in February 2023, 12.8 in May 2023, 11.4 on day of surgery 03/07/2022.  Hemoglobin 9.2 on day of discharge, 7/24.  Most recent labs 03/22/2022 with hemoglobin 8.3.   Last iron panel on file from 07/29/2021 with iron 157, saturation 39%, ferritin 1015.  Today:    Past Medical History:  Diagnosis Date   Anemia    Arthritis    Asthma    Cataract    Combined form OU   COPD (chronic obstructive pulmonary disease) ([place])    Diabetes mellitus without complication (HCC)    Diabetic retinopathy ([place])    BDR OU   Family history of adverse reaction to anesthesia     sister- difficult to wake up from anesthesia   Fluid excess    GERD (gastroesophageal reflux disease)    Goiter    Hypertension    Hypertensive retinopathy    OU   Renal failure     Past Surgical History:  Procedure Laterality Date   ABDOMINAL HYSTERECTOMY     AV FISTULA PLACEMENT Left 06/11/2020   Procedure: LEFT ARM ARTERIOVENOUS (AV) FISTULA CREATION;  Surgeon: [name], MD;  Location: [facility];  Service: Vascular;  Laterality: Left;   BASCILIC VEIN TRANSPOSITION Left 08/06/2020   Procedure: LEFT ARM SECOND STAGE [place];  Surgeon: [name], MD;  Location: [facility];  Service: Vascular;  Laterality: Left;   BIOPSY  12/02/2020   Procedure: BIOPSY;  Surgeon: Daneil Dolin, MD;  Location: [facility];  Service: Endoscopy;;  gastric   CHOLECYSTECTOMY     COLONOSCOPY N/A 03/25/2020   [place]: two tubular adenomas removed. next colonoscopy five years   ESOPHAGOGASTRODUODENOSCOPY N/A 12/02/2020   Procedure: ESOPHAGOGASTRODUODENOSCOPY (EGD);  Surgeon: Daneil Dolin, MD;  Location: [facility];  Service: Endoscopy;  Laterality: N/A;  am appt   POLYPECTOMY  03/25/2020   Procedure: POLYPECTOMY;  Surgeon: Daneil Dolin, MD;  Location: [facility];  Service: Endoscopy;;   POLYPECTOMY  12/02/2020   Procedure: POLYPECTOMY;  Surgeon: Daneil Dolin, MD;  Location: [facility];  Service: Endoscopy;;  gastric    Current Outpatient Medications  Medication Sig Dispense Refill   acetaminophen (TYLENOL) 500 MG  tablet Take 1,000 mg by mouth every 6 (six) hours as needed for moderate pain.      albuterol (PROVENTIL) (2.5 MG/3ML) 0.083% nebulizer solution Take 2.5 mg by nebulization 4 (four) times daily.      albuterol (VENTOLIN HFA) 108 (90 Base) MCG/ACT inhaler Inhale 2 puffs into the lungs every 6 (six) hours as needed for wheezing or shortness of breath.     amLODipine (NORVASC) 5 MG tablet Take 5 mg by mouth 2 (two) times daily.     aspirin EC 81 MG tablet Take  81 mg by mouth in the morning.     atorvastatin (LIPITOR) 40 MG tablet Take 40 mg by mouth at bedtime.      calcitRIOL (ROCALTROL) 0.5 MCG capsule Take 0.5 mcg by mouth in the morning.     carvedilol (COREG) 25 MG tablet Take 0.5 tablets (12.5 mg total) by mouth 2 (two) times daily with a meal.     Cholecalciferol (VITAMIN D3) 50 MCG (2000 UT) TABS Take 2,000 Units by mouth every evening.     clonazePAM (KLONOPIN) 0.5 MG tablet Take 0.5 mg by mouth 2 (two) times daily as needed for anxiety.     dicyclomine (BENTYL) 10 MG capsule Take 10 mg by mouth 2 (two) times daily.      docusate sodium (COLACE) 100 MG capsule Take 200 mg by mouth daily.     epoetin alfa (EPOGEN) 3000 UNIT/ML injection Inject 3,000 Units into the skin every 14 (fourteen) days.      epoetin alfa-epbx (RETACRIT) 29937 UNIT/ML injection 6,000 Units every 14 (fourteen) days.     ferrous sulfate 325 (65 FE) MG tablet Take 325 mg by mouth 2 (two) times daily with a meal.     Fluticasone-Umeclidin-Vilant (TRELEGY ELLIPTA) 100-62.5-25 MCG/INH AEPB Inhale 1 puff into the lungs daily.     furosemide (LASIX) 80 MG tablet Take 80 mg by mouth 2 (two) times daily.     gabapentin (NEURONTIN) 300 MG capsule Take 300 mg by mouth 2 (two) times daily.     gemfibrozil (LOPID) 600 MG tablet Take 600 mg by mouth in the morning and at bedtime.     Insulin Lispro Prot & Lispro (HUMALOG MIX 75/25 [place]) Inject 15-60 Units into the skin See admin instructions. Inject 60 units into the skin in the morning and 15 units at lunch and 60 units at supper     levothyroxine (SYNTHROID) 50 MCG tablet Take 50 mcg by mouth daily before breakfast.      montelukast (SINGULAIR) 10 MG tablet Take 10 mg by mouth at bedtime.     omeprazole (PRILOSEC) 20 MG capsule Take 20 mg by mouth every evening.     ondansetron (ZOFRAN) 4 MG tablet Take 4 mg by mouth every 8 (eight) hours as needed for nausea or vomiting.     sevelamer carbonate (RENVELA) 800 MG tablet Take 800 mg by  mouth 3 (three) times daily with meals.     sitaGLIPtin (JANUVIA) 25 MG tablet Take 25 mg by mouth in the morning.     sodium bicarbonate 650 MG tablet Take 650 mg by mouth 2 (two) times daily.     triamcinolone (KENALOG) 0.1 % Apply 1 application topically daily as needed (skin irritation). To feet     Vitamin A 2400 MCG (8000 UT) TABS Take 2,400 mcg by mouth every evening.     vitamin B-12 (CYANOCOBALAMIN) 500 MCG tablet Take 500 mcg by mouth in the morning.  vitamin E 180 MG (400 UNITS) capsule Take 400 Units by mouth daily.     No current facility-administered medications for this visit.    Allergies as of 03/23/2022 - Review Complete 05/31/2021  Allergen Reaction Noted   Latex Shortness Of Breath and Rash 06/11/2020   Niacin Other (See Comments)    Other Dermatitis 03/13/2020   Tomato Other (See Comments) 03/13/2020   Bactrim [sulfamethoxazole-trimethoprim] Swelling and Rash 01/24/2020    Family History  Problem Relation Age of Onset   Kidney disease Mother        ESRD   Kidney disease Sister        ESRD   Kidney disease Maternal Grandmother        ESRD   Colon polyps Brother 37   Breast cancer Other    Colon cancer Neg Hx     Social History   Socioeconomic History   Marital status: Married    Spouse name: Not on file   Number of children: Not on file   Years of education: Not on file   Highest education level: Not on file  Occupational History   Not on file  Tobacco Use   Smoking status: Never   Smokeless tobacco: Never  Vaping Use   Vaping Use: Never used  Substance and Sexual Activity   Alcohol use: No   Drug use: No   Sexual activity: Yes  Other Topics Concern   Not on file  Social History Narrative   Not on file   Social Determinants of Health   Financial Resource Strain: Not on file  Food Insecurity: Not on file  Transportation Needs: Not on file  Physical Activity: Not on file  Stress: Not on file  Social Connections: Not on file     Review of Systems: Gen: Denies fever, chills, anorexia. Denies fatigue, weakness, weight loss.  CV: Denies chest pain, palpitations. Resp: Denies dyspnea, cough.  GI: See HPI Heme: See HPI  Physical Exam: There were no vitals taken for this visit. General:   Alert and oriented. No distress noted. Pleasant and cooperative.  Head:  Normocephalic and atraumatic. Eyes:  Conjuctiva clear without scleral icterus. Heart:  S1, S2 present without murmurs appreciated. Lungs:  Clear to auscultation bilaterally. No wheezes, rales, or rhonchi. No distress.  Abdomen:  +BS, soft, non-tender and non-distended. No rebound or guarding. No HSM or masses noted. Msk:  Symmetrical without gross deformities. Normal posture. Extremities:  Without edema. Neurologic:  Alert and  oriented x4 Psych:  Normal mood and affect.    Assessment:     Plan:  ***   Aliene Altes, PA-C [facility] 03/23/2022

## 2022-03-23 ENCOUNTER — Ambulatory Visit: Payer: Medicare Other | Admitting: Gastroenterology

## 2022-03-23 DIAGNOSIS — Z79624 Long term (current) use of inhibitors of nucleotide synthesis: Secondary | ICD-10-CM | POA: Diagnosis not present

## 2022-03-23 DIAGNOSIS — I5032 Chronic diastolic (congestive) heart failure: Secondary | ICD-10-CM | POA: Diagnosis not present

## 2022-03-23 DIAGNOSIS — E785 Hyperlipidemia, unspecified: Secondary | ICD-10-CM | POA: Diagnosis not present

## 2022-03-23 DIAGNOSIS — K589 Irritable bowel syndrome without diarrhea: Secondary | ICD-10-CM | POA: Diagnosis not present

## 2022-03-23 DIAGNOSIS — Z7952 Long term (current) use of systemic steroids: Secondary | ICD-10-CM | POA: Diagnosis not present

## 2022-03-23 DIAGNOSIS — Z79899 Other long term (current) drug therapy: Secondary | ICD-10-CM | POA: Diagnosis not present

## 2022-03-23 DIAGNOSIS — D84821 Immunodeficiency due to drugs: Secondary | ICD-10-CM | POA: Diagnosis not present

## 2022-03-23 DIAGNOSIS — Z794 Long term (current) use of insulin: Secondary | ICD-10-CM | POA: Diagnosis not present

## 2022-03-23 DIAGNOSIS — Z940 Kidney transplant status: Secondary | ICD-10-CM | POA: Diagnosis not present

## 2022-03-23 DIAGNOSIS — I110 Hypertensive heart disease with heart failure: Secondary | ICD-10-CM | POA: Diagnosis not present

## 2022-03-23 DIAGNOSIS — M79603 Pain in arm, unspecified: Secondary | ICD-10-CM | POA: Diagnosis not present

## 2022-03-23 DIAGNOSIS — E876 Hypokalemia: Secondary | ICD-10-CM | POA: Diagnosis not present

## 2022-03-23 DIAGNOSIS — Z4822 Encounter for aftercare following kidney transplant: Secondary | ICD-10-CM | POA: Diagnosis not present

## 2022-03-23 DIAGNOSIS — J449 Chronic obstructive pulmonary disease, unspecified: Secondary | ICD-10-CM | POA: Diagnosis not present

## 2022-03-23 DIAGNOSIS — J4520 Mild intermittent asthma, uncomplicated: Secondary | ICD-10-CM | POA: Diagnosis not present

## 2022-03-23 DIAGNOSIS — D509 Iron deficiency anemia, unspecified: Secondary | ICD-10-CM | POA: Diagnosis not present

## 2022-03-23 DIAGNOSIS — Z87891 Personal history of nicotine dependence: Secondary | ICD-10-CM | POA: Diagnosis not present

## 2022-03-23 DIAGNOSIS — E119 Type 2 diabetes mellitus without complications: Secondary | ICD-10-CM | POA: Diagnosis not present

## 2022-03-23 DIAGNOSIS — Z79621 Long term (current) use of calcineurin inhibitor: Secondary | ICD-10-CM | POA: Diagnosis not present

## 2022-03-25 DIAGNOSIS — E876 Hypokalemia: Secondary | ICD-10-CM | POA: Diagnosis not present

## 2022-03-25 DIAGNOSIS — E785 Hyperlipidemia, unspecified: Secondary | ICD-10-CM | POA: Diagnosis not present

## 2022-03-25 DIAGNOSIS — S41101D Unspecified open wound of right upper arm, subsequent encounter: Secondary | ICD-10-CM | POA: Diagnosis not present

## 2022-03-25 DIAGNOSIS — Z79899 Other long term (current) drug therapy: Secondary | ICD-10-CM | POA: Diagnosis not present

## 2022-03-25 DIAGNOSIS — I10 Essential (primary) hypertension: Secondary | ICD-10-CM | POA: Diagnosis not present

## 2022-03-25 DIAGNOSIS — Z794 Long term (current) use of insulin: Secondary | ICD-10-CM | POA: Diagnosis not present

## 2022-03-25 DIAGNOSIS — Z792 Long term (current) use of antibiotics: Secondary | ICD-10-CM | POA: Diagnosis not present

## 2022-03-25 DIAGNOSIS — Z79624 Long term (current) use of inhibitors of nucleotide synthesis: Secondary | ICD-10-CM | POA: Diagnosis not present

## 2022-03-25 DIAGNOSIS — I82611 Acute embolism and thrombosis of superficial veins of right upper extremity: Secondary | ICD-10-CM | POA: Diagnosis not present

## 2022-03-25 DIAGNOSIS — Z87448 Personal history of other diseases of urinary system: Secondary | ICD-10-CM | POA: Diagnosis not present

## 2022-03-25 DIAGNOSIS — E039 Hypothyroidism, unspecified: Secondary | ICD-10-CM | POA: Diagnosis not present

## 2022-03-25 DIAGNOSIS — L304 Erythema intertrigo: Secondary | ICD-10-CM | POA: Diagnosis not present

## 2022-03-25 DIAGNOSIS — Z7952 Long term (current) use of systemic steroids: Secondary | ICD-10-CM | POA: Diagnosis not present

## 2022-03-25 DIAGNOSIS — E8770 Fluid overload, unspecified: Secondary | ICD-10-CM | POA: Diagnosis not present

## 2022-03-25 DIAGNOSIS — E1165 Type 2 diabetes mellitus with hyperglycemia: Secondary | ICD-10-CM | POA: Diagnosis not present

## 2022-03-25 DIAGNOSIS — D8989 Other specified disorders involving the immune mechanism, not elsewhere classified: Secondary | ICD-10-CM | POA: Diagnosis not present

## 2022-03-25 DIAGNOSIS — Z862 Personal history of diseases of the blood and blood-forming organs and certain disorders involving the immune mechanism: Secondary | ICD-10-CM | POA: Diagnosis not present

## 2022-03-25 DIAGNOSIS — Z4822 Encounter for aftercare following kidney transplant: Secondary | ICD-10-CM | POA: Diagnosis not present

## 2022-03-25 DIAGNOSIS — Z940 Kidney transplant status: Secondary | ICD-10-CM | POA: Diagnosis not present

## 2022-03-25 DIAGNOSIS — Z79621 Long term (current) use of calcineurin inhibitor: Secondary | ICD-10-CM | POA: Diagnosis not present

## 2022-03-25 DIAGNOSIS — Z8639 Personal history of other endocrine, nutritional and metabolic disease: Secondary | ICD-10-CM | POA: Diagnosis not present

## 2022-03-28 DIAGNOSIS — T8029XA Infection following other infusion, transfusion and therapeutic injection, initial encounter: Secondary | ICD-10-CM | POA: Diagnosis not present

## 2022-03-28 DIAGNOSIS — S41101D Unspecified open wound of right upper arm, subsequent encounter: Secondary | ICD-10-CM | POA: Diagnosis not present

## 2022-03-28 DIAGNOSIS — Z79621 Long term (current) use of calcineurin inhibitor: Secondary | ICD-10-CM | POA: Diagnosis not present

## 2022-03-28 DIAGNOSIS — Z940 Kidney transplant status: Secondary | ICD-10-CM | POA: Diagnosis not present

## 2022-03-28 DIAGNOSIS — K219 Gastro-esophageal reflux disease without esophagitis: Secondary | ICD-10-CM | POA: Diagnosis not present

## 2022-03-28 DIAGNOSIS — E785 Hyperlipidemia, unspecified: Secondary | ICD-10-CM | POA: Diagnosis not present

## 2022-03-28 DIAGNOSIS — K56600 Partial intestinal obstruction, unspecified as to cause: Secondary | ICD-10-CM | POA: Diagnosis not present

## 2022-03-28 DIAGNOSIS — D84821 Immunodeficiency due to drugs: Secondary | ICD-10-CM | POA: Diagnosis not present

## 2022-03-28 DIAGNOSIS — I5032 Chronic diastolic (congestive) heart failure: Secondary | ICD-10-CM | POA: Diagnosis not present

## 2022-03-28 DIAGNOSIS — D649 Anemia, unspecified: Secondary | ICD-10-CM | POA: Diagnosis not present

## 2022-03-28 DIAGNOSIS — Z794 Long term (current) use of insulin: Secondary | ICD-10-CM | POA: Diagnosis not present

## 2022-03-28 DIAGNOSIS — Z7952 Long term (current) use of systemic steroids: Secondary | ICD-10-CM | POA: Diagnosis not present

## 2022-03-28 DIAGNOSIS — L0889 Other specified local infections of the skin and subcutaneous tissue: Secondary | ICD-10-CM | POA: Diagnosis not present

## 2022-03-28 DIAGNOSIS — J449 Chronic obstructive pulmonary disease, unspecified: Secondary | ICD-10-CM | POA: Diagnosis not present

## 2022-03-28 DIAGNOSIS — T8089XA Other complications following infusion, transfusion and therapeutic injection, initial encounter: Secondary | ICD-10-CM | POA: Diagnosis not present

## 2022-03-28 DIAGNOSIS — Z7969 Long term (current) use of other immunomodulators and immunosuppressants: Secondary | ICD-10-CM | POA: Diagnosis not present

## 2022-03-28 DIAGNOSIS — R140 Abdominal distension (gaseous): Secondary | ICD-10-CM | POA: Diagnosis not present

## 2022-03-28 DIAGNOSIS — Z452 Encounter for adjustment and management of vascular access device: Secondary | ICD-10-CM | POA: Diagnosis not present

## 2022-03-28 DIAGNOSIS — D509 Iron deficiency anemia, unspecified: Secondary | ICD-10-CM | POA: Diagnosis not present

## 2022-03-28 DIAGNOSIS — R112 Nausea with vomiting, unspecified: Secondary | ICD-10-CM | POA: Diagnosis not present

## 2022-03-28 DIAGNOSIS — E119 Type 2 diabetes mellitus without complications: Secondary | ICD-10-CM | POA: Diagnosis not present

## 2022-03-28 DIAGNOSIS — I82611 Acute embolism and thrombosis of superficial veins of right upper extremity: Secondary | ICD-10-CM | POA: Diagnosis not present

## 2022-03-28 DIAGNOSIS — I10 Essential (primary) hypertension: Secondary | ICD-10-CM | POA: Diagnosis not present

## 2022-03-28 DIAGNOSIS — I110 Hypertensive heart disease with heart failure: Secondary | ICD-10-CM | POA: Diagnosis not present

## 2022-03-28 DIAGNOSIS — J4520 Mild intermittent asthma, uncomplicated: Secondary | ICD-10-CM | POA: Diagnosis not present

## 2022-03-28 DIAGNOSIS — Z4659 Encounter for fitting and adjustment of other gastrointestinal appliance and device: Secondary | ICD-10-CM | POA: Diagnosis not present

## 2022-03-28 DIAGNOSIS — K581 Irritable bowel syndrome with constipation: Secondary | ICD-10-CM | POA: Diagnosis not present

## 2022-03-28 DIAGNOSIS — E039 Hypothyroidism, unspecified: Secondary | ICD-10-CM | POA: Diagnosis not present

## 2022-03-28 DIAGNOSIS — Z7982 Long term (current) use of aspirin: Secondary | ICD-10-CM | POA: Diagnosis not present

## 2022-03-28 DIAGNOSIS — D849 Immunodeficiency, unspecified: Secondary | ICD-10-CM | POA: Diagnosis not present

## 2022-03-28 DIAGNOSIS — E1142 Type 2 diabetes mellitus with diabetic polyneuropathy: Secondary | ICD-10-CM | POA: Diagnosis not present

## 2022-03-28 DIAGNOSIS — L304 Erythema intertrigo: Secondary | ICD-10-CM | POA: Diagnosis not present

## 2022-03-28 DIAGNOSIS — Z20822 Contact with and (suspected) exposure to covid-19: Secondary | ICD-10-CM | POA: Diagnosis not present

## 2022-03-28 DIAGNOSIS — E049 Nontoxic goiter, unspecified: Secondary | ICD-10-CM | POA: Diagnosis not present

## 2022-03-28 DIAGNOSIS — T8149XA Infection following a procedure, other surgical site, initial encounter: Secondary | ICD-10-CM | POA: Diagnosis not present

## 2022-03-28 DIAGNOSIS — Z5181 Encounter for therapeutic drug level monitoring: Secondary | ICD-10-CM | POA: Diagnosis not present

## 2022-03-28 DIAGNOSIS — Z7951 Long term (current) use of inhaled steroids: Secondary | ICD-10-CM | POA: Diagnosis not present

## 2022-03-28 DIAGNOSIS — Z720 Tobacco use: Secondary | ICD-10-CM | POA: Diagnosis not present

## 2022-03-28 DIAGNOSIS — D72829 Elevated white blood cell count, unspecified: Secondary | ICD-10-CM | POA: Diagnosis not present

## 2022-03-28 DIAGNOSIS — K56609 Unspecified intestinal obstruction, unspecified as to partial versus complete obstruction: Secondary | ICD-10-CM | POA: Diagnosis not present

## 2022-03-28 DIAGNOSIS — Z4822 Encounter for aftercare following kidney transplant: Secondary | ICD-10-CM | POA: Diagnosis not present

## 2022-03-29 DIAGNOSIS — Z5181 Encounter for therapeutic drug level monitoring: Secondary | ICD-10-CM | POA: Diagnosis not present

## 2022-03-29 DIAGNOSIS — E039 Hypothyroidism, unspecified: Secondary | ICD-10-CM | POA: Diagnosis not present

## 2022-03-29 DIAGNOSIS — Z452 Encounter for adjustment and management of vascular access device: Secondary | ICD-10-CM | POA: Diagnosis not present

## 2022-03-29 DIAGNOSIS — Z4659 Encounter for fitting and adjustment of other gastrointestinal appliance and device: Secondary | ICD-10-CM | POA: Diagnosis not present

## 2022-03-29 DIAGNOSIS — Z4822 Encounter for aftercare following kidney transplant: Secondary | ICD-10-CM | POA: Diagnosis not present

## 2022-03-29 DIAGNOSIS — E119 Type 2 diabetes mellitus without complications: Secondary | ICD-10-CM | POA: Diagnosis not present

## 2022-03-29 DIAGNOSIS — Z940 Kidney transplant status: Secondary | ICD-10-CM | POA: Diagnosis not present

## 2022-03-29 DIAGNOSIS — K56600 Partial intestinal obstruction, unspecified as to cause: Secondary | ICD-10-CM | POA: Diagnosis not present

## 2022-03-30 DIAGNOSIS — E119 Type 2 diabetes mellitus without complications: Secondary | ICD-10-CM | POA: Diagnosis not present

## 2022-03-30 DIAGNOSIS — Z940 Kidney transplant status: Secondary | ICD-10-CM | POA: Diagnosis not present

## 2022-03-30 DIAGNOSIS — D84821 Immunodeficiency due to drugs: Secondary | ICD-10-CM | POA: Diagnosis not present

## 2022-03-30 DIAGNOSIS — I10 Essential (primary) hypertension: Secondary | ICD-10-CM | POA: Diagnosis not present

## 2022-03-30 DIAGNOSIS — Z79621 Long term (current) use of calcineurin inhibitor: Secondary | ICD-10-CM | POA: Diagnosis not present

## 2022-03-30 DIAGNOSIS — Z5181 Encounter for therapeutic drug level monitoring: Secondary | ICD-10-CM | POA: Diagnosis not present

## 2022-03-30 DIAGNOSIS — K56600 Partial intestinal obstruction, unspecified as to cause: Secondary | ICD-10-CM | POA: Diagnosis not present

## 2022-03-30 DIAGNOSIS — Z4822 Encounter for aftercare following kidney transplant: Secondary | ICD-10-CM | POA: Diagnosis not present

## 2022-03-30 DIAGNOSIS — R112 Nausea with vomiting, unspecified: Secondary | ICD-10-CM | POA: Diagnosis not present

## 2022-03-31 DIAGNOSIS — Z79621 Long term (current) use of calcineurin inhibitor: Secondary | ICD-10-CM | POA: Diagnosis not present

## 2022-03-31 DIAGNOSIS — Z4822 Encounter for aftercare following kidney transplant: Secondary | ICD-10-CM | POA: Diagnosis not present

## 2022-03-31 DIAGNOSIS — E119 Type 2 diabetes mellitus without complications: Secondary | ICD-10-CM | POA: Diagnosis not present

## 2022-03-31 DIAGNOSIS — Z940 Kidney transplant status: Secondary | ICD-10-CM | POA: Diagnosis not present

## 2022-03-31 DIAGNOSIS — R112 Nausea with vomiting, unspecified: Secondary | ICD-10-CM | POA: Diagnosis not present

## 2022-03-31 DIAGNOSIS — K56600 Partial intestinal obstruction, unspecified as to cause: Secondary | ICD-10-CM | POA: Diagnosis not present

## 2022-03-31 DIAGNOSIS — I10 Essential (primary) hypertension: Secondary | ICD-10-CM | POA: Diagnosis not present

## 2022-03-31 DIAGNOSIS — Z5181 Encounter for therapeutic drug level monitoring: Secondary | ICD-10-CM | POA: Diagnosis not present

## 2022-03-31 DIAGNOSIS — D84821 Immunodeficiency due to drugs: Secondary | ICD-10-CM | POA: Diagnosis not present

## 2022-04-01 DIAGNOSIS — Z940 Kidney transplant status: Secondary | ICD-10-CM | POA: Diagnosis not present

## 2022-04-01 DIAGNOSIS — Z7952 Long term (current) use of systemic steroids: Secondary | ICD-10-CM | POA: Diagnosis not present

## 2022-04-01 DIAGNOSIS — Z79899 Other long term (current) drug therapy: Secondary | ICD-10-CM | POA: Diagnosis not present

## 2022-04-01 DIAGNOSIS — Z794 Long term (current) use of insulin: Secondary | ICD-10-CM | POA: Diagnosis not present

## 2022-04-01 DIAGNOSIS — D8989 Other specified disorders involving the immune mechanism, not elsewhere classified: Secondary | ICD-10-CM | POA: Diagnosis not present

## 2022-04-01 DIAGNOSIS — S41101D Unspecified open wound of right upper arm, subsequent encounter: Secondary | ICD-10-CM | POA: Diagnosis not present

## 2022-04-01 DIAGNOSIS — E039 Hypothyroidism, unspecified: Secondary | ICD-10-CM | POA: Diagnosis not present

## 2022-04-01 DIAGNOSIS — I10 Essential (primary) hypertension: Secondary | ICD-10-CM | POA: Diagnosis not present

## 2022-04-01 DIAGNOSIS — E785 Hyperlipidemia, unspecified: Secondary | ICD-10-CM | POA: Diagnosis not present

## 2022-04-01 DIAGNOSIS — D649 Anemia, unspecified: Secondary | ICD-10-CM | POA: Diagnosis not present

## 2022-04-01 DIAGNOSIS — Z881 Allergy status to other antibiotic agents status: Secondary | ICD-10-CM | POA: Diagnosis not present

## 2022-04-01 DIAGNOSIS — Z4822 Encounter for aftercare following kidney transplant: Secondary | ICD-10-CM | POA: Diagnosis not present

## 2022-04-01 DIAGNOSIS — L304 Erythema intertrigo: Secondary | ICD-10-CM | POA: Diagnosis not present

## 2022-04-01 DIAGNOSIS — Z79620 Long term (current) use of immunosuppressive biologic: Secondary | ICD-10-CM | POA: Diagnosis not present

## 2022-04-01 DIAGNOSIS — E119 Type 2 diabetes mellitus without complications: Secondary | ICD-10-CM | POA: Diagnosis not present

## 2022-04-05 DIAGNOSIS — E118 Type 2 diabetes mellitus with unspecified complications: Secondary | ICD-10-CM | POA: Diagnosis not present

## 2022-04-05 DIAGNOSIS — S41101A Unspecified open wound of right upper arm, initial encounter: Secondary | ICD-10-CM | POA: Diagnosis not present

## 2022-04-05 DIAGNOSIS — Z794 Long term (current) use of insulin: Secondary | ICD-10-CM | POA: Diagnosis not present

## 2022-04-05 DIAGNOSIS — Z940 Kidney transplant status: Secondary | ICD-10-CM | POA: Diagnosis not present

## 2022-04-05 DIAGNOSIS — L98495 Non-pressure chronic ulcer of skin of other sites with muscle involvement without evidence of necrosis: Secondary | ICD-10-CM | POA: Diagnosis not present

## 2022-04-05 DIAGNOSIS — S51002A Unspecified open wound of left elbow, initial encounter: Secondary | ICD-10-CM | POA: Diagnosis not present

## 2022-04-05 DIAGNOSIS — Z466 Encounter for fitting and adjustment of urinary device: Secondary | ICD-10-CM | POA: Diagnosis not present

## 2022-04-05 DIAGNOSIS — T8189XA Other complications of procedures, not elsewhere classified, initial encounter: Secondary | ICD-10-CM | POA: Diagnosis not present

## 2022-04-06 DIAGNOSIS — Z794 Long term (current) use of insulin: Secondary | ICD-10-CM | POA: Diagnosis not present

## 2022-04-06 DIAGNOSIS — E118 Type 2 diabetes mellitus with unspecified complications: Secondary | ICD-10-CM | POA: Diagnosis not present

## 2022-04-06 DIAGNOSIS — Z940 Kidney transplant status: Secondary | ICD-10-CM | POA: Diagnosis not present

## 2022-04-06 DIAGNOSIS — S51002A Unspecified open wound of left elbow, initial encounter: Secondary | ICD-10-CM | POA: Diagnosis not present

## 2022-04-06 DIAGNOSIS — Z466 Encounter for fitting and adjustment of urinary device: Secondary | ICD-10-CM | POA: Diagnosis not present

## 2022-04-08 DIAGNOSIS — Z5181 Encounter for therapeutic drug level monitoring: Secondary | ICD-10-CM | POA: Diagnosis not present

## 2022-04-08 DIAGNOSIS — R7989 Other specified abnormal findings of blood chemistry: Secondary | ICD-10-CM | POA: Diagnosis not present

## 2022-04-08 DIAGNOSIS — E119 Type 2 diabetes mellitus without complications: Secondary | ICD-10-CM | POA: Diagnosis not present

## 2022-04-08 DIAGNOSIS — Z4822 Encounter for aftercare following kidney transplant: Secondary | ICD-10-CM | POA: Diagnosis not present

## 2022-04-08 DIAGNOSIS — I10 Essential (primary) hypertension: Secondary | ICD-10-CM | POA: Diagnosis not present

## 2022-04-08 DIAGNOSIS — Z940 Kidney transplant status: Secondary | ICD-10-CM | POA: Diagnosis not present

## 2022-04-08 DIAGNOSIS — D849 Immunodeficiency, unspecified: Secondary | ICD-10-CM | POA: Diagnosis not present

## 2022-04-08 DIAGNOSIS — T8089XA Other complications following infusion, transfusion and therapeutic injection, initial encounter: Secondary | ICD-10-CM | POA: Diagnosis not present

## 2022-04-08 DIAGNOSIS — S51001A Unspecified open wound of right elbow, initial encounter: Secondary | ICD-10-CM | POA: Diagnosis not present

## 2022-04-08 DIAGNOSIS — R82998 Other abnormal findings in urine: Secondary | ICD-10-CM | POA: Diagnosis not present

## 2022-04-08 DIAGNOSIS — S41101D Unspecified open wound of right upper arm, subsequent encounter: Secondary | ICD-10-CM | POA: Diagnosis not present

## 2022-04-08 DIAGNOSIS — R791 Abnormal coagulation profile: Secondary | ICD-10-CM | POA: Diagnosis not present

## 2022-04-11 DIAGNOSIS — S41101D Unspecified open wound of right upper arm, subsequent encounter: Secondary | ICD-10-CM | POA: Diagnosis not present

## 2022-04-13 DIAGNOSIS — S41101D Unspecified open wound of right upper arm, subsequent encounter: Secondary | ICD-10-CM | POA: Diagnosis not present

## 2022-04-14 DIAGNOSIS — D849 Immunodeficiency, unspecified: Secondary | ICD-10-CM | POA: Diagnosis not present

## 2022-04-14 DIAGNOSIS — S41101D Unspecified open wound of right upper arm, subsequent encounter: Secondary | ICD-10-CM | POA: Diagnosis not present

## 2022-04-14 DIAGNOSIS — Z4822 Encounter for aftercare following kidney transplant: Secondary | ICD-10-CM | POA: Diagnosis not present

## 2022-04-14 DIAGNOSIS — Z79621 Long term (current) use of calcineurin inhibitor: Secondary | ICD-10-CM | POA: Diagnosis not present

## 2022-04-14 DIAGNOSIS — Z7952 Long term (current) use of systemic steroids: Secondary | ICD-10-CM | POA: Diagnosis not present

## 2022-04-14 DIAGNOSIS — Z5181 Encounter for therapeutic drug level monitoring: Secondary | ICD-10-CM | POA: Diagnosis not present

## 2022-04-14 DIAGNOSIS — Z794 Long term (current) use of insulin: Secondary | ICD-10-CM | POA: Diagnosis not present

## 2022-04-14 DIAGNOSIS — Z940 Kidney transplant status: Secondary | ICD-10-CM | POA: Diagnosis not present

## 2022-04-14 DIAGNOSIS — Z79899 Other long term (current) drug therapy: Secondary | ICD-10-CM | POA: Diagnosis not present

## 2022-04-14 DIAGNOSIS — E785 Hyperlipidemia, unspecified: Secondary | ICD-10-CM | POA: Diagnosis not present

## 2022-04-14 DIAGNOSIS — D649 Anemia, unspecified: Secondary | ICD-10-CM | POA: Diagnosis not present

## 2022-04-14 DIAGNOSIS — E1165 Type 2 diabetes mellitus with hyperglycemia: Secondary | ICD-10-CM | POA: Diagnosis not present

## 2022-04-14 DIAGNOSIS — I82611 Acute embolism and thrombosis of superficial veins of right upper extremity: Secondary | ICD-10-CM | POA: Diagnosis not present

## 2022-04-14 DIAGNOSIS — Z79624 Long term (current) use of inhibitors of nucleotide synthesis: Secondary | ICD-10-CM | POA: Diagnosis not present

## 2022-04-14 DIAGNOSIS — I10 Essential (primary) hypertension: Secondary | ICD-10-CM | POA: Diagnosis not present

## 2022-04-14 DIAGNOSIS — E119 Type 2 diabetes mellitus without complications: Secondary | ICD-10-CM | POA: Diagnosis not present

## 2022-04-14 DIAGNOSIS — E039 Hypothyroidism, unspecified: Secondary | ICD-10-CM | POA: Diagnosis not present

## 2022-04-14 DIAGNOSIS — Z792 Long term (current) use of antibiotics: Secondary | ICD-10-CM | POA: Diagnosis not present

## 2022-04-18 DIAGNOSIS — S41101D Unspecified open wound of right upper arm, subsequent encounter: Secondary | ICD-10-CM | POA: Diagnosis not present

## 2022-04-20 DIAGNOSIS — J4520 Mild intermittent asthma, uncomplicated: Secondary | ICD-10-CM | POA: Diagnosis not present

## 2022-04-20 DIAGNOSIS — Z79899 Other long term (current) drug therapy: Secondary | ICD-10-CM | POA: Diagnosis not present

## 2022-04-20 DIAGNOSIS — D649 Anemia, unspecified: Secondary | ICD-10-CM | POA: Diagnosis not present

## 2022-04-20 DIAGNOSIS — K589 Irritable bowel syndrome without diarrhea: Secondary | ICD-10-CM | POA: Diagnosis not present

## 2022-04-20 DIAGNOSIS — I110 Hypertensive heart disease with heart failure: Secondary | ICD-10-CM | POA: Diagnosis not present

## 2022-04-20 DIAGNOSIS — E1165 Type 2 diabetes mellitus with hyperglycemia: Secondary | ICD-10-CM | POA: Diagnosis not present

## 2022-04-20 DIAGNOSIS — Z87891 Personal history of nicotine dependence: Secondary | ICD-10-CM | POA: Diagnosis not present

## 2022-04-20 DIAGNOSIS — Z794 Long term (current) use of insulin: Secondary | ICD-10-CM | POA: Diagnosis not present

## 2022-04-20 DIAGNOSIS — E039 Hypothyroidism, unspecified: Secondary | ICD-10-CM | POA: Diagnosis not present

## 2022-04-20 DIAGNOSIS — S51001D Unspecified open wound of right elbow, subsequent encounter: Secondary | ICD-10-CM | POA: Diagnosis not present

## 2022-04-20 DIAGNOSIS — D849 Immunodeficiency, unspecified: Secondary | ICD-10-CM | POA: Diagnosis not present

## 2022-04-20 DIAGNOSIS — Z79624 Long term (current) use of inhibitors of nucleotide synthesis: Secondary | ICD-10-CM | POA: Diagnosis not present

## 2022-04-20 DIAGNOSIS — D631 Anemia in chronic kidney disease: Secondary | ICD-10-CM | POA: Diagnosis not present

## 2022-04-20 DIAGNOSIS — Z9889 Other specified postprocedural states: Secondary | ICD-10-CM | POA: Diagnosis not present

## 2022-04-20 DIAGNOSIS — N184 Chronic kidney disease, stage 4 (severe): Secondary | ICD-10-CM | POA: Diagnosis not present

## 2022-04-20 DIAGNOSIS — I5032 Chronic diastolic (congestive) heart failure: Secondary | ICD-10-CM | POA: Diagnosis not present

## 2022-04-20 DIAGNOSIS — Z4822 Encounter for aftercare following kidney transplant: Secondary | ICD-10-CM | POA: Diagnosis not present

## 2022-04-20 DIAGNOSIS — E785 Hyperlipidemia, unspecified: Secondary | ICD-10-CM | POA: Diagnosis not present

## 2022-04-20 DIAGNOSIS — I10 Essential (primary) hypertension: Secondary | ICD-10-CM | POA: Diagnosis not present

## 2022-04-20 DIAGNOSIS — Z79621 Long term (current) use of calcineurin inhibitor: Secondary | ICD-10-CM | POA: Diagnosis not present

## 2022-04-20 DIAGNOSIS — Z7952 Long term (current) use of systemic steroids: Secondary | ICD-10-CM | POA: Diagnosis not present

## 2022-04-21 DIAGNOSIS — I129 Hypertensive chronic kidney disease with stage 1 through stage 4 chronic kidney disease, or unspecified chronic kidney disease: Secondary | ICD-10-CM | POA: Diagnosis not present

## 2022-04-21 DIAGNOSIS — J441 Chronic obstructive pulmonary disease with (acute) exacerbation: Secondary | ICD-10-CM | POA: Diagnosis not present

## 2022-04-21 DIAGNOSIS — E1122 Type 2 diabetes mellitus with diabetic chronic kidney disease: Secondary | ICD-10-CM | POA: Diagnosis not present

## 2022-04-21 DIAGNOSIS — N183 Chronic kidney disease, stage 3 unspecified: Secondary | ICD-10-CM | POA: Diagnosis not present

## 2022-04-27 DIAGNOSIS — S41101D Unspecified open wound of right upper arm, subsequent encounter: Secondary | ICD-10-CM | POA: Diagnosis not present

## 2022-04-28 DIAGNOSIS — D849 Immunodeficiency, unspecified: Secondary | ICD-10-CM | POA: Diagnosis not present

## 2022-04-28 DIAGNOSIS — E872 Acidosis, unspecified: Secondary | ICD-10-CM | POA: Diagnosis not present

## 2022-04-28 DIAGNOSIS — Z5181 Encounter for therapeutic drug level monitoring: Secondary | ICD-10-CM | POA: Diagnosis not present

## 2022-04-28 DIAGNOSIS — Z79621 Long term (current) use of calcineurin inhibitor: Secondary | ICD-10-CM | POA: Diagnosis not present

## 2022-04-28 DIAGNOSIS — Z79899 Other long term (current) drug therapy: Secondary | ICD-10-CM | POA: Diagnosis not present

## 2022-04-28 DIAGNOSIS — Z4822 Encounter for aftercare following kidney transplant: Secondary | ICD-10-CM | POA: Diagnosis not present

## 2022-04-28 DIAGNOSIS — D649 Anemia, unspecified: Secondary | ICD-10-CM | POA: Diagnosis not present

## 2022-04-28 DIAGNOSIS — E119 Type 2 diabetes mellitus without complications: Secondary | ICD-10-CM | POA: Diagnosis not present

## 2022-04-28 DIAGNOSIS — Z940 Kidney transplant status: Secondary | ICD-10-CM | POA: Diagnosis not present

## 2022-04-29 DIAGNOSIS — S41101D Unspecified open wound of right upper arm, subsequent encounter: Secondary | ICD-10-CM | POA: Diagnosis not present

## 2022-04-29 DIAGNOSIS — S51001D Unspecified open wound of right elbow, subsequent encounter: Secondary | ICD-10-CM | POA: Diagnosis not present

## 2022-05-05 DIAGNOSIS — E039 Hypothyroidism, unspecified: Secondary | ICD-10-CM | POA: Diagnosis not present

## 2022-05-05 DIAGNOSIS — E872 Acidosis, unspecified: Secondary | ICD-10-CM | POA: Diagnosis not present

## 2022-05-05 DIAGNOSIS — E785 Hyperlipidemia, unspecified: Secondary | ICD-10-CM | POA: Diagnosis not present

## 2022-05-05 DIAGNOSIS — Z794 Long term (current) use of insulin: Secondary | ICD-10-CM | POA: Diagnosis not present

## 2022-05-05 DIAGNOSIS — Z4822 Encounter for aftercare following kidney transplant: Secondary | ICD-10-CM | POA: Diagnosis not present

## 2022-05-05 DIAGNOSIS — L03113 Cellulitis of right upper limb: Secondary | ICD-10-CM | POA: Diagnosis not present

## 2022-05-05 DIAGNOSIS — Z79899 Other long term (current) drug therapy: Secondary | ICD-10-CM | POA: Diagnosis not present

## 2022-05-05 DIAGNOSIS — D649 Anemia, unspecified: Secondary | ICD-10-CM | POA: Diagnosis not present

## 2022-05-05 DIAGNOSIS — Z79621 Long term (current) use of calcineurin inhibitor: Secondary | ICD-10-CM | POA: Diagnosis not present

## 2022-05-05 DIAGNOSIS — D849 Immunodeficiency, unspecified: Secondary | ICD-10-CM | POA: Diagnosis not present

## 2022-05-05 DIAGNOSIS — E119 Type 2 diabetes mellitus without complications: Secondary | ICD-10-CM | POA: Diagnosis not present

## 2022-05-05 DIAGNOSIS — S41101S Unspecified open wound of right upper arm, sequela: Secondary | ICD-10-CM | POA: Diagnosis not present

## 2022-05-05 DIAGNOSIS — Z792 Long term (current) use of antibiotics: Secondary | ICD-10-CM | POA: Diagnosis not present

## 2022-05-05 DIAGNOSIS — Z5181 Encounter for therapeutic drug level monitoring: Secondary | ICD-10-CM | POA: Diagnosis not present

## 2022-05-05 DIAGNOSIS — I10 Essential (primary) hypertension: Secondary | ICD-10-CM | POA: Diagnosis not present

## 2022-05-05 DIAGNOSIS — Z940 Kidney transplant status: Secondary | ICD-10-CM | POA: Diagnosis not present

## 2022-05-05 DIAGNOSIS — Z978 Presence of other specified devices: Secondary | ICD-10-CM | POA: Diagnosis not present

## 2022-05-05 DIAGNOSIS — Z79624 Long term (current) use of inhibitors of nucleotide synthesis: Secondary | ICD-10-CM | POA: Diagnosis not present

## 2022-05-05 DIAGNOSIS — Z7952 Long term (current) use of systemic steroids: Secondary | ICD-10-CM | POA: Diagnosis not present

## 2022-05-10 DIAGNOSIS — E119 Type 2 diabetes mellitus without complications: Secondary | ICD-10-CM | POA: Diagnosis not present

## 2022-05-10 DIAGNOSIS — Z87891 Personal history of nicotine dependence: Secondary | ICD-10-CM | POA: Diagnosis not present

## 2022-05-10 DIAGNOSIS — Z01812 Encounter for preprocedural laboratory examination: Secondary | ICD-10-CM | POA: Diagnosis not present

## 2022-05-10 DIAGNOSIS — S41101D Unspecified open wound of right upper arm, subsequent encounter: Secondary | ICD-10-CM | POA: Diagnosis not present

## 2022-05-13 DIAGNOSIS — I82611 Acute embolism and thrombosis of superficial veins of right upper extremity: Secondary | ICD-10-CM | POA: Diagnosis not present

## 2022-05-13 DIAGNOSIS — E039 Hypothyroidism, unspecified: Secondary | ICD-10-CM | POA: Diagnosis not present

## 2022-05-13 DIAGNOSIS — Z940 Kidney transplant status: Secondary | ICD-10-CM | POA: Diagnosis not present

## 2022-05-13 DIAGNOSIS — S41101D Unspecified open wound of right upper arm, subsequent encounter: Secondary | ICD-10-CM | POA: Diagnosis not present

## 2022-05-13 DIAGNOSIS — Z7952 Long term (current) use of systemic steroids: Secondary | ICD-10-CM | POA: Diagnosis not present

## 2022-05-13 DIAGNOSIS — D849 Immunodeficiency, unspecified: Secondary | ICD-10-CM | POA: Diagnosis not present

## 2022-05-13 DIAGNOSIS — E119 Type 2 diabetes mellitus without complications: Secondary | ICD-10-CM | POA: Diagnosis not present

## 2022-05-13 DIAGNOSIS — Z4822 Encounter for aftercare following kidney transplant: Secondary | ICD-10-CM | POA: Diagnosis not present

## 2022-05-13 DIAGNOSIS — I10 Essential (primary) hypertension: Secondary | ICD-10-CM | POA: Diagnosis not present

## 2022-05-13 DIAGNOSIS — E785 Hyperlipidemia, unspecified: Secondary | ICD-10-CM | POA: Diagnosis not present

## 2022-05-13 DIAGNOSIS — D649 Anemia, unspecified: Secondary | ICD-10-CM | POA: Diagnosis not present

## 2022-05-13 DIAGNOSIS — Z79899 Other long term (current) drug therapy: Secondary | ICD-10-CM | POA: Diagnosis not present

## 2022-05-13 DIAGNOSIS — Z5181 Encounter for therapeutic drug level monitoring: Secondary | ICD-10-CM | POA: Diagnosis not present

## 2022-05-13 DIAGNOSIS — Z794 Long term (current) use of insulin: Secondary | ICD-10-CM | POA: Diagnosis not present

## 2022-05-13 DIAGNOSIS — Z79624 Long term (current) use of inhibitors of nucleotide synthesis: Secondary | ICD-10-CM | POA: Diagnosis not present

## 2022-05-13 DIAGNOSIS — L03113 Cellulitis of right upper limb: Secondary | ICD-10-CM | POA: Diagnosis not present

## 2022-05-13 DIAGNOSIS — E872 Acidosis, unspecified: Secondary | ICD-10-CM | POA: Diagnosis not present

## 2022-05-13 DIAGNOSIS — Z792 Long term (current) use of antibiotics: Secondary | ICD-10-CM | POA: Diagnosis not present

## 2022-05-13 DIAGNOSIS — Z79621 Long term (current) use of calcineurin inhibitor: Secondary | ICD-10-CM | POA: Diagnosis not present

## 2022-05-18 DIAGNOSIS — D649 Anemia, unspecified: Secondary | ICD-10-CM | POA: Diagnosis not present

## 2022-05-18 DIAGNOSIS — S41101D Unspecified open wound of right upper arm, subsequent encounter: Secondary | ICD-10-CM | POA: Diagnosis not present

## 2022-05-18 DIAGNOSIS — Z7952 Long term (current) use of systemic steroids: Secondary | ICD-10-CM | POA: Diagnosis not present

## 2022-05-18 DIAGNOSIS — I10 Essential (primary) hypertension: Secondary | ICD-10-CM | POA: Diagnosis not present

## 2022-05-18 DIAGNOSIS — Z881 Allergy status to other antibiotic agents status: Secondary | ICD-10-CM | POA: Diagnosis not present

## 2022-05-18 DIAGNOSIS — I5032 Chronic diastolic (congestive) heart failure: Secondary | ICD-10-CM | POA: Diagnosis not present

## 2022-05-18 DIAGNOSIS — Z4822 Encounter for aftercare following kidney transplant: Secondary | ICD-10-CM | POA: Diagnosis not present

## 2022-05-18 DIAGNOSIS — Z940 Kidney transplant status: Secondary | ICD-10-CM | POA: Diagnosis not present

## 2022-05-18 DIAGNOSIS — Z79899 Other long term (current) drug therapy: Secondary | ICD-10-CM | POA: Diagnosis not present

## 2022-05-18 DIAGNOSIS — E785 Hyperlipidemia, unspecified: Secondary | ICD-10-CM | POA: Diagnosis not present

## 2022-05-18 DIAGNOSIS — Z79621 Long term (current) use of calcineurin inhibitor: Secondary | ICD-10-CM | POA: Diagnosis not present

## 2022-05-18 DIAGNOSIS — I110 Hypertensive heart disease with heart failure: Secondary | ICD-10-CM | POA: Diagnosis not present

## 2022-05-18 DIAGNOSIS — Z888 Allergy status to other drugs, medicaments and biological substances status: Secondary | ICD-10-CM | POA: Diagnosis not present

## 2022-05-18 DIAGNOSIS — E119 Type 2 diabetes mellitus without complications: Secondary | ICD-10-CM | POA: Diagnosis not present

## 2022-05-18 DIAGNOSIS — Z882 Allergy status to sulfonamides status: Secondary | ICD-10-CM | POA: Diagnosis not present

## 2022-05-18 DIAGNOSIS — E872 Acidosis, unspecified: Secondary | ICD-10-CM | POA: Diagnosis not present

## 2022-05-18 DIAGNOSIS — D849 Immunodeficiency, unspecified: Secondary | ICD-10-CM | POA: Diagnosis not present

## 2022-05-18 DIAGNOSIS — Z794 Long term (current) use of insulin: Secondary | ICD-10-CM | POA: Diagnosis not present

## 2022-05-18 DIAGNOSIS — Z5181 Encounter for therapeutic drug level monitoring: Secondary | ICD-10-CM | POA: Diagnosis not present

## 2022-05-18 DIAGNOSIS — E039 Hypothyroidism, unspecified: Secondary | ICD-10-CM | POA: Diagnosis not present

## 2022-05-18 DIAGNOSIS — Z91040 Latex allergy status: Secondary | ICD-10-CM | POA: Diagnosis not present

## 2022-05-18 DIAGNOSIS — Z79624 Long term (current) use of inhibitors of nucleotide synthesis: Secondary | ICD-10-CM | POA: Diagnosis not present

## 2022-05-19 DIAGNOSIS — S51001A Unspecified open wound of right elbow, initial encounter: Secondary | ICD-10-CM | POA: Diagnosis not present

## 2022-05-20 DIAGNOSIS — I10 Essential (primary) hypertension: Secondary | ICD-10-CM | POA: Diagnosis not present

## 2022-05-23 DIAGNOSIS — I10 Essential (primary) hypertension: Secondary | ICD-10-CM | POA: Diagnosis not present

## 2022-05-23 DIAGNOSIS — I5032 Chronic diastolic (congestive) heart failure: Secondary | ICD-10-CM | POA: Diagnosis not present

## 2022-05-23 DIAGNOSIS — N186 End stage renal disease: Secondary | ICD-10-CM | POA: Diagnosis not present

## 2022-05-23 DIAGNOSIS — Z940 Kidney transplant status: Secondary | ICD-10-CM | POA: Diagnosis not present

## 2022-05-25 DIAGNOSIS — E875 Hyperkalemia: Secondary | ICD-10-CM | POA: Diagnosis not present

## 2022-05-25 DIAGNOSIS — E119 Type 2 diabetes mellitus without complications: Secondary | ICD-10-CM | POA: Diagnosis not present

## 2022-05-25 DIAGNOSIS — D849 Immunodeficiency, unspecified: Secondary | ICD-10-CM | POA: Diagnosis not present

## 2022-05-25 DIAGNOSIS — Z79624 Long term (current) use of inhibitors of nucleotide synthesis: Secondary | ICD-10-CM | POA: Diagnosis not present

## 2022-05-25 DIAGNOSIS — Z940 Kidney transplant status: Secondary | ICD-10-CM | POA: Diagnosis not present

## 2022-05-25 DIAGNOSIS — E785 Hyperlipidemia, unspecified: Secondary | ICD-10-CM | POA: Diagnosis not present

## 2022-05-25 DIAGNOSIS — I4719 Other supraventricular tachycardia: Secondary | ICD-10-CM | POA: Diagnosis not present

## 2022-05-25 DIAGNOSIS — Z978 Presence of other specified devices: Secondary | ICD-10-CM | POA: Diagnosis not present

## 2022-05-25 DIAGNOSIS — Z7952 Long term (current) use of systemic steroids: Secondary | ICD-10-CM | POA: Diagnosis not present

## 2022-05-25 DIAGNOSIS — D649 Anemia, unspecified: Secondary | ICD-10-CM | POA: Diagnosis not present

## 2022-05-25 DIAGNOSIS — I10 Essential (primary) hypertension: Secondary | ICD-10-CM | POA: Diagnosis not present

## 2022-05-25 DIAGNOSIS — Z4822 Encounter for aftercare following kidney transplant: Secondary | ICD-10-CM | POA: Diagnosis not present

## 2022-05-25 DIAGNOSIS — Z794 Long term (current) use of insulin: Secondary | ICD-10-CM | POA: Diagnosis not present

## 2022-05-25 DIAGNOSIS — Z79621 Long term (current) use of calcineurin inhibitor: Secondary | ICD-10-CM | POA: Diagnosis not present

## 2022-05-25 DIAGNOSIS — Z5181 Encounter for therapeutic drug level monitoring: Secondary | ICD-10-CM | POA: Diagnosis not present

## 2022-05-25 DIAGNOSIS — S51801D Unspecified open wound of right forearm, subsequent encounter: Secondary | ICD-10-CM | POA: Diagnosis not present

## 2022-05-25 DIAGNOSIS — Z882 Allergy status to sulfonamides status: Secondary | ICD-10-CM | POA: Diagnosis not present

## 2022-05-25 DIAGNOSIS — E039 Hypothyroidism, unspecified: Secondary | ICD-10-CM | POA: Diagnosis not present

## 2022-05-25 DIAGNOSIS — Z79899 Other long term (current) drug therapy: Secondary | ICD-10-CM | POA: Diagnosis not present

## 2022-05-25 DIAGNOSIS — Z792 Long term (current) use of antibiotics: Secondary | ICD-10-CM | POA: Diagnosis not present

## 2022-06-01 DIAGNOSIS — Z5181 Encounter for therapeutic drug level monitoring: Secondary | ICD-10-CM | POA: Diagnosis not present

## 2022-06-01 DIAGNOSIS — Z4822 Encounter for aftercare following kidney transplant: Secondary | ICD-10-CM | POA: Diagnosis not present

## 2022-06-01 DIAGNOSIS — Z79621 Long term (current) use of calcineurin inhibitor: Secondary | ICD-10-CM | POA: Diagnosis not present

## 2022-06-03 DIAGNOSIS — G4700 Insomnia, unspecified: Secondary | ICD-10-CM | POA: Diagnosis not present

## 2022-06-03 DIAGNOSIS — I10 Essential (primary) hypertension: Secondary | ICD-10-CM | POA: Diagnosis not present

## 2022-06-03 DIAGNOSIS — Z299 Encounter for prophylactic measures, unspecified: Secondary | ICD-10-CM | POA: Diagnosis not present

## 2022-06-08 DIAGNOSIS — I10 Essential (primary) hypertension: Secondary | ICD-10-CM | POA: Diagnosis not present

## 2022-06-08 DIAGNOSIS — Z79624 Long term (current) use of inhibitors of nucleotide synthesis: Secondary | ICD-10-CM | POA: Diagnosis not present

## 2022-06-08 DIAGNOSIS — Z794 Long term (current) use of insulin: Secondary | ICD-10-CM | POA: Diagnosis not present

## 2022-06-08 DIAGNOSIS — Z4822 Encounter for aftercare following kidney transplant: Secondary | ICD-10-CM | POA: Diagnosis not present

## 2022-06-08 DIAGNOSIS — E119 Type 2 diabetes mellitus without complications: Secondary | ICD-10-CM | POA: Diagnosis not present

## 2022-06-08 DIAGNOSIS — Z7952 Long term (current) use of systemic steroids: Secondary | ICD-10-CM | POA: Diagnosis not present

## 2022-06-08 DIAGNOSIS — Z79621 Long term (current) use of calcineurin inhibitor: Secondary | ICD-10-CM | POA: Diagnosis not present

## 2022-06-08 DIAGNOSIS — Z940 Kidney transplant status: Secondary | ICD-10-CM | POA: Diagnosis not present

## 2022-06-08 DIAGNOSIS — M545 Low back pain, unspecified: Secondary | ICD-10-CM | POA: Diagnosis not present

## 2022-06-08 DIAGNOSIS — D649 Anemia, unspecified: Secondary | ICD-10-CM | POA: Diagnosis not present

## 2022-06-08 DIAGNOSIS — J4520 Mild intermittent asthma, uncomplicated: Secondary | ICD-10-CM | POA: Diagnosis not present

## 2022-06-08 DIAGNOSIS — S41101D Unspecified open wound of right upper arm, subsequent encounter: Secondary | ICD-10-CM | POA: Diagnosis not present

## 2022-06-08 DIAGNOSIS — Z5181 Encounter for therapeutic drug level monitoring: Secondary | ICD-10-CM | POA: Diagnosis not present

## 2022-06-08 DIAGNOSIS — Z7951 Long term (current) use of inhaled steroids: Secondary | ICD-10-CM | POA: Diagnosis not present

## 2022-06-08 DIAGNOSIS — Z79899 Other long term (current) drug therapy: Secondary | ICD-10-CM | POA: Diagnosis not present

## 2022-06-08 DIAGNOSIS — Z792 Long term (current) use of antibiotics: Secondary | ICD-10-CM | POA: Diagnosis not present

## 2022-06-08 DIAGNOSIS — D849 Immunodeficiency, unspecified: Secondary | ICD-10-CM | POA: Diagnosis not present

## 2022-06-08 DIAGNOSIS — E785 Hyperlipidemia, unspecified: Secondary | ICD-10-CM | POA: Diagnosis not present

## 2022-06-08 DIAGNOSIS — E039 Hypothyroidism, unspecified: Secondary | ICD-10-CM | POA: Diagnosis not present

## 2022-06-08 DIAGNOSIS — J449 Chronic obstructive pulmonary disease, unspecified: Secondary | ICD-10-CM | POA: Diagnosis not present

## 2022-06-15 DIAGNOSIS — Z5181 Encounter for therapeutic drug level monitoring: Secondary | ICD-10-CM | POA: Diagnosis not present

## 2022-06-15 DIAGNOSIS — Z79899 Other long term (current) drug therapy: Secondary | ICD-10-CM | POA: Diagnosis not present

## 2022-06-15 DIAGNOSIS — D849 Immunodeficiency, unspecified: Secondary | ICD-10-CM | POA: Diagnosis not present

## 2022-06-15 DIAGNOSIS — M545 Low back pain, unspecified: Secondary | ICD-10-CM | POA: Diagnosis not present

## 2022-06-15 DIAGNOSIS — Z79621 Long term (current) use of calcineurin inhibitor: Secondary | ICD-10-CM | POA: Diagnosis not present

## 2022-06-15 DIAGNOSIS — E119 Type 2 diabetes mellitus without complications: Secondary | ICD-10-CM | POA: Diagnosis not present

## 2022-06-15 DIAGNOSIS — E785 Hyperlipidemia, unspecified: Secondary | ICD-10-CM | POA: Diagnosis not present

## 2022-06-15 DIAGNOSIS — D649 Anemia, unspecified: Secondary | ICD-10-CM | POA: Diagnosis not present

## 2022-06-15 DIAGNOSIS — Z7952 Long term (current) use of systemic steroids: Secondary | ICD-10-CM | POA: Diagnosis not present

## 2022-06-15 DIAGNOSIS — Z79624 Long term (current) use of inhibitors of nucleotide synthesis: Secondary | ICD-10-CM | POA: Diagnosis not present

## 2022-06-15 DIAGNOSIS — E039 Hypothyroidism, unspecified: Secondary | ICD-10-CM | POA: Diagnosis not present

## 2022-06-15 DIAGNOSIS — E875 Hyperkalemia: Secondary | ICD-10-CM | POA: Diagnosis not present

## 2022-06-15 DIAGNOSIS — Z794 Long term (current) use of insulin: Secondary | ICD-10-CM | POA: Diagnosis not present

## 2022-06-15 DIAGNOSIS — Z940 Kidney transplant status: Secondary | ICD-10-CM | POA: Diagnosis not present

## 2022-06-15 DIAGNOSIS — I10 Essential (primary) hypertension: Secondary | ICD-10-CM | POA: Diagnosis not present

## 2022-06-15 DIAGNOSIS — Z4822 Encounter for aftercare following kidney transplant: Secondary | ICD-10-CM | POA: Diagnosis not present

## 2022-06-15 DIAGNOSIS — Z792 Long term (current) use of antibiotics: Secondary | ICD-10-CM | POA: Diagnosis not present

## 2022-06-15 DIAGNOSIS — J4489 Other specified chronic obstructive pulmonary disease: Secondary | ICD-10-CM | POA: Diagnosis not present

## 2022-06-20 DIAGNOSIS — I10 Essential (primary) hypertension: Secondary | ICD-10-CM | POA: Diagnosis not present

## 2022-06-20 DIAGNOSIS — Z299 Encounter for prophylactic measures, unspecified: Secondary | ICD-10-CM | POA: Diagnosis not present

## 2022-06-20 DIAGNOSIS — Z0000 Encounter for general adult medical examination without abnormal findings: Secondary | ICD-10-CM | POA: Diagnosis not present

## 2022-06-20 DIAGNOSIS — Z7189 Other specified counseling: Secondary | ICD-10-CM | POA: Diagnosis not present

## 2022-06-20 DIAGNOSIS — E11649 Type 2 diabetes mellitus with hypoglycemia without coma: Secondary | ICD-10-CM | POA: Diagnosis not present

## 2022-06-20 DIAGNOSIS — Z23 Encounter for immunization: Secondary | ICD-10-CM | POA: Diagnosis not present

## 2022-06-20 DIAGNOSIS — Z79899 Other long term (current) drug therapy: Secondary | ICD-10-CM | POA: Diagnosis not present

## 2022-06-20 DIAGNOSIS — Z940 Kidney transplant status: Secondary | ICD-10-CM | POA: Diagnosis not present

## 2022-06-27 DIAGNOSIS — E119 Type 2 diabetes mellitus without complications: Secondary | ICD-10-CM | POA: Diagnosis not present

## 2022-06-29 DIAGNOSIS — Z794 Long term (current) use of insulin: Secondary | ICD-10-CM | POA: Diagnosis not present

## 2022-06-29 DIAGNOSIS — E119 Type 2 diabetes mellitus without complications: Secondary | ICD-10-CM | POA: Diagnosis not present

## 2022-06-29 DIAGNOSIS — Z7952 Long term (current) use of systemic steroids: Secondary | ICD-10-CM | POA: Diagnosis not present

## 2022-06-29 DIAGNOSIS — I471 Supraventricular tachycardia, unspecified: Secondary | ICD-10-CM | POA: Diagnosis not present

## 2022-06-29 DIAGNOSIS — Z79621 Long term (current) use of calcineurin inhibitor: Secondary | ICD-10-CM | POA: Diagnosis not present

## 2022-06-29 DIAGNOSIS — Z792 Long term (current) use of antibiotics: Secondary | ICD-10-CM | POA: Diagnosis not present

## 2022-06-29 DIAGNOSIS — Z4822 Encounter for aftercare following kidney transplant: Secondary | ICD-10-CM | POA: Diagnosis not present

## 2022-06-29 DIAGNOSIS — M545 Low back pain, unspecified: Secondary | ICD-10-CM | POA: Diagnosis not present

## 2022-06-29 DIAGNOSIS — Z882 Allergy status to sulfonamides status: Secondary | ICD-10-CM | POA: Diagnosis not present

## 2022-06-29 DIAGNOSIS — E039 Hypothyroidism, unspecified: Secondary | ICD-10-CM | POA: Diagnosis not present

## 2022-06-29 DIAGNOSIS — Z79624 Long term (current) use of inhibitors of nucleotide synthesis: Secondary | ICD-10-CM | POA: Diagnosis not present

## 2022-06-29 DIAGNOSIS — Z79899 Other long term (current) drug therapy: Secondary | ICD-10-CM | POA: Diagnosis not present

## 2022-06-29 DIAGNOSIS — S41101D Unspecified open wound of right upper arm, subsequent encounter: Secondary | ICD-10-CM | POA: Diagnosis not present

## 2022-06-29 DIAGNOSIS — Z5181 Encounter for therapeutic drug level monitoring: Secondary | ICD-10-CM | POA: Diagnosis not present

## 2022-06-29 DIAGNOSIS — J449 Chronic obstructive pulmonary disease, unspecified: Secondary | ICD-10-CM | POA: Diagnosis not present

## 2022-06-29 DIAGNOSIS — E875 Hyperkalemia: Secondary | ICD-10-CM | POA: Diagnosis not present

## 2022-06-29 DIAGNOSIS — Z940 Kidney transplant status: Secondary | ICD-10-CM | POA: Diagnosis not present

## 2022-06-29 DIAGNOSIS — D849 Immunodeficiency, unspecified: Secondary | ICD-10-CM | POA: Diagnosis not present

## 2022-06-29 DIAGNOSIS — I10 Essential (primary) hypertension: Secondary | ICD-10-CM | POA: Diagnosis not present

## 2022-06-29 DIAGNOSIS — Z87828 Personal history of other (healed) physical injury and trauma: Secondary | ICD-10-CM | POA: Diagnosis not present

## 2022-06-29 DIAGNOSIS — E785 Hyperlipidemia, unspecified: Secondary | ICD-10-CM | POA: Diagnosis not present

## 2022-06-29 DIAGNOSIS — D649 Anemia, unspecified: Secondary | ICD-10-CM | POA: Diagnosis not present

## 2022-07-15 DIAGNOSIS — J441 Chronic obstructive pulmonary disease with (acute) exacerbation: Secondary | ICD-10-CM | POA: Diagnosis not present

## 2022-07-15 DIAGNOSIS — Z299 Encounter for prophylactic measures, unspecified: Secondary | ICD-10-CM | POA: Diagnosis not present

## 2022-07-15 DIAGNOSIS — N184 Chronic kidney disease, stage 4 (severe): Secondary | ICD-10-CM | POA: Diagnosis not present

## 2022-07-15 DIAGNOSIS — J449 Chronic obstructive pulmonary disease, unspecified: Secondary | ICD-10-CM | POA: Diagnosis not present

## 2022-07-20 DIAGNOSIS — I10 Essential (primary) hypertension: Secondary | ICD-10-CM | POA: Diagnosis not present

## 2022-07-27 DIAGNOSIS — Z7952 Long term (current) use of systemic steroids: Secondary | ICD-10-CM | POA: Diagnosis not present

## 2022-07-27 DIAGNOSIS — D849 Immunodeficiency, unspecified: Secondary | ICD-10-CM | POA: Diagnosis not present

## 2022-07-27 DIAGNOSIS — E119 Type 2 diabetes mellitus without complications: Secondary | ICD-10-CM | POA: Diagnosis not present

## 2022-07-27 DIAGNOSIS — Z792 Long term (current) use of antibiotics: Secondary | ICD-10-CM | POA: Diagnosis not present

## 2022-07-27 DIAGNOSIS — D649 Anemia, unspecified: Secondary | ICD-10-CM | POA: Diagnosis not present

## 2022-07-27 DIAGNOSIS — Z940 Kidney transplant status: Secondary | ICD-10-CM | POA: Diagnosis not present

## 2022-07-27 DIAGNOSIS — Z79899 Other long term (current) drug therapy: Secondary | ICD-10-CM | POA: Diagnosis not present

## 2022-07-27 DIAGNOSIS — Z4822 Encounter for aftercare following kidney transplant: Secondary | ICD-10-CM | POA: Diagnosis not present

## 2022-07-27 DIAGNOSIS — E785 Hyperlipidemia, unspecified: Secondary | ICD-10-CM | POA: Diagnosis not present

## 2022-07-27 DIAGNOSIS — Z794 Long term (current) use of insulin: Secondary | ICD-10-CM | POA: Diagnosis not present

## 2022-07-27 DIAGNOSIS — Z79624 Long term (current) use of inhibitors of nucleotide synthesis: Secondary | ICD-10-CM | POA: Diagnosis not present

## 2022-07-27 DIAGNOSIS — I10 Essential (primary) hypertension: Secondary | ICD-10-CM | POA: Diagnosis not present

## 2022-07-27 DIAGNOSIS — E039 Hypothyroidism, unspecified: Secondary | ICD-10-CM | POA: Diagnosis not present

## 2022-08-03 DIAGNOSIS — I10 Essential (primary) hypertension: Secondary | ICD-10-CM | POA: Diagnosis not present

## 2022-08-03 DIAGNOSIS — Z299 Encounter for prophylactic measures, unspecified: Secondary | ICD-10-CM | POA: Diagnosis not present

## 2022-08-03 DIAGNOSIS — G4700 Insomnia, unspecified: Secondary | ICD-10-CM | POA: Diagnosis not present

## 2022-08-03 DIAGNOSIS — J449 Chronic obstructive pulmonary disease, unspecified: Secondary | ICD-10-CM | POA: Diagnosis not present

## 2022-08-03 DIAGNOSIS — Z940 Kidney transplant status: Secondary | ICD-10-CM | POA: Diagnosis not present

## 2022-08-06 IMAGING — DX DG CHEST 1V PORT
1 series · 1 of 1 positions shown · non-contrast
Comparison: Chest radiograph 02/03/2021.

CLINICAL DATA: Central line.

EXAM:
PORTABLE CHEST 1 VIEW

[chest ap]
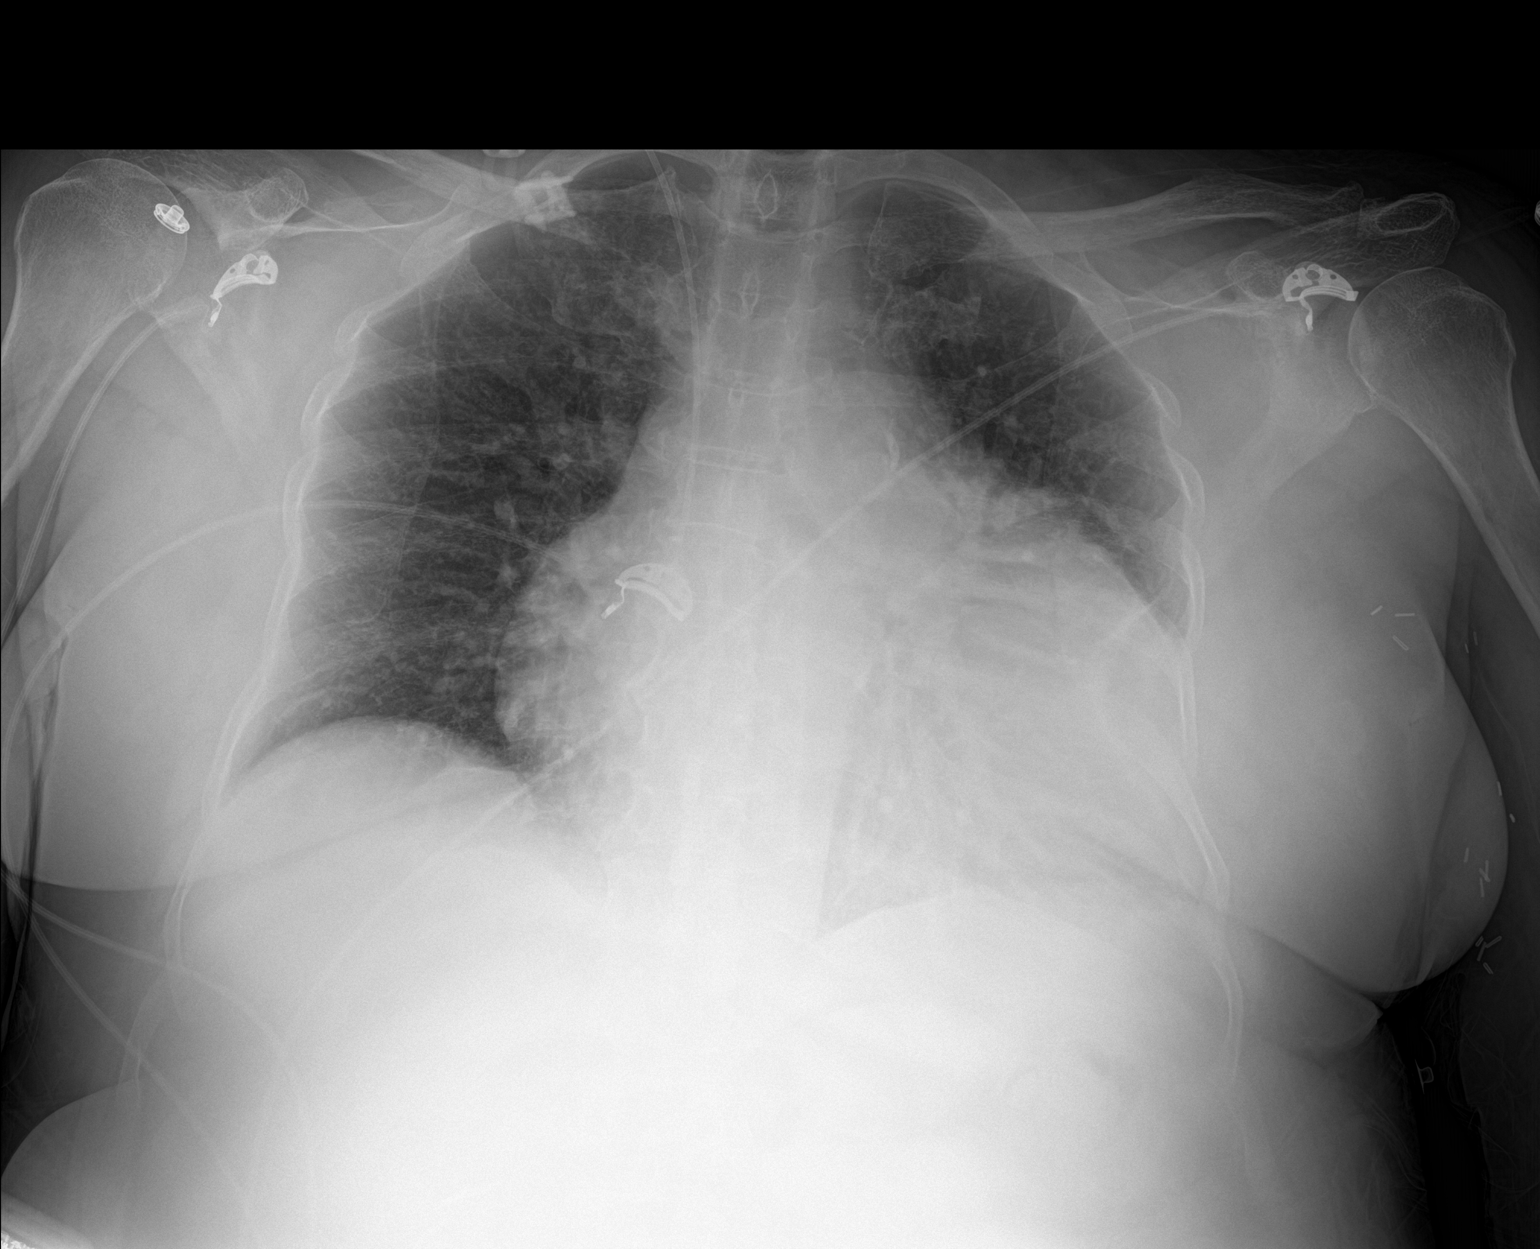

[1 of 1 positions shown; findings below may reference images not displayed]

FINDINGS: Central venous catheter tip projects over the superior vena cava.
Monitoring leads overlie the patient. Stable cardiomegaly.
Heterogeneous opacities lung bases may represent atelectasis. No
pleural effusion or pneumothorax. Osseous structures unremarkable.
IMPRESSION: Central venous catheter tip projects over the superior vena cava.

Cardiomegaly.

Atelectasis versus scarring.

## 2022-08-11 DIAGNOSIS — H25813 Combined forms of age-related cataract, bilateral: Secondary | ICD-10-CM | POA: Diagnosis not present

## 2022-08-11 DIAGNOSIS — E103293 Type 1 diabetes mellitus with mild nonproliferative diabetic retinopathy without macular edema, bilateral: Secondary | ICD-10-CM | POA: Diagnosis not present

## 2022-08-11 DIAGNOSIS — Z794 Long term (current) use of insulin: Secondary | ICD-10-CM | POA: Diagnosis not present

## 2022-08-19 DIAGNOSIS — I10 Essential (primary) hypertension: Secondary | ICD-10-CM | POA: Diagnosis not present

## 2022-08-31 DIAGNOSIS — E039 Hypothyroidism, unspecified: Secondary | ICD-10-CM | POA: Diagnosis not present

## 2022-08-31 DIAGNOSIS — Z882 Allergy status to sulfonamides status: Secondary | ICD-10-CM | POA: Diagnosis not present

## 2022-08-31 DIAGNOSIS — I110 Hypertensive heart disease with heart failure: Secondary | ICD-10-CM | POA: Diagnosis not present

## 2022-08-31 DIAGNOSIS — Z79899 Other long term (current) drug therapy: Secondary | ICD-10-CM | POA: Diagnosis not present

## 2022-08-31 DIAGNOSIS — Z794 Long term (current) use of insulin: Secondary | ICD-10-CM | POA: Diagnosis not present

## 2022-08-31 DIAGNOSIS — Z4822 Encounter for aftercare following kidney transplant: Secondary | ICD-10-CM | POA: Diagnosis not present

## 2022-08-31 DIAGNOSIS — Z79624 Long term (current) use of inhibitors of nucleotide synthesis: Secondary | ICD-10-CM | POA: Diagnosis not present

## 2022-08-31 DIAGNOSIS — J4489 Other specified chronic obstructive pulmonary disease: Secondary | ICD-10-CM | POA: Diagnosis not present

## 2022-08-31 DIAGNOSIS — I10 Essential (primary) hypertension: Secondary | ICD-10-CM | POA: Diagnosis not present

## 2022-08-31 DIAGNOSIS — E785 Hyperlipidemia, unspecified: Secondary | ICD-10-CM | POA: Diagnosis not present

## 2022-08-31 DIAGNOSIS — Z79621 Long term (current) use of calcineurin inhibitor: Secondary | ICD-10-CM | POA: Diagnosis not present

## 2022-08-31 DIAGNOSIS — I5032 Chronic diastolic (congestive) heart failure: Secondary | ICD-10-CM | POA: Diagnosis not present

## 2022-08-31 DIAGNOSIS — K432 Incisional hernia without obstruction or gangrene: Secondary | ICD-10-CM | POA: Diagnosis not present

## 2022-08-31 DIAGNOSIS — Z940 Kidney transplant status: Secondary | ICD-10-CM | POA: Diagnosis not present

## 2022-08-31 DIAGNOSIS — Z7951 Long term (current) use of inhaled steroids: Secondary | ICD-10-CM | POA: Diagnosis not present

## 2022-08-31 DIAGNOSIS — E119 Type 2 diabetes mellitus without complications: Secondary | ICD-10-CM | POA: Diagnosis not present

## 2022-08-31 DIAGNOSIS — K469 Unspecified abdominal hernia without obstruction or gangrene: Secondary | ICD-10-CM | POA: Diagnosis not present

## 2022-08-31 DIAGNOSIS — D649 Anemia, unspecified: Secondary | ICD-10-CM | POA: Diagnosis not present

## 2022-08-31 DIAGNOSIS — Z792 Long term (current) use of antibiotics: Secondary | ICD-10-CM | POA: Diagnosis not present

## 2022-08-31 DIAGNOSIS — Z7952 Long term (current) use of systemic steroids: Secondary | ICD-10-CM | POA: Diagnosis not present

## 2022-08-31 DIAGNOSIS — D849 Immunodeficiency, unspecified: Secondary | ICD-10-CM | POA: Diagnosis not present

## 2022-08-31 DIAGNOSIS — M545 Low back pain, unspecified: Secondary | ICD-10-CM | POA: Diagnosis not present

## 2022-09-01 DIAGNOSIS — G4700 Insomnia, unspecified: Secondary | ICD-10-CM | POA: Diagnosis not present

## 2022-09-01 DIAGNOSIS — Z299 Encounter for prophylactic measures, unspecified: Secondary | ICD-10-CM | POA: Diagnosis not present

## 2022-09-01 DIAGNOSIS — J441 Chronic obstructive pulmonary disease with (acute) exacerbation: Secondary | ICD-10-CM | POA: Diagnosis not present

## 2022-09-01 DIAGNOSIS — E1165 Type 2 diabetes mellitus with hyperglycemia: Secondary | ICD-10-CM | POA: Diagnosis not present

## 2022-09-01 DIAGNOSIS — I10 Essential (primary) hypertension: Secondary | ICD-10-CM | POA: Diagnosis not present

## 2022-09-01 DIAGNOSIS — I5032 Chronic diastolic (congestive) heart failure: Secondary | ICD-10-CM | POA: Diagnosis not present

## 2022-09-19 DIAGNOSIS — I10 Essential (primary) hypertension: Secondary | ICD-10-CM | POA: Diagnosis not present

## 2022-09-28 DIAGNOSIS — E11649 Type 2 diabetes mellitus with hypoglycemia without coma: Secondary | ICD-10-CM | POA: Diagnosis not present

## 2022-09-28 DIAGNOSIS — D649 Anemia, unspecified: Secondary | ICD-10-CM | POA: Diagnosis not present

## 2022-09-28 DIAGNOSIS — Z882 Allergy status to sulfonamides status: Secondary | ICD-10-CM | POA: Diagnosis not present

## 2022-09-28 DIAGNOSIS — K432 Incisional hernia without obstruction or gangrene: Secondary | ICD-10-CM | POA: Diagnosis not present

## 2022-09-28 DIAGNOSIS — I10 Essential (primary) hypertension: Secondary | ICD-10-CM | POA: Diagnosis not present

## 2022-09-28 DIAGNOSIS — Z940 Kidney transplant status: Secondary | ICD-10-CM | POA: Diagnosis not present

## 2022-09-28 DIAGNOSIS — Z79624 Long term (current) use of inhibitors of nucleotide synthesis: Secondary | ICD-10-CM | POA: Diagnosis not present

## 2022-09-28 DIAGNOSIS — Z794 Long term (current) use of insulin: Secondary | ICD-10-CM | POA: Diagnosis not present

## 2022-09-28 DIAGNOSIS — E875 Hyperkalemia: Secondary | ICD-10-CM | POA: Diagnosis not present

## 2022-09-28 DIAGNOSIS — Z888 Allergy status to other drugs, medicaments and biological substances status: Secondary | ICD-10-CM | POA: Diagnosis not present

## 2022-09-28 DIAGNOSIS — Z5181 Encounter for therapeutic drug level monitoring: Secondary | ICD-10-CM | POA: Diagnosis not present

## 2022-09-28 DIAGNOSIS — Z7952 Long term (current) use of systemic steroids: Secondary | ICD-10-CM | POA: Diagnosis not present

## 2022-09-28 DIAGNOSIS — B259 Cytomegaloviral disease, unspecified: Secondary | ICD-10-CM | POA: Diagnosis not present

## 2022-09-28 DIAGNOSIS — E119 Type 2 diabetes mellitus without complications: Secondary | ICD-10-CM | POA: Diagnosis not present

## 2022-09-28 DIAGNOSIS — E039 Hypothyroidism, unspecified: Secondary | ICD-10-CM | POA: Diagnosis not present

## 2022-09-28 DIAGNOSIS — Z4822 Encounter for aftercare following kidney transplant: Secondary | ICD-10-CM | POA: Diagnosis not present

## 2022-09-28 DIAGNOSIS — Z79899 Other long term (current) drug therapy: Secondary | ICD-10-CM | POA: Diagnosis not present

## 2022-09-28 DIAGNOSIS — Z91040 Latex allergy status: Secondary | ICD-10-CM | POA: Diagnosis not present

## 2022-09-28 DIAGNOSIS — D849 Immunodeficiency, unspecified: Secondary | ICD-10-CM | POA: Diagnosis not present

## 2022-09-28 DIAGNOSIS — Z79621 Long term (current) use of calcineurin inhibitor: Secondary | ICD-10-CM | POA: Diagnosis not present

## 2022-10-01 DIAGNOSIS — R6889 Other general symptoms and signs: Secondary | ICD-10-CM | POA: Diagnosis not present

## 2022-10-01 DIAGNOSIS — Z87891 Personal history of nicotine dependence: Secondary | ICD-10-CM | POA: Diagnosis not present

## 2022-10-01 DIAGNOSIS — R1111 Vomiting without nausea: Secondary | ICD-10-CM | POA: Diagnosis not present

## 2022-10-01 DIAGNOSIS — R110 Nausea: Secondary | ICD-10-CM | POA: Diagnosis not present

## 2022-10-01 DIAGNOSIS — Z91018 Allergy to other foods: Secondary | ICD-10-CM | POA: Diagnosis not present

## 2022-10-01 DIAGNOSIS — Z91040 Latex allergy status: Secondary | ICD-10-CM | POA: Diagnosis not present

## 2022-10-01 DIAGNOSIS — R112 Nausea with vomiting, unspecified: Secondary | ICD-10-CM | POA: Diagnosis not present

## 2022-10-01 DIAGNOSIS — Z743 Need for continuous supervision: Secondary | ICD-10-CM | POA: Diagnosis not present

## 2022-10-01 DIAGNOSIS — Z882 Allergy status to sulfonamides status: Secondary | ICD-10-CM | POA: Diagnosis not present

## 2022-10-01 DIAGNOSIS — R1110 Vomiting, unspecified: Secondary | ICD-10-CM | POA: Diagnosis not present

## 2022-10-01 DIAGNOSIS — Z888 Allergy status to other drugs, medicaments and biological substances status: Secondary | ICD-10-CM | POA: Diagnosis not present

## 2022-10-10 DIAGNOSIS — Z940 Kidney transplant status: Secondary | ICD-10-CM | POA: Diagnosis not present

## 2022-10-19 DIAGNOSIS — I10 Essential (primary) hypertension: Secondary | ICD-10-CM | POA: Diagnosis not present

## 2022-10-26 DIAGNOSIS — E119 Type 2 diabetes mellitus without complications: Secondary | ICD-10-CM | POA: Diagnosis not present

## 2022-10-26 DIAGNOSIS — Z79899 Other long term (current) drug therapy: Secondary | ICD-10-CM | POA: Diagnosis not present

## 2022-10-26 DIAGNOSIS — Z940 Kidney transplant status: Secondary | ICD-10-CM | POA: Diagnosis not present

## 2022-10-26 DIAGNOSIS — I10 Essential (primary) hypertension: Secondary | ICD-10-CM | POA: Diagnosis not present

## 2022-10-26 DIAGNOSIS — Z794 Long term (current) use of insulin: Secondary | ICD-10-CM | POA: Diagnosis not present

## 2022-10-26 DIAGNOSIS — D849 Immunodeficiency, unspecified: Secondary | ICD-10-CM | POA: Diagnosis not present

## 2022-11-15 DIAGNOSIS — E103293 Type 1 diabetes mellitus with mild nonproliferative diabetic retinopathy without macular edema, bilateral: Secondary | ICD-10-CM | POA: Diagnosis not present

## 2022-11-19 DIAGNOSIS — I10 Essential (primary) hypertension: Secondary | ICD-10-CM | POA: Diagnosis not present

## 2022-11-21 DIAGNOSIS — I10 Essential (primary) hypertension: Secondary | ICD-10-CM | POA: Diagnosis not present

## 2022-11-21 DIAGNOSIS — I5032 Chronic diastolic (congestive) heart failure: Secondary | ICD-10-CM | POA: Diagnosis not present

## 2022-11-24 DIAGNOSIS — Z940 Kidney transplant status: Secondary | ICD-10-CM | POA: Diagnosis not present

## 2022-11-24 DIAGNOSIS — E119 Type 2 diabetes mellitus without complications: Secondary | ICD-10-CM | POA: Diagnosis not present

## 2022-11-24 DIAGNOSIS — D849 Immunodeficiency, unspecified: Secondary | ICD-10-CM | POA: Diagnosis not present

## 2022-11-24 DIAGNOSIS — I10 Essential (primary) hypertension: Secondary | ICD-10-CM | POA: Diagnosis not present

## 2022-11-24 DIAGNOSIS — Z794 Long term (current) use of insulin: Secondary | ICD-10-CM | POA: Diagnosis not present

## 2022-11-24 DIAGNOSIS — Z79899 Other long term (current) drug therapy: Secondary | ICD-10-CM | POA: Diagnosis not present

## 2022-11-24 DIAGNOSIS — B259 Cytomegaloviral disease, unspecified: Secondary | ICD-10-CM | POA: Diagnosis not present

## 2022-12-02 DIAGNOSIS — I10 Essential (primary) hypertension: Secondary | ICD-10-CM | POA: Diagnosis not present

## 2022-12-02 DIAGNOSIS — N184 Chronic kidney disease, stage 4 (severe): Secondary | ICD-10-CM | POA: Diagnosis not present

## 2022-12-02 DIAGNOSIS — E1165 Type 2 diabetes mellitus with hyperglycemia: Secondary | ICD-10-CM | POA: Diagnosis not present

## 2022-12-02 DIAGNOSIS — I5032 Chronic diastolic (congestive) heart failure: Secondary | ICD-10-CM | POA: Diagnosis not present

## 2022-12-02 DIAGNOSIS — Z299 Encounter for prophylactic measures, unspecified: Secondary | ICD-10-CM | POA: Diagnosis not present

## 2022-12-02 DIAGNOSIS — M7050 Other bursitis of knee, unspecified knee: Secondary | ICD-10-CM | POA: Diagnosis not present

## 2022-12-20 DIAGNOSIS — I10 Essential (primary) hypertension: Secondary | ICD-10-CM | POA: Diagnosis not present

## 2022-12-22 DIAGNOSIS — Z940 Kidney transplant status: Secondary | ICD-10-CM | POA: Diagnosis not present

## 2022-12-22 DIAGNOSIS — Z79899 Other long term (current) drug therapy: Secondary | ICD-10-CM | POA: Diagnosis not present

## 2022-12-22 DIAGNOSIS — E119 Type 2 diabetes mellitus without complications: Secondary | ICD-10-CM | POA: Diagnosis not present

## 2022-12-22 DIAGNOSIS — D849 Immunodeficiency, unspecified: Secondary | ICD-10-CM | POA: Diagnosis not present

## 2022-12-22 DIAGNOSIS — I10 Essential (primary) hypertension: Secondary | ICD-10-CM | POA: Diagnosis not present

## 2022-12-22 DIAGNOSIS — Z794 Long term (current) use of insulin: Secondary | ICD-10-CM | POA: Diagnosis not present

## 2022-12-22 DIAGNOSIS — B259 Cytomegaloviral disease, unspecified: Secondary | ICD-10-CM | POA: Diagnosis not present

## 2023-01-06 DIAGNOSIS — M25562 Pain in left knee: Secondary | ICD-10-CM | POA: Diagnosis not present

## 2023-01-20 DIAGNOSIS — I10 Essential (primary) hypertension: Secondary | ICD-10-CM | POA: Diagnosis not present

## 2023-01-20 DIAGNOSIS — M1712 Unilateral primary osteoarthritis, left knee: Secondary | ICD-10-CM | POA: Diagnosis not present

## 2023-01-25 DIAGNOSIS — Z940 Kidney transplant status: Secondary | ICD-10-CM | POA: Diagnosis not present

## 2023-01-25 DIAGNOSIS — Z79899 Other long term (current) drug therapy: Secondary | ICD-10-CM | POA: Diagnosis not present

## 2023-01-27 DIAGNOSIS — M1712 Unilateral primary osteoarthritis, left knee: Secondary | ICD-10-CM | POA: Diagnosis not present

## 2023-01-30 IMAGING — MG MM DIGITAL SCREENING BILAT W/ TOMO AND CAD
8 series · 9 of 24 positions shown · non-contrast
Comparison: Previous exam(s).

CLINICAL DATA: Screening.

EXAM:
DIGITAL SCREENING BILATERAL MAMMOGRAM WITH TOMOSYNTHESIS AND CAD
TECHNIQUE: Bilateral screening digital craniocaudal and mediolateral oblique
mammograms were obtained. Bilateral screening digital breast
tomosynthesis was performed. The images were evaluated with
computer-aided detection.

[L CC synth-2D]
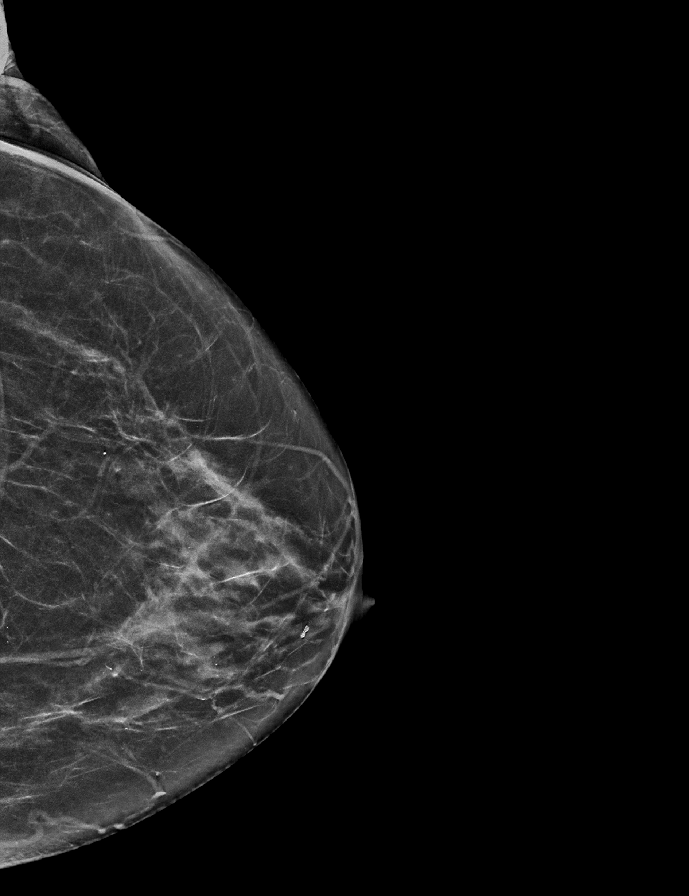

[R MLO synth-2D]
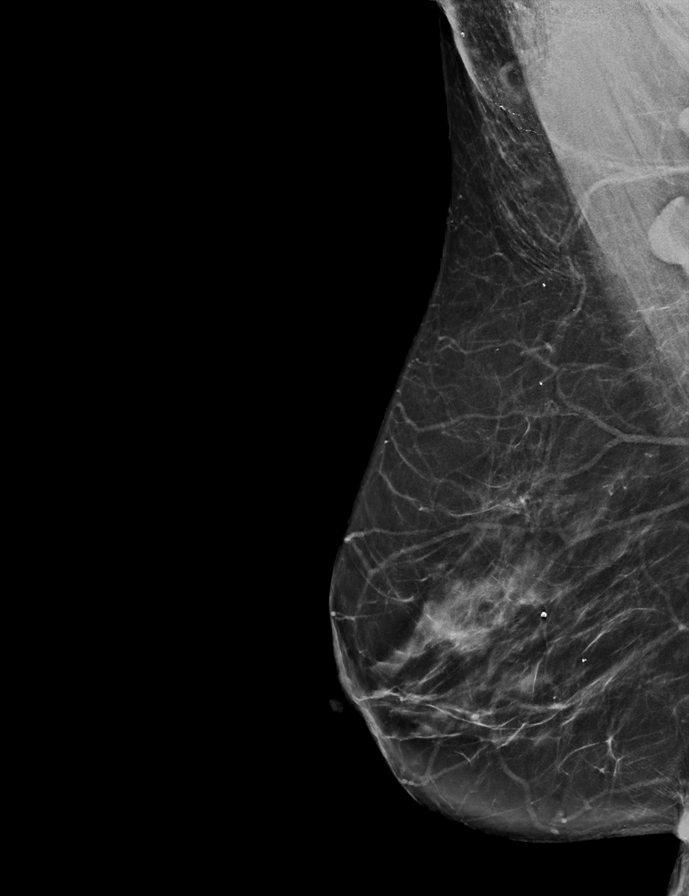

[L MLO synth-2D]
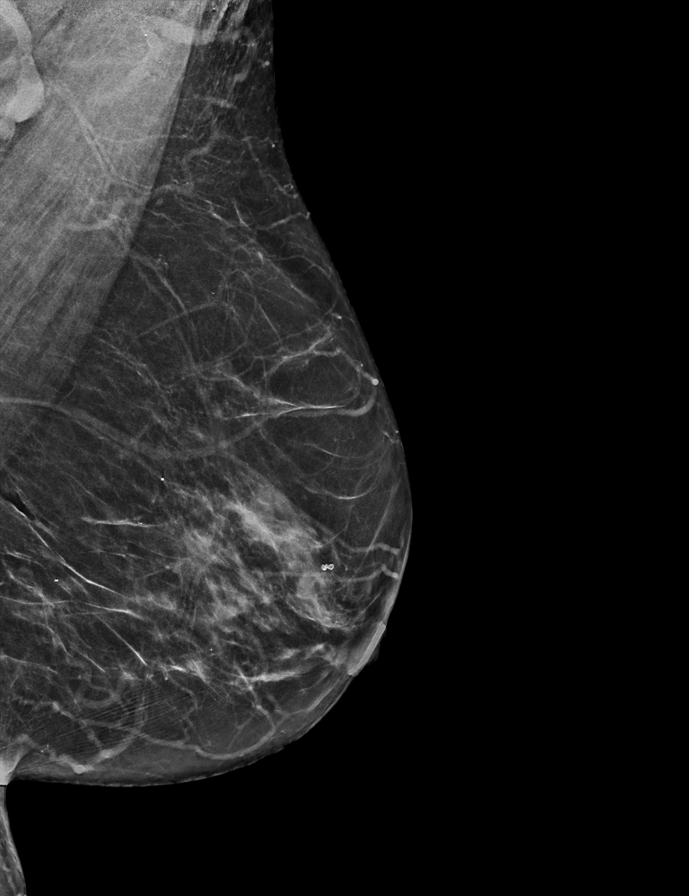

[R CC synth-2D]
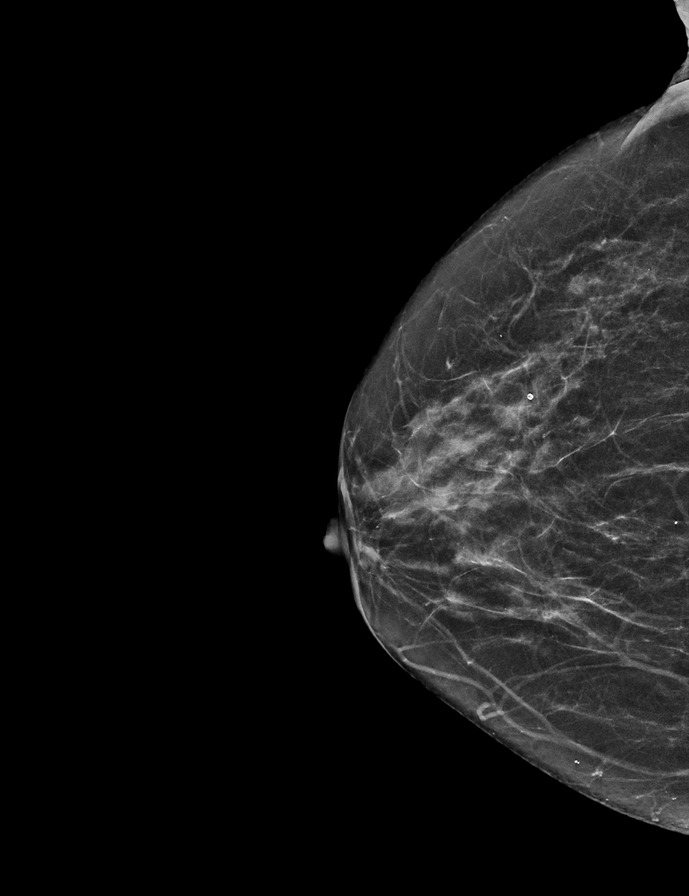

[R MLO tomo · 2 of 63 frames shown]
[frame 21/63]
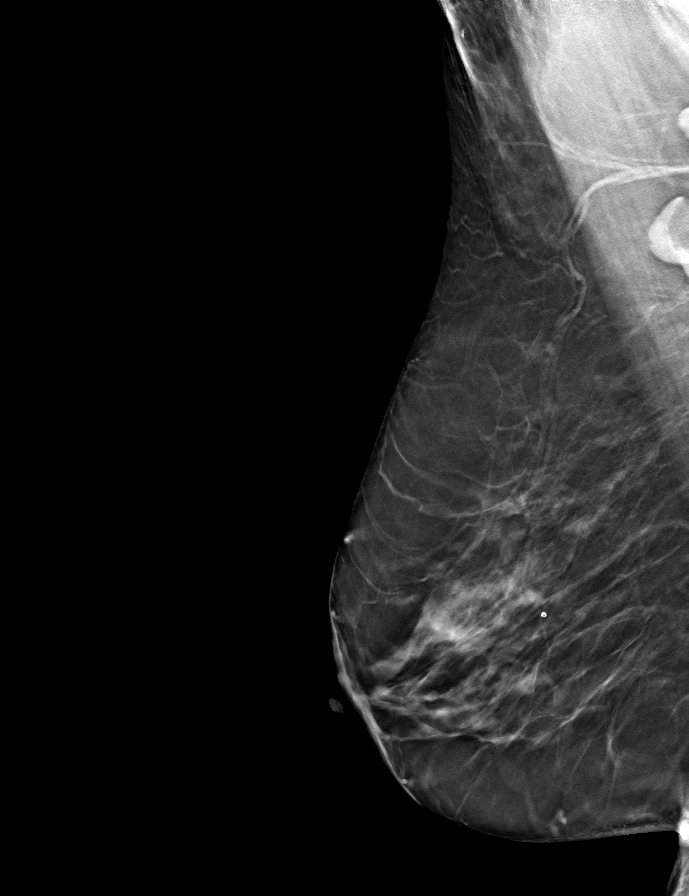
[frame 32/63]
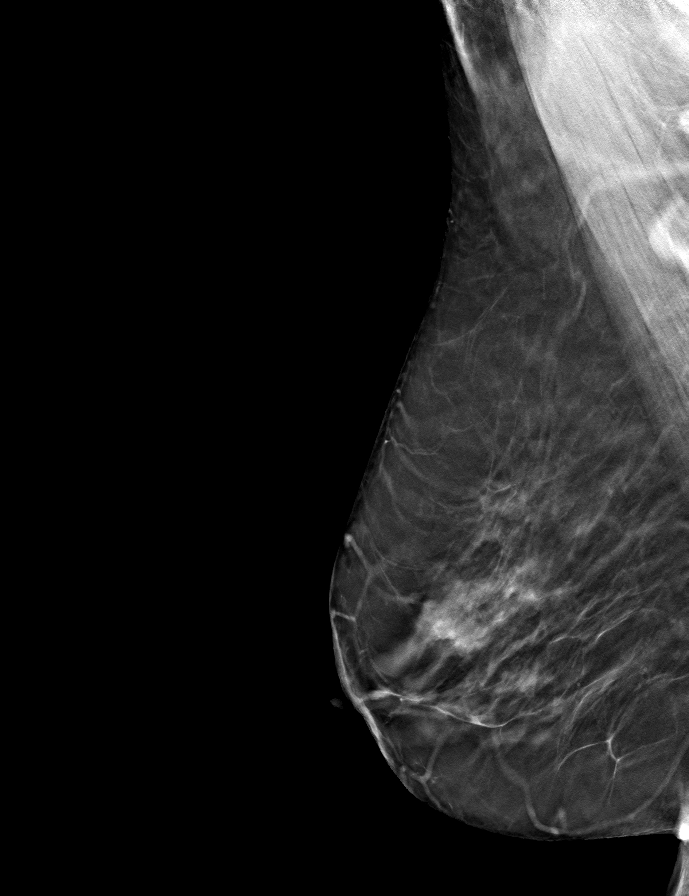

[R CC tomo · tomo slice 25/48.0]
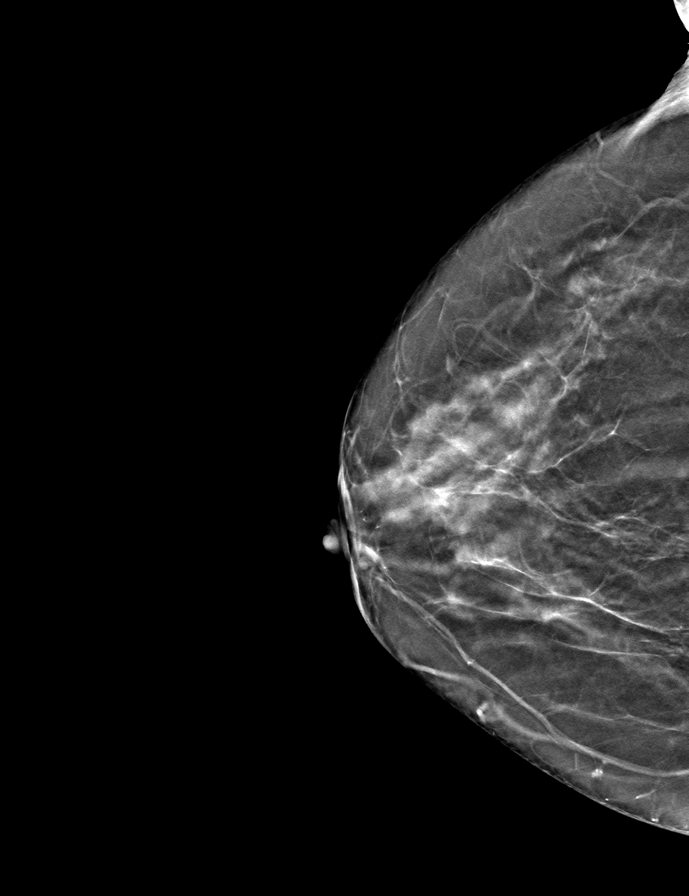

[L CC tomo · tomo slice 28/55.0]
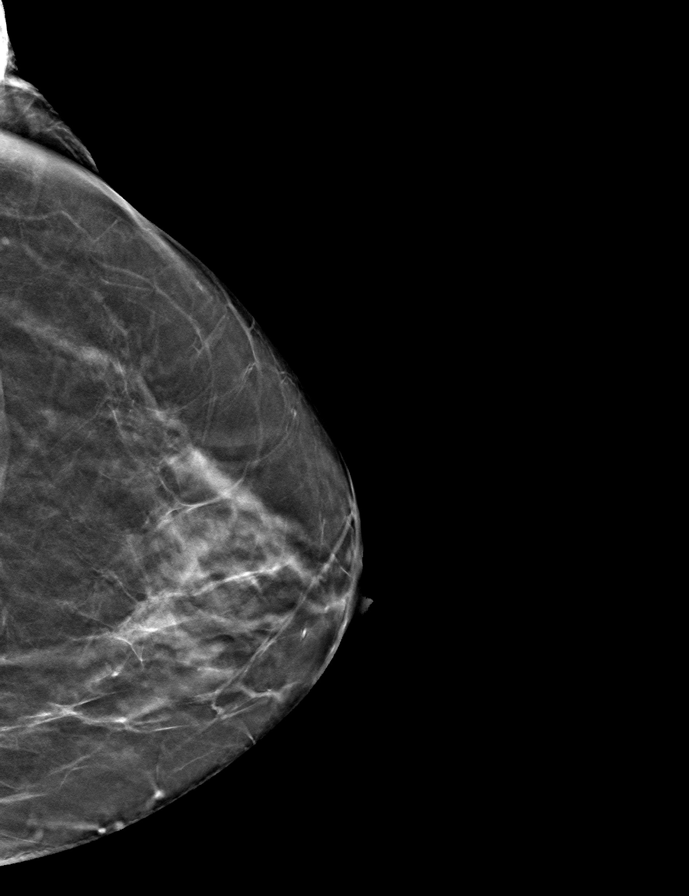

[L MLO tomo · tomo slice 28/55.0]
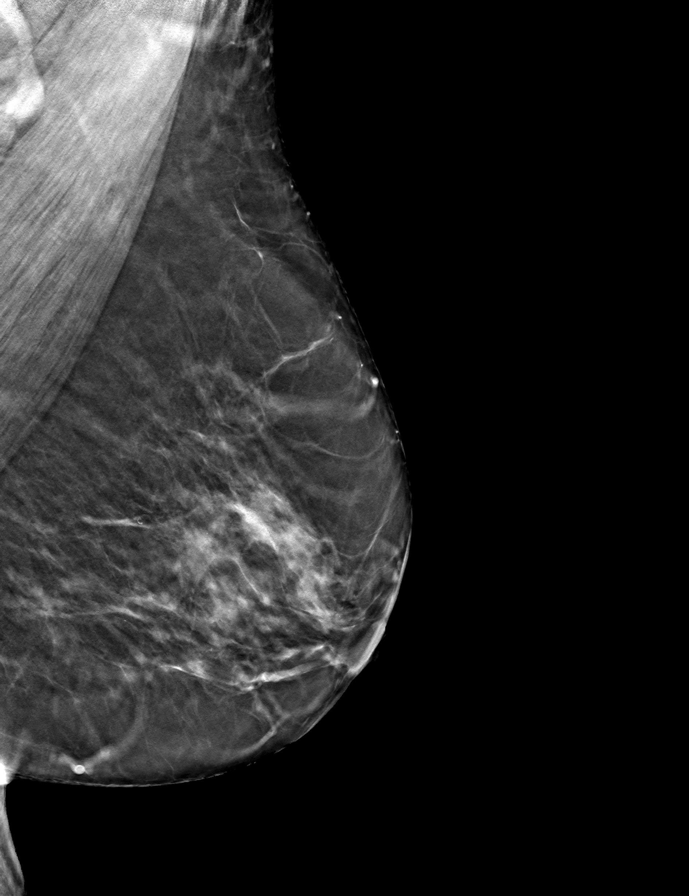

[9 of 24 positions shown; findings below may reference images not displayed]

ACR Breast Density Category c: The breast tissue is heterogeneously
dense, which may obscure small masses.
FINDINGS: There are no findings suspicious for malignancy.
IMPRESSION: No mammographic evidence of malignancy. A result letter of this
screening mammogram will be mailed directly to the patient.

RECOMMENDATION:
Screening mammogram in one year. (Code:Q3-W-BC3)

BI-RADS CATEGORY  1: Negative.

## 2023-02-08 DIAGNOSIS — R519 Headache, unspecified: Secondary | ICD-10-CM | POA: Diagnosis not present

## 2023-02-08 DIAGNOSIS — I10 Essential (primary) hypertension: Secondary | ICD-10-CM | POA: Diagnosis not present

## 2023-02-08 DIAGNOSIS — Z299 Encounter for prophylactic measures, unspecified: Secondary | ICD-10-CM | POA: Diagnosis not present

## 2023-02-08 DIAGNOSIS — N183 Chronic kidney disease, stage 3 unspecified: Secondary | ICD-10-CM | POA: Diagnosis not present

## 2023-02-19 DIAGNOSIS — I10 Essential (primary) hypertension: Secondary | ICD-10-CM | POA: Diagnosis not present

## 2023-02-22 DIAGNOSIS — Z940 Kidney transplant status: Secondary | ICD-10-CM | POA: Diagnosis not present

## 2023-02-22 DIAGNOSIS — Z79899 Other long term (current) drug therapy: Secondary | ICD-10-CM | POA: Diagnosis not present

## 2023-02-22 DIAGNOSIS — D849 Immunodeficiency, unspecified: Secondary | ICD-10-CM | POA: Diagnosis not present

## 2023-02-22 DIAGNOSIS — I10 Essential (primary) hypertension: Secondary | ICD-10-CM | POA: Diagnosis not present

## 2023-02-22 DIAGNOSIS — Z794 Long term (current) use of insulin: Secondary | ICD-10-CM | POA: Diagnosis not present

## 2023-02-22 DIAGNOSIS — E119 Type 2 diabetes mellitus without complications: Secondary | ICD-10-CM | POA: Diagnosis not present

## 2023-03-07 DIAGNOSIS — N184 Chronic kidney disease, stage 4 (severe): Secondary | ICD-10-CM | POA: Diagnosis not present

## 2023-03-07 DIAGNOSIS — I10 Essential (primary) hypertension: Secondary | ICD-10-CM | POA: Diagnosis not present

## 2023-03-07 DIAGNOSIS — E1122 Type 2 diabetes mellitus with diabetic chronic kidney disease: Secondary | ICD-10-CM | POA: Diagnosis not present

## 2023-03-07 DIAGNOSIS — J449 Chronic obstructive pulmonary disease, unspecified: Secondary | ICD-10-CM | POA: Diagnosis not present

## 2023-03-07 DIAGNOSIS — E1165 Type 2 diabetes mellitus with hyperglycemia: Secondary | ICD-10-CM | POA: Diagnosis not present

## 2023-03-07 DIAGNOSIS — Z299 Encounter for prophylactic measures, unspecified: Secondary | ICD-10-CM | POA: Diagnosis not present

## 2023-03-28 ENCOUNTER — Telehealth: Payer: Self-pay | Admitting: *Deleted

## 2023-03-28 NOTE — Patient Outreach (Signed)
  Care Coordination   03/28/2023 Name: Cindy Park MRN: [id number] DOB: October 11, 1955   Care Coordination Outreach Attempts:  An unsuccessful telephone outreach was attempted today to offer the patient information about available care coordination services.  Follow Up Plan:  Additional outreach attempts will be made to offer the patient care coordination information and services.   Encounter Outcome:  No Answer. Left HIPAA compliant VM.   Care Coordination Interventions:  No, not indicated    Demetrios Loll, BSN, RN-BC RN Care Coordinator [facility]  [facility] Direct Dial: [phone] Main #: [phone]

## 2023-03-29 DIAGNOSIS — E119 Type 2 diabetes mellitus without complications: Secondary | ICD-10-CM | POA: Diagnosis not present

## 2023-03-29 DIAGNOSIS — Z79621 Long term (current) use of calcineurin inhibitor: Secondary | ICD-10-CM | POA: Diagnosis not present

## 2023-03-29 DIAGNOSIS — Z789 Other specified health status: Secondary | ICD-10-CM | POA: Diagnosis not present

## 2023-03-29 DIAGNOSIS — Z940 Kidney transplant status: Secondary | ICD-10-CM | POA: Diagnosis not present

## 2023-03-29 DIAGNOSIS — Z794 Long term (current) use of insulin: Secondary | ICD-10-CM | POA: Diagnosis not present

## 2023-03-29 DIAGNOSIS — D849 Immunodeficiency, unspecified: Secondary | ICD-10-CM | POA: Diagnosis not present

## 2023-03-29 DIAGNOSIS — Z5181 Encounter for therapeutic drug level monitoring: Secondary | ICD-10-CM | POA: Diagnosis not present

## 2023-03-29 DIAGNOSIS — B259 Cytomegaloviral disease, unspecified: Secondary | ICD-10-CM | POA: Diagnosis not present

## 2023-03-29 DIAGNOSIS — Z4822 Encounter for aftercare following kidney transplant: Secondary | ICD-10-CM | POA: Diagnosis not present

## 2023-03-29 DIAGNOSIS — Z79899 Other long term (current) drug therapy: Secondary | ICD-10-CM | POA: Diagnosis not present

## 2023-03-29 DIAGNOSIS — Z13220 Encounter for screening for lipoid disorders: Secondary | ICD-10-CM | POA: Diagnosis not present

## 2023-04-10 DIAGNOSIS — E1129 Type 2 diabetes mellitus with other diabetic kidney complication: Secondary | ICD-10-CM | POA: Diagnosis not present

## 2023-04-10 DIAGNOSIS — Z299 Encounter for prophylactic measures, unspecified: Secondary | ICD-10-CM | POA: Diagnosis not present

## 2023-04-10 DIAGNOSIS — I5032 Chronic diastolic (congestive) heart failure: Secondary | ICD-10-CM | POA: Diagnosis not present

## 2023-04-10 DIAGNOSIS — R809 Proteinuria, unspecified: Secondary | ICD-10-CM | POA: Diagnosis not present

## 2023-04-10 DIAGNOSIS — I10 Essential (primary) hypertension: Secondary | ICD-10-CM | POA: Diagnosis not present

## 2023-04-10 DIAGNOSIS — R6889 Other general symptoms and signs: Secondary | ICD-10-CM | POA: Diagnosis not present

## 2023-04-25 DIAGNOSIS — R0600 Dyspnea, unspecified: Secondary | ICD-10-CM | POA: Diagnosis not present

## 2023-04-25 DIAGNOSIS — I10 Essential (primary) hypertension: Secondary | ICD-10-CM | POA: Diagnosis not present

## 2023-04-25 DIAGNOSIS — I5032 Chronic diastolic (congestive) heart failure: Secondary | ICD-10-CM | POA: Diagnosis not present

## 2023-04-25 DIAGNOSIS — I3139 Other pericardial effusion (noninflammatory): Secondary | ICD-10-CM | POA: Diagnosis not present

## 2023-05-03 DIAGNOSIS — I10 Essential (primary) hypertension: Secondary | ICD-10-CM | POA: Diagnosis not present

## 2023-05-03 DIAGNOSIS — N183 Chronic kidney disease, stage 3 unspecified: Secondary | ICD-10-CM | POA: Diagnosis not present

## 2023-05-03 DIAGNOSIS — R0602 Shortness of breath: Secondary | ICD-10-CM | POA: Diagnosis not present

## 2023-05-03 DIAGNOSIS — Z5181 Encounter for therapeutic drug level monitoring: Secondary | ICD-10-CM | POA: Diagnosis not present

## 2023-05-03 DIAGNOSIS — I129 Hypertensive chronic kidney disease with stage 1 through stage 4 chronic kidney disease, or unspecified chronic kidney disease: Secondary | ICD-10-CM | POA: Diagnosis not present

## 2023-05-03 DIAGNOSIS — R600 Localized edema: Secondary | ICD-10-CM | POA: Diagnosis not present

## 2023-05-03 DIAGNOSIS — Z79621 Long term (current) use of calcineurin inhibitor: Secondary | ICD-10-CM | POA: Diagnosis not present

## 2023-05-03 DIAGNOSIS — Z4822 Encounter for aftercare following kidney transplant: Secondary | ICD-10-CM | POA: Diagnosis not present

## 2023-05-03 DIAGNOSIS — D849 Immunodeficiency, unspecified: Secondary | ICD-10-CM | POA: Diagnosis not present

## 2023-05-03 DIAGNOSIS — E1122 Type 2 diabetes mellitus with diabetic chronic kidney disease: Secondary | ICD-10-CM | POA: Diagnosis not present

## 2023-05-03 DIAGNOSIS — Z940 Kidney transplant status: Secondary | ICD-10-CM | POA: Diagnosis not present

## 2023-05-16 DIAGNOSIS — Z940 Kidney transplant status: Secondary | ICD-10-CM | POA: Diagnosis not present

## 2023-05-26 DIAGNOSIS — R0600 Dyspnea, unspecified: Secondary | ICD-10-CM | POA: Diagnosis not present

## 2023-05-31 DIAGNOSIS — Z4822 Encounter for aftercare following kidney transplant: Secondary | ICD-10-CM | POA: Diagnosis not present

## 2023-05-31 DIAGNOSIS — K469 Unspecified abdominal hernia without obstruction or gangrene: Secondary | ICD-10-CM | POA: Diagnosis not present

## 2023-05-31 DIAGNOSIS — Z79621 Long term (current) use of calcineurin inhibitor: Secondary | ICD-10-CM | POA: Diagnosis not present

## 2023-05-31 DIAGNOSIS — D849 Immunodeficiency, unspecified: Secondary | ICD-10-CM | POA: Diagnosis not present

## 2023-05-31 DIAGNOSIS — Z949 Transplanted organ and tissue status, unspecified: Secondary | ICD-10-CM | POA: Diagnosis not present

## 2023-05-31 DIAGNOSIS — I10 Essential (primary) hypertension: Secondary | ICD-10-CM | POA: Diagnosis not present

## 2023-05-31 DIAGNOSIS — N183 Chronic kidney disease, stage 3 unspecified: Secondary | ICD-10-CM | POA: Diagnosis not present

## 2023-05-31 DIAGNOSIS — E1122 Type 2 diabetes mellitus with diabetic chronic kidney disease: Secondary | ICD-10-CM | POA: Diagnosis not present

## 2023-05-31 DIAGNOSIS — E119 Type 2 diabetes mellitus without complications: Secondary | ICD-10-CM | POA: Diagnosis not present

## 2023-05-31 DIAGNOSIS — D84821 Immunodeficiency due to drugs: Secondary | ICD-10-CM | POA: Diagnosis not present

## 2023-05-31 DIAGNOSIS — E039 Hypothyroidism, unspecified: Secondary | ICD-10-CM | POA: Diagnosis not present

## 2023-05-31 DIAGNOSIS — Z794 Long term (current) use of insulin: Secondary | ICD-10-CM | POA: Diagnosis not present

## 2023-05-31 DIAGNOSIS — Z789 Other specified health status: Secondary | ICD-10-CM | POA: Diagnosis not present

## 2023-05-31 DIAGNOSIS — D649 Anemia, unspecified: Secondary | ICD-10-CM | POA: Diagnosis not present

## 2023-05-31 DIAGNOSIS — E785 Hyperlipidemia, unspecified: Secondary | ICD-10-CM | POA: Diagnosis not present

## 2023-05-31 DIAGNOSIS — Z940 Kidney transplant status: Secondary | ICD-10-CM | POA: Diagnosis not present

## 2023-05-31 DIAGNOSIS — Z79899 Other long term (current) drug therapy: Secondary | ICD-10-CM | POA: Diagnosis not present

## 2023-05-31 DIAGNOSIS — R0602 Shortness of breath: Secondary | ICD-10-CM | POA: Diagnosis not present

## 2023-05-31 DIAGNOSIS — Z5181 Encounter for therapeutic drug level monitoring: Secondary | ICD-10-CM | POA: Diagnosis not present

## 2023-06-06 DIAGNOSIS — I10 Essential (primary) hypertension: Secondary | ICD-10-CM | POA: Diagnosis not present

## 2023-06-07 DIAGNOSIS — Z299 Encounter for prophylactic measures, unspecified: Secondary | ICD-10-CM | POA: Diagnosis not present

## 2023-06-07 DIAGNOSIS — E119 Type 2 diabetes mellitus without complications: Secondary | ICD-10-CM | POA: Diagnosis not present

## 2023-06-07 DIAGNOSIS — J449 Chronic obstructive pulmonary disease, unspecified: Secondary | ICD-10-CM | POA: Diagnosis not present

## 2023-06-07 DIAGNOSIS — I10 Essential (primary) hypertension: Secondary | ICD-10-CM | POA: Diagnosis not present

## 2023-06-23 DIAGNOSIS — I10 Essential (primary) hypertension: Secondary | ICD-10-CM | POA: Diagnosis not present

## 2023-06-23 DIAGNOSIS — E1122 Type 2 diabetes mellitus with diabetic chronic kidney disease: Secondary | ICD-10-CM | POA: Diagnosis not present

## 2023-06-23 DIAGNOSIS — N184 Chronic kidney disease, stage 4 (severe): Secondary | ICD-10-CM | POA: Diagnosis not present

## 2023-06-23 DIAGNOSIS — Z299 Encounter for prophylactic measures, unspecified: Secondary | ICD-10-CM | POA: Diagnosis not present

## 2023-06-23 DIAGNOSIS — J441 Chronic obstructive pulmonary disease with (acute) exacerbation: Secondary | ICD-10-CM | POA: Diagnosis not present

## 2023-06-27 DIAGNOSIS — I5032 Chronic diastolic (congestive) heart failure: Secondary | ICD-10-CM | POA: Diagnosis not present

## 2023-06-27 DIAGNOSIS — J449 Chronic obstructive pulmonary disease, unspecified: Secondary | ICD-10-CM | POA: Diagnosis not present

## 2023-06-27 DIAGNOSIS — Z299 Encounter for prophylactic measures, unspecified: Secondary | ICD-10-CM | POA: Diagnosis not present

## 2023-06-27 DIAGNOSIS — I10 Essential (primary) hypertension: Secondary | ICD-10-CM | POA: Diagnosis not present

## 2023-06-27 DIAGNOSIS — Z7189 Other specified counseling: Secondary | ICD-10-CM | POA: Diagnosis not present

## 2023-06-27 DIAGNOSIS — Z0000 Encounter for general adult medical examination without abnormal findings: Secondary | ICD-10-CM | POA: Diagnosis not present

## 2023-06-28 DIAGNOSIS — Z79621 Long term (current) use of calcineurin inhibitor: Secondary | ICD-10-CM | POA: Diagnosis not present

## 2023-06-28 DIAGNOSIS — Z5181 Encounter for therapeutic drug level monitoring: Secondary | ICD-10-CM | POA: Diagnosis not present

## 2023-06-28 DIAGNOSIS — D849 Immunodeficiency, unspecified: Secondary | ICD-10-CM | POA: Diagnosis not present

## 2023-06-28 DIAGNOSIS — E119 Type 2 diabetes mellitus without complications: Secondary | ICD-10-CM | POA: Diagnosis not present

## 2023-06-28 DIAGNOSIS — Z794 Long term (current) use of insulin: Secondary | ICD-10-CM | POA: Diagnosis not present

## 2023-06-28 DIAGNOSIS — E049 Nontoxic goiter, unspecified: Secondary | ICD-10-CM | POA: Diagnosis not present

## 2023-06-28 DIAGNOSIS — I10 Essential (primary) hypertension: Secondary | ICD-10-CM | POA: Diagnosis not present

## 2023-06-28 DIAGNOSIS — Z4822 Encounter for aftercare following kidney transplant: Secondary | ICD-10-CM | POA: Diagnosis not present

## 2023-06-28 DIAGNOSIS — Z940 Kidney transplant status: Secondary | ICD-10-CM | POA: Diagnosis not present

## 2023-06-28 DIAGNOSIS — Z79899 Other long term (current) drug therapy: Secondary | ICD-10-CM | POA: Diagnosis not present

## 2023-06-28 DIAGNOSIS — K219 Gastro-esophageal reflux disease without esophagitis: Secondary | ICD-10-CM | POA: Diagnosis not present

## 2023-06-28 DIAGNOSIS — K581 Irritable bowel syndrome with constipation: Secondary | ICD-10-CM | POA: Diagnosis not present

## 2023-06-28 DIAGNOSIS — R768 Other specified abnormal immunological findings in serum: Secondary | ICD-10-CM | POA: Diagnosis not present

## 2023-07-05 DIAGNOSIS — Z940 Kidney transplant status: Secondary | ICD-10-CM | POA: Diagnosis not present

## 2023-07-05 DIAGNOSIS — N179 Acute kidney failure, unspecified: Secondary | ICD-10-CM | POA: Diagnosis not present

## 2023-07-06 ENCOUNTER — Other Ambulatory Visit: Payer: Self-pay | Admitting: Internal Medicine

## 2023-07-06 DIAGNOSIS — Z1231 Encounter for screening mammogram for malignant neoplasm of breast: Secondary | ICD-10-CM

## 2023-07-26 ENCOUNTER — Inpatient Hospital Stay: Admission: RE | Admit: 2023-07-26 | Payer: Medicare Other | Source: Ambulatory Visit

## 2023-07-28 DIAGNOSIS — Z23 Encounter for immunization: Secondary | ICD-10-CM | POA: Diagnosis not present

## 2023-07-28 DIAGNOSIS — I5032 Chronic diastolic (congestive) heart failure: Secondary | ICD-10-CM | POA: Diagnosis not present

## 2023-07-28 DIAGNOSIS — Z299 Encounter for prophylactic measures, unspecified: Secondary | ICD-10-CM | POA: Diagnosis not present

## 2023-07-28 DIAGNOSIS — Z0000 Encounter for general adult medical examination without abnormal findings: Secondary | ICD-10-CM | POA: Diagnosis not present

## 2023-07-28 DIAGNOSIS — J449 Chronic obstructive pulmonary disease, unspecified: Secondary | ICD-10-CM | POA: Diagnosis not present

## 2023-07-28 DIAGNOSIS — I10 Essential (primary) hypertension: Secondary | ICD-10-CM | POA: Diagnosis not present

## 2023-07-28 DIAGNOSIS — E1169 Type 2 diabetes mellitus with other specified complication: Secondary | ICD-10-CM | POA: Diagnosis not present

## 2023-08-02 DIAGNOSIS — Z79899 Other long term (current) drug therapy: Secondary | ICD-10-CM | POA: Diagnosis not present

## 2023-08-02 DIAGNOSIS — Z79621 Long term (current) use of calcineurin inhibitor: Secondary | ICD-10-CM | POA: Diagnosis not present

## 2023-08-02 DIAGNOSIS — Z4822 Encounter for aftercare following kidney transplant: Secondary | ICD-10-CM | POA: Diagnosis not present

## 2023-08-02 DIAGNOSIS — Z940 Kidney transplant status: Secondary | ICD-10-CM | POA: Diagnosis not present

## 2023-08-02 DIAGNOSIS — Z949 Transplanted organ and tissue status, unspecified: Secondary | ICD-10-CM | POA: Diagnosis not present

## 2023-08-02 DIAGNOSIS — D84821 Immunodeficiency due to drugs: Secondary | ICD-10-CM | POA: Diagnosis not present

## 2023-08-02 DIAGNOSIS — Z5181 Encounter for therapeutic drug level monitoring: Secondary | ICD-10-CM | POA: Diagnosis not present

## 2023-08-29 ENCOUNTER — Ambulatory Visit
Admission: RE | Admit: 2023-08-29 | Discharge: 2023-08-29 | Disposition: A | Discharge status: Home / Self Care | Payer: 59 | Source: Ambulatory Visit | Attending: Internal Medicine | Admitting: Internal Medicine

## 2023-08-29 DIAGNOSIS — Z1231 Encounter for screening mammogram for malignant neoplasm of breast: Secondary | ICD-10-CM | POA: Diagnosis not present

## 2023-08-30 DIAGNOSIS — Z4822 Encounter for aftercare following kidney transplant: Secondary | ICD-10-CM | POA: Diagnosis not present

## 2023-08-30 DIAGNOSIS — Z79899 Other long term (current) drug therapy: Secondary | ICD-10-CM | POA: Diagnosis not present

## 2023-08-30 DIAGNOSIS — Z79621 Long term (current) use of calcineurin inhibitor: Secondary | ICD-10-CM | POA: Diagnosis not present

## 2023-08-30 DIAGNOSIS — Z949 Transplanted organ and tissue status, unspecified: Secondary | ICD-10-CM | POA: Diagnosis not present

## 2023-08-30 DIAGNOSIS — Z940 Kidney transplant status: Secondary | ICD-10-CM | POA: Diagnosis not present

## 2023-08-30 DIAGNOSIS — I10 Essential (primary) hypertension: Secondary | ICD-10-CM | POA: Diagnosis not present

## 2023-08-30 DIAGNOSIS — D849 Immunodeficiency, unspecified: Secondary | ICD-10-CM | POA: Diagnosis not present

## 2023-08-30 DIAGNOSIS — D84821 Immunodeficiency due to drugs: Secondary | ICD-10-CM | POA: Diagnosis not present

## 2023-08-30 DIAGNOSIS — Z5181 Encounter for therapeutic drug level monitoring: Secondary | ICD-10-CM | POA: Diagnosis not present

## 2023-09-05 DIAGNOSIS — I10 Essential (primary) hypertension: Secondary | ICD-10-CM | POA: Diagnosis not present

## 2023-09-05 DIAGNOSIS — I5032 Chronic diastolic (congestive) heart failure: Secondary | ICD-10-CM | POA: Diagnosis not present

## 2023-09-05 DIAGNOSIS — Z940 Kidney transplant status: Secondary | ICD-10-CM | POA: Diagnosis not present

## 2023-09-09 DIAGNOSIS — E1122 Type 2 diabetes mellitus with diabetic chronic kidney disease: Secondary | ICD-10-CM | POA: Diagnosis not present

## 2023-09-09 DIAGNOSIS — N2581 Secondary hyperparathyroidism of renal origin: Secondary | ICD-10-CM | POA: Diagnosis not present

## 2023-09-09 DIAGNOSIS — Z940 Kidney transplant status: Secondary | ICD-10-CM | POA: Diagnosis not present

## 2023-09-28 DIAGNOSIS — Z299 Encounter for prophylactic measures, unspecified: Secondary | ICD-10-CM | POA: Diagnosis not present

## 2023-09-28 DIAGNOSIS — E1169 Type 2 diabetes mellitus with other specified complication: Secondary | ICD-10-CM | POA: Diagnosis not present

## 2023-09-28 DIAGNOSIS — I5032 Chronic diastolic (congestive) heart failure: Secondary | ICD-10-CM | POA: Diagnosis not present

## 2023-09-28 DIAGNOSIS — N184 Chronic kidney disease, stage 4 (severe): Secondary | ICD-10-CM | POA: Diagnosis not present

## 2023-09-28 DIAGNOSIS — I10 Essential (primary) hypertension: Secondary | ICD-10-CM | POA: Diagnosis not present

## 2023-10-06 DIAGNOSIS — I10 Essential (primary) hypertension: Secondary | ICD-10-CM | POA: Diagnosis not present

## 2023-10-06 DIAGNOSIS — Z299 Encounter for prophylactic measures, unspecified: Secondary | ICD-10-CM | POA: Diagnosis not present

## 2023-10-06 DIAGNOSIS — R059 Cough, unspecified: Secondary | ICD-10-CM | POA: Diagnosis not present

## 2023-10-06 DIAGNOSIS — J441 Chronic obstructive pulmonary disease with (acute) exacerbation: Secondary | ICD-10-CM | POA: Diagnosis not present

## 2023-10-06 DIAGNOSIS — E1169 Type 2 diabetes mellitus with other specified complication: Secondary | ICD-10-CM | POA: Diagnosis not present

## 2023-10-28 DIAGNOSIS — W19XXXA Unspecified fall, initial encounter: Secondary | ICD-10-CM | POA: Diagnosis not present

## 2023-10-28 DIAGNOSIS — S99911A Unspecified injury of right ankle, initial encounter: Secondary | ICD-10-CM | POA: Diagnosis not present

## 2023-10-28 DIAGNOSIS — G4700 Insomnia, unspecified: Secondary | ICD-10-CM | POA: Diagnosis not present

## 2023-10-28 DIAGNOSIS — R600 Localized edema: Secondary | ICD-10-CM | POA: Diagnosis not present

## 2023-10-28 DIAGNOSIS — S6992XA Unspecified injury of left wrist, hand and finger(s), initial encounter: Secondary | ICD-10-CM | POA: Diagnosis not present

## 2023-10-28 DIAGNOSIS — S62635A Displaced fracture of distal phalanx of left ring finger, initial encounter for closed fracture: Secondary | ICD-10-CM | POA: Diagnosis not present

## 2023-10-28 DIAGNOSIS — E1122 Type 2 diabetes mellitus with diabetic chronic kidney disease: Secondary | ICD-10-CM | POA: Diagnosis not present

## 2023-10-28 DIAGNOSIS — K219 Gastro-esophageal reflux disease without esophagitis: Secondary | ICD-10-CM | POA: Diagnosis not present

## 2023-10-28 DIAGNOSIS — Z87891 Personal history of nicotine dependence: Secondary | ICD-10-CM | POA: Diagnosis not present

## 2023-10-28 DIAGNOSIS — M25571 Pain in right ankle and joints of right foot: Secondary | ICD-10-CM | POA: Diagnosis not present

## 2023-10-28 DIAGNOSIS — I509 Heart failure, unspecified: Secondary | ICD-10-CM | POA: Diagnosis not present

## 2023-10-28 DIAGNOSIS — I130 Hypertensive heart and chronic kidney disease with heart failure and stage 1 through stage 4 chronic kidney disease, or unspecified chronic kidney disease: Secondary | ICD-10-CM | POA: Diagnosis not present

## 2023-10-28 DIAGNOSIS — N184 Chronic kidney disease, stage 4 (severe): Secondary | ICD-10-CM | POA: Diagnosis not present

## 2023-10-28 DIAGNOSIS — J4489 Other specified chronic obstructive pulmonary disease: Secondary | ICD-10-CM | POA: Diagnosis not present

## 2023-10-28 DIAGNOSIS — E785 Hyperlipidemia, unspecified: Secondary | ICD-10-CM | POA: Diagnosis not present

## 2023-10-28 DIAGNOSIS — S6982XA Other specified injuries of left wrist, hand and finger(s), initial encounter: Secondary | ICD-10-CM | POA: Diagnosis not present

## 2023-10-28 DIAGNOSIS — S62625A Displaced fracture of medial phalanx of left ring finger, initial encounter for closed fracture: Secondary | ICD-10-CM | POA: Diagnosis not present

## 2023-10-28 DIAGNOSIS — S9001XA Contusion of right ankle, initial encounter: Secondary | ICD-10-CM | POA: Diagnosis not present

## 2023-10-30 DIAGNOSIS — M79643 Pain in unspecified hand: Secondary | ICD-10-CM | POA: Diagnosis not present

## 2023-10-30 DIAGNOSIS — E1169 Type 2 diabetes mellitus with other specified complication: Secondary | ICD-10-CM | POA: Diagnosis not present

## 2023-10-30 DIAGNOSIS — Z299 Encounter for prophylactic measures, unspecified: Secondary | ICD-10-CM | POA: Diagnosis not present

## 2023-10-30 DIAGNOSIS — T148XXA Other injury of unspecified body region, initial encounter: Secondary | ICD-10-CM | POA: Diagnosis not present

## 2023-10-30 DIAGNOSIS — J441 Chronic obstructive pulmonary disease with (acute) exacerbation: Secondary | ICD-10-CM | POA: Diagnosis not present

## 2023-11-02 DIAGNOSIS — S62625A Displaced fracture of medial phalanx of left ring finger, initial encounter for closed fracture: Secondary | ICD-10-CM | POA: Diagnosis not present

## 2023-11-02 DIAGNOSIS — R2241 Localized swelling, mass and lump, right lower limb: Secondary | ICD-10-CM | POA: Diagnosis not present

## 2023-11-02 DIAGNOSIS — S62635A Displaced fracture of distal phalanx of left ring finger, initial encounter for closed fracture: Secondary | ICD-10-CM | POA: Diagnosis not present

## 2023-11-02 DIAGNOSIS — S99911A Unspecified injury of right ankle, initial encounter: Secondary | ICD-10-CM | POA: Diagnosis not present

## 2023-11-02 DIAGNOSIS — S93401A Sprain of unspecified ligament of right ankle, initial encounter: Secondary | ICD-10-CM | POA: Diagnosis not present

## 2023-11-09 DIAGNOSIS — R2241 Localized swelling, mass and lump, right lower limb: Secondary | ICD-10-CM | POA: Diagnosis not present

## 2023-11-09 DIAGNOSIS — S62625D Displaced fracture of medial phalanx of left ring finger, subsequent encounter for fracture with routine healing: Secondary | ICD-10-CM | POA: Diagnosis not present

## 2023-11-09 DIAGNOSIS — X58XXXD Exposure to other specified factors, subsequent encounter: Secondary | ICD-10-CM | POA: Diagnosis not present

## 2023-11-09 DIAGNOSIS — S99911D Unspecified injury of right ankle, subsequent encounter: Secondary | ICD-10-CM | POA: Diagnosis not present

## 2023-11-15 DIAGNOSIS — S62625D Displaced fracture of medial phalanx of left ring finger, subsequent encounter for fracture with routine healing: Secondary | ICD-10-CM | POA: Diagnosis not present

## 2023-11-23 DIAGNOSIS — I10 Essential (primary) hypertension: Secondary | ICD-10-CM | POA: Diagnosis not present

## 2023-11-23 DIAGNOSIS — Z0000 Encounter for general adult medical examination without abnormal findings: Secondary | ICD-10-CM | POA: Diagnosis not present

## 2023-11-23 DIAGNOSIS — K219 Gastro-esophageal reflux disease without esophagitis: Secondary | ICD-10-CM | POA: Diagnosis not present

## 2023-11-23 DIAGNOSIS — S62608A Fracture of unspecified phalanx of other finger, initial encounter for closed fracture: Secondary | ICD-10-CM | POA: Diagnosis not present

## 2023-11-23 DIAGNOSIS — R52 Pain, unspecified: Secondary | ICD-10-CM | POA: Diagnosis not present

## 2023-11-23 DIAGNOSIS — Z299 Encounter for prophylactic measures, unspecified: Secondary | ICD-10-CM | POA: Diagnosis not present

## 2023-11-24 DIAGNOSIS — S62625D Displaced fracture of medial phalanx of left ring finger, subsequent encounter for fracture with routine healing: Secondary | ICD-10-CM | POA: Diagnosis not present

## 2023-11-24 DIAGNOSIS — S62635D Displaced fracture of distal phalanx of left ring finger, subsequent encounter for fracture with routine healing: Secondary | ICD-10-CM | POA: Diagnosis not present

## 2023-12-01 DIAGNOSIS — R112 Nausea with vomiting, unspecified: Secondary | ICD-10-CM | POA: Diagnosis not present

## 2023-12-01 DIAGNOSIS — I10 Essential (primary) hypertension: Secondary | ICD-10-CM | POA: Diagnosis not present

## 2023-12-01 DIAGNOSIS — E119 Type 2 diabetes mellitus without complications: Secondary | ICD-10-CM | POA: Diagnosis not present

## 2023-12-01 DIAGNOSIS — Z299 Encounter for prophylactic measures, unspecified: Secondary | ICD-10-CM | POA: Diagnosis not present

## 2023-12-01 DIAGNOSIS — R52 Pain, unspecified: Secondary | ICD-10-CM | POA: Diagnosis not present

## 2023-12-01 DIAGNOSIS — I5032 Chronic diastolic (congestive) heart failure: Secondary | ICD-10-CM | POA: Diagnosis not present

## 2023-12-01 DIAGNOSIS — N183 Chronic kidney disease, stage 3 unspecified: Secondary | ICD-10-CM | POA: Diagnosis not present

## 2023-12-08 DIAGNOSIS — M24173 Other articular cartilage disorders, unspecified ankle: Secondary | ICD-10-CM | POA: Diagnosis not present

## 2023-12-08 DIAGNOSIS — M24176 Other articular cartilage disorders, unspecified foot: Secondary | ICD-10-CM | POA: Diagnosis not present

## 2023-12-08 DIAGNOSIS — M19071 Primary osteoarthritis, right ankle and foot: Secondary | ICD-10-CM | POA: Diagnosis not present

## 2023-12-08 DIAGNOSIS — M7989 Other specified soft tissue disorders: Secondary | ICD-10-CM | POA: Diagnosis not present

## 2023-12-08 DIAGNOSIS — R2241 Localized swelling, mass and lump, right lower limb: Secondary | ICD-10-CM | POA: Diagnosis not present

## 2023-12-08 DIAGNOSIS — S99921S Unspecified injury of right foot, sequela: Secondary | ICD-10-CM | POA: Diagnosis not present

## 2023-12-08 DIAGNOSIS — M79671 Pain in right foot: Secondary | ICD-10-CM | POA: Diagnosis not present

## 2023-12-11 DIAGNOSIS — S99921D Unspecified injury of right foot, subsequent encounter: Secondary | ICD-10-CM | POA: Diagnosis not present

## 2023-12-11 DIAGNOSIS — M8440XA Pathological fracture, unspecified site, initial encounter for fracture: Secondary | ICD-10-CM | POA: Diagnosis not present

## 2023-12-11 DIAGNOSIS — S62625D Displaced fracture of medial phalanx of left ring finger, subsequent encounter for fracture with routine healing: Secondary | ICD-10-CM | POA: Diagnosis not present

## 2023-12-11 DIAGNOSIS — M818 Other osteoporosis without current pathological fracture: Secondary | ICD-10-CM | POA: Diagnosis not present

## 2023-12-11 DIAGNOSIS — S62635D Displaced fracture of distal phalanx of left ring finger, subsequent encounter for fracture with routine healing: Secondary | ICD-10-CM | POA: Diagnosis not present

## 2023-12-26 DIAGNOSIS — M25641 Stiffness of right hand, not elsewhere classified: Secondary | ICD-10-CM | POA: Diagnosis not present

## 2023-12-26 DIAGNOSIS — S62625D Displaced fracture of medial phalanx of left ring finger, subsequent encounter for fracture with routine healing: Secondary | ICD-10-CM | POA: Diagnosis not present

## 2024-01-03 DIAGNOSIS — I5032 Chronic diastolic (congestive) heart failure: Secondary | ICD-10-CM | POA: Diagnosis not present

## 2024-01-03 DIAGNOSIS — I10 Essential (primary) hypertension: Secondary | ICD-10-CM | POA: Diagnosis not present

## 2024-01-03 DIAGNOSIS — E119 Type 2 diabetes mellitus without complications: Secondary | ICD-10-CM | POA: Diagnosis not present

## 2024-01-03 DIAGNOSIS — M79643 Pain in unspecified hand: Secondary | ICD-10-CM | POA: Diagnosis not present

## 2024-01-03 DIAGNOSIS — Z299 Encounter for prophylactic measures, unspecified: Secondary | ICD-10-CM | POA: Diagnosis not present

## 2024-01-03 DIAGNOSIS — E1122 Type 2 diabetes mellitus with diabetic chronic kidney disease: Secondary | ICD-10-CM | POA: Diagnosis not present

## 2024-01-08 DIAGNOSIS — S62625D Displaced fracture of medial phalanx of left ring finger, subsequent encounter for fracture with routine healing: Secondary | ICD-10-CM | POA: Diagnosis not present

## 2024-01-17 DIAGNOSIS — R2232 Localized swelling, mass and lump, left upper limb: Secondary | ICD-10-CM | POA: Diagnosis not present

## 2024-01-17 DIAGNOSIS — R809 Proteinuria, unspecified: Secondary | ICD-10-CM | POA: Diagnosis not present

## 2024-01-17 DIAGNOSIS — D631 Anemia in chronic kidney disease: Secondary | ICD-10-CM | POA: Diagnosis not present

## 2024-01-17 DIAGNOSIS — N189 Chronic kidney disease, unspecified: Secondary | ICD-10-CM | POA: Diagnosis not present

## 2024-01-19 DIAGNOSIS — I129 Hypertensive chronic kidney disease with stage 1 through stage 4 chronic kidney disease, or unspecified chronic kidney disease: Secondary | ICD-10-CM | POA: Diagnosis not present

## 2024-01-19 DIAGNOSIS — D638 Anemia in other chronic diseases classified elsewhere: Secondary | ICD-10-CM | POA: Diagnosis not present

## 2024-01-19 DIAGNOSIS — N184 Chronic kidney disease, stage 4 (severe): Secondary | ICD-10-CM | POA: Diagnosis not present

## 2024-01-19 DIAGNOSIS — E1129 Type 2 diabetes mellitus with other diabetic kidney complication: Secondary | ICD-10-CM | POA: Diagnosis not present

## 2024-01-25 DIAGNOSIS — S62625D Displaced fracture of medial phalanx of left ring finger, subsequent encounter for fracture with routine healing: Secondary | ICD-10-CM | POA: Diagnosis not present

## 2024-01-25 DIAGNOSIS — M25641 Stiffness of right hand, not elsewhere classified: Secondary | ICD-10-CM | POA: Diagnosis not present

## 2024-02-12 DIAGNOSIS — E119 Type 2 diabetes mellitus without complications: Secondary | ICD-10-CM | POA: Diagnosis not present

## 2024-02-12 DIAGNOSIS — E211 Secondary hyperparathyroidism, not elsewhere classified: Secondary | ICD-10-CM | POA: Diagnosis not present

## 2024-02-12 DIAGNOSIS — R809 Proteinuria, unspecified: Secondary | ICD-10-CM | POA: Diagnosis not present

## 2024-02-12 DIAGNOSIS — I10 Essential (primary) hypertension: Secondary | ICD-10-CM | POA: Diagnosis not present

## 2024-02-12 DIAGNOSIS — N189 Chronic kidney disease, unspecified: Secondary | ICD-10-CM | POA: Diagnosis not present

## 2024-02-12 DIAGNOSIS — D631 Anemia in chronic kidney disease: Secondary | ICD-10-CM | POA: Diagnosis not present

## 2024-02-14 DIAGNOSIS — I10 Essential (primary) hypertension: Secondary | ICD-10-CM | POA: Diagnosis not present

## 2024-02-14 DIAGNOSIS — I5032 Chronic diastolic (congestive) heart failure: Secondary | ICD-10-CM | POA: Diagnosis not present

## 2024-02-14 DIAGNOSIS — E1165 Type 2 diabetes mellitus with hyperglycemia: Secondary | ICD-10-CM | POA: Diagnosis not present

## 2024-02-14 DIAGNOSIS — Z299 Encounter for prophylactic measures, unspecified: Secondary | ICD-10-CM | POA: Diagnosis not present

## 2024-02-15 DIAGNOSIS — E1122 Type 2 diabetes mellitus with diabetic chronic kidney disease: Secondary | ICD-10-CM | POA: Diagnosis not present

## 2024-02-15 DIAGNOSIS — N1832 Chronic kidney disease, stage 3b: Secondary | ICD-10-CM | POA: Diagnosis not present

## 2024-02-15 DIAGNOSIS — I5032 Chronic diastolic (congestive) heart failure: Secondary | ICD-10-CM | POA: Diagnosis not present

## 2024-02-15 DIAGNOSIS — Z940 Kidney transplant status: Secondary | ICD-10-CM | POA: Diagnosis not present

## 2024-02-27 DIAGNOSIS — H35031 Hypertensive retinopathy, right eye: Secondary | ICD-10-CM | POA: Diagnosis not present

## 2024-03-05 DIAGNOSIS — I10 Essential (primary) hypertension: Secondary | ICD-10-CM | POA: Diagnosis not present

## 2024-03-05 DIAGNOSIS — I5032 Chronic diastolic (congestive) heart failure: Secondary | ICD-10-CM | POA: Diagnosis not present

## 2024-03-05 DIAGNOSIS — I3139 Other pericardial effusion (noninflammatory): Secondary | ICD-10-CM | POA: Diagnosis not present

## 2024-03-08 DIAGNOSIS — I5032 Chronic diastolic (congestive) heart failure: Secondary | ICD-10-CM | POA: Diagnosis not present

## 2024-03-08 DIAGNOSIS — R6889 Other general symptoms and signs: Secondary | ICD-10-CM | POA: Diagnosis not present

## 2024-03-08 DIAGNOSIS — N184 Chronic kidney disease, stage 4 (severe): Secondary | ICD-10-CM | POA: Diagnosis not present

## 2024-03-08 DIAGNOSIS — J449 Chronic obstructive pulmonary disease, unspecified: Secondary | ICD-10-CM | POA: Diagnosis not present

## 2024-03-08 DIAGNOSIS — Z299 Encounter for prophylactic measures, unspecified: Secondary | ICD-10-CM | POA: Diagnosis not present

## 2024-03-08 DIAGNOSIS — I10 Essential (primary) hypertension: Secondary | ICD-10-CM | POA: Diagnosis not present

## 2024-03-08 DIAGNOSIS — J029 Acute pharyngitis, unspecified: Secondary | ICD-10-CM | POA: Diagnosis not present

## 2024-03-21 DIAGNOSIS — Z299 Encounter for prophylactic measures, unspecified: Secondary | ICD-10-CM | POA: Diagnosis not present

## 2024-03-21 DIAGNOSIS — N184 Chronic kidney disease, stage 4 (severe): Secondary | ICD-10-CM | POA: Diagnosis not present

## 2024-03-21 DIAGNOSIS — K529 Noninfective gastroenteritis and colitis, unspecified: Secondary | ICD-10-CM | POA: Diagnosis not present

## 2024-03-21 DIAGNOSIS — I10 Essential (primary) hypertension: Secondary | ICD-10-CM | POA: Diagnosis not present

## 2024-03-21 DIAGNOSIS — E119 Type 2 diabetes mellitus without complications: Secondary | ICD-10-CM | POA: Diagnosis not present

## 2024-03-21 DIAGNOSIS — R112 Nausea with vomiting, unspecified: Secondary | ICD-10-CM | POA: Diagnosis not present

## 2024-04-19 DIAGNOSIS — E211 Secondary hyperparathyroidism, not elsewhere classified: Secondary | ICD-10-CM | POA: Diagnosis not present

## 2024-04-19 DIAGNOSIS — R809 Proteinuria, unspecified: Secondary | ICD-10-CM | POA: Diagnosis not present

## 2024-04-19 DIAGNOSIS — D631 Anemia in chronic kidney disease: Secondary | ICD-10-CM | POA: Diagnosis not present

## 2024-04-19 DIAGNOSIS — N189 Chronic kidney disease, unspecified: Secondary | ICD-10-CM | POA: Diagnosis not present

## 2024-04-19 DIAGNOSIS — Z940 Kidney transplant status: Secondary | ICD-10-CM | POA: Diagnosis not present

## 2024-04-23 DIAGNOSIS — R809 Proteinuria, unspecified: Secondary | ICD-10-CM | POA: Diagnosis not present

## 2024-04-23 DIAGNOSIS — E1129 Type 2 diabetes mellitus with other diabetic kidney complication: Secondary | ICD-10-CM | POA: Diagnosis not present

## 2024-04-23 DIAGNOSIS — N184 Chronic kidney disease, stage 4 (severe): Secondary | ICD-10-CM | POA: Diagnosis not present

## 2024-04-23 DIAGNOSIS — N179 Acute kidney failure, unspecified: Secondary | ICD-10-CM | POA: Diagnosis not present

## 2024-05-02 DIAGNOSIS — Z940 Kidney transplant status: Secondary | ICD-10-CM | POA: Diagnosis not present

## 2024-05-06 DIAGNOSIS — R809 Proteinuria, unspecified: Secondary | ICD-10-CM | POA: Diagnosis not present

## 2024-05-06 DIAGNOSIS — D638 Anemia in other chronic diseases classified elsewhere: Secondary | ICD-10-CM | POA: Diagnosis not present

## 2024-05-06 DIAGNOSIS — Z79621 Long term (current) use of calcineurin inhibitor: Secondary | ICD-10-CM | POA: Diagnosis not present

## 2024-05-06 DIAGNOSIS — N179 Acute kidney failure, unspecified: Secondary | ICD-10-CM | POA: Diagnosis not present

## 2024-05-10 DIAGNOSIS — E11319 Type 2 diabetes mellitus with unspecified diabetic retinopathy without macular edema: Secondary | ICD-10-CM | POA: Diagnosis not present

## 2024-05-10 DIAGNOSIS — J449 Chronic obstructive pulmonary disease, unspecified: Secondary | ICD-10-CM | POA: Diagnosis not present

## 2024-05-10 DIAGNOSIS — N184 Chronic kidney disease, stage 4 (severe): Secondary | ICD-10-CM | POA: Diagnosis not present

## 2024-05-10 DIAGNOSIS — Z299 Encounter for prophylactic measures, unspecified: Secondary | ICD-10-CM | POA: Diagnosis not present

## 2024-05-10 DIAGNOSIS — I5032 Chronic diastolic (congestive) heart failure: Secondary | ICD-10-CM | POA: Diagnosis not present

## 2024-05-10 DIAGNOSIS — Z23 Encounter for immunization: Secondary | ICD-10-CM | POA: Diagnosis not present

## 2024-05-10 DIAGNOSIS — E119 Type 2 diabetes mellitus without complications: Secondary | ICD-10-CM | POA: Diagnosis not present

## 2024-05-10 DIAGNOSIS — I10 Essential (primary) hypertension: Secondary | ICD-10-CM | POA: Diagnosis not present

## 2024-06-11 DIAGNOSIS — R112 Nausea with vomiting, unspecified: Secondary | ICD-10-CM | POA: Diagnosis not present

## 2024-06-11 DIAGNOSIS — J449 Chronic obstructive pulmonary disease, unspecified: Secondary | ICD-10-CM | POA: Diagnosis not present

## 2024-06-11 DIAGNOSIS — Z299 Encounter for prophylactic measures, unspecified: Secondary | ICD-10-CM | POA: Diagnosis not present

## 2024-06-11 DIAGNOSIS — N184 Chronic kidney disease, stage 4 (severe): Secondary | ICD-10-CM | POA: Diagnosis not present

## 2024-08-29 NOTE — Progress Notes (Signed)
" °  prograf  4/4 Patient reports last dose taken last night at 10 pm  Intake approximately 64 ounces of fluid intake daily UOP: WNL  S/S of urinary tract infection: no symptoms reported Medication review completed: changes made to med list  Concerns today:  This past Monday started getting sick went to PCP and is on antibiotic and cough medicine.   Cindy Park, CMA  "
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
## 2024-09-10 NOTE — Progress Notes (Signed)
 [facility] at [facility]  [address]  Phone: [phone] [place], [place] [address]  Fax: [phone]  PCP: Maree Eligio BROCKS, MD Date of Service: 09/10/2024  Assessment/Plan:  Chronic HFpEF HTN HLD Overall doing very well. BP and lipids at goal. Continue management as below. -ASA 81 mg daily -atorvastatin  40 mg daily -amlodipine  5 mg BID -carvedilol  12.5 mg BID -furosemide  80 mg daily -SL NTG prn  Return to clinic: Return in about 1 year (around 09/10/2025).  I personally spent 35 minutes face-to-face and non-face-to-face in the care of this patient, which includes all pre, intra, and post visit time on the date of service.  Franky FELIX Gil, MD, [facility], [facility] Assistant Professor of Medicine Division of Cardiology [employer] [facility]  Subjective:  Patient ID: Cindy Park is an 69 y.o. female patient with chronic HFpEF, HTN, HLD, prior pericardial effusion, COPD, prior tobacco use, DM2, ESRD s/p kidney transplant, here for follow-up.  History of Present Illness Last seen by Dr. Eloy on 03/05/2024. She is overall doing well. She has no current dyspnea, or leg swelling. She occasionally has chest discomfort that is relieved by sublingual nitroglycerin.  She takes daily morning Lasix  for fluid control and takes aspirin  and atorvastatin . She checks her blood pressure daily and sometimes changes her medication intake when her blood pressure is low.  Recent labs, including sodium, potassium, and creatinine, were checked by the transplant team and are within her usual range. She has had a renal transplant.  Objective:  Vital Signs BP 129/68 (BP Site: R Arm, BP Position: Sitting, BP Cuff Size: Large)   Pulse 83   Temp 36.2 C (97.2 F) (Temporal)   Resp 16   Ht 170.2 cm (5' 7)   Wt 95.3 kg (210 lb)   SpO2 96%   BMI 32.89 kg/m  Physical Exam GENERAL: No acute distress CHEST: Clear to auscultation bilaterally, no  wheezes or crackles CARDIOVASCULAR: Regular rate and rhythm, no murmurs, rubs, or gallops  Results Labs CMP (08/29/2024): Sodium 137, potassium 4.3, creatinine 2.04 Lipid panel (03/29/2023): Total cholesterol 135, LDL 64, HDL 46, triglycerides 167  Diagnostic EKG (09/10/2024): Normal sinus rhythm with anterior infarct pattern (Independently interpreted)  The 10-year ASCVD risk score (Arnett DK, et al., 2019) is: 16.9%

## 2024-09-20 ENCOUNTER — Encounter: Payer: Self-pay | Admitting: *Deleted
